# Patient Record
Sex: Female | Born: 1950 | Race: White | Hispanic: No | Smoking: Never smoker
Health system: Southern US, Community
[De-identification: ages and names within clinical notes are randomized; demographics above are authoritative.]

## PROBLEM LIST (undated history)

## (undated) DIAGNOSIS — R7303 Prediabetes: Secondary | ICD-10-CM

## (undated) DIAGNOSIS — I10 Essential (primary) hypertension: Secondary | ICD-10-CM

## (undated) DIAGNOSIS — E785 Hyperlipidemia, unspecified: Secondary | ICD-10-CM

## (undated) HISTORY — PX: TONSILLECTOMY: SUR1361

## (undated) HISTORY — DX: Essential (primary) hypertension: I10

---

## 2012-10-10 ENCOUNTER — Emergency Department: Payer: Self-pay | Admitting: Emergency Medicine

## 2012-10-10 LAB — BASIC METABOLIC PANEL
Anion Gap: 8 (ref 7–16)
BUN: 12 mg/dL (ref 7–18)
Calcium, Total: 8.9 mg/dL (ref 8.5–10.1)
Co2: 26 mmol/L (ref 21–32)
EGFR (African American): >60
Osmolality: 280 (ref 275–301)
Potassium: 3.5 mmol/L (ref 3.5–5.1)

## 2012-10-10 LAB — CBC WITH DIFFERENTIAL/PLATELET
Basophil #: 0 10*3/uL (ref 0.0–0.1)
Basophil %: 0.3 %
Eosinophil %: 0.1 %
HCT: 34.2 % — ABNORMAL LOW (ref 35.0–47.0)
Lymphocyte #: 1.3 10*3/uL (ref 1.0–3.6)
Lymphocyte %: 45.9 %
MCH: 29.4 pg (ref 26.0–34.0)
MCHC: 34.6 g/dL (ref 32.0–36.0)
MCV: 85 fL (ref 80–100)
Monocyte #: 0.4 x10 3/mm (ref 0.2–0.9)
Monocyte %: 14.9 %
Platelet: 171 10*3/uL (ref 150–440)
RDW: 15.6 % — ABNORMAL HIGH (ref 11.5–14.5)

## 2014-06-01 NOTE — Consult Note (Signed)
Brief Consult Note: Diagnosis: rt ankle ulcer.   Patient was seen by consultant.   Consult note dictated.   Recommend further assessment or treatment.   Discussed with Attending MD.   Comments: chronic ulcer, probable venous stasis no evidence of active infection, possibly colonized no need for antibiotics rec silvadene dressing BID, see later in week unless admitted for w/u of neutropenia needs IM w/u for leukopenia.  Electronic Signatures: Lattie Hawooper, Lalita Ebel E (MD)  (Signed 01-Sep-14 19:45)  Authored: Brief Consult Note   Last Updated: 01-Sep-14 19:45 by Lattie Hawooper, Maleea Camilo E (MD)

## 2014-06-01 NOTE — Consult Note (Signed)
PATIENT NAME:  Erika Perry, Erika Perry MR#:  [id number] DATE OF BIRTH:  1950/03/29  DATE OF CONSULTATION:  10/10/2012  CONSULTING PHYSICIAN:  Adah Salvageichard E. Excell Seltzerooper, MD  CHIEF COMPLAINT: Right ankle wound.   HISTORY OF PRESENT ILLNESS: This is a 64 year old female patient with a BMI of 37, who presents with over one month of a right ankle wound. When asked why she came in today, it was because her family insisting on her coming to the ER for evaluation of this chronic wound. She denies any fevers or chills, has not had any pain associated with this and was placed on both Keflex and Bactrim by an urgent care visit earlier in the week. She has finished those antibiotics and has seen no improvement.   The Emergency Room physician was concerned about an active infection and leukopenia. The patient does not see a physician regularly and therefore has no reported medical problems and does not take any medicines other than the antibiotics that she just completed. She is nondiabetic, but again does not see physicians regularly.   ALLERGIES: None.   MEDICATIONS: Recent completion of a regimen of Keflex and Bactrim.   FAMILY HISTORY: Noncontributory.   SOCIAL HISTORY: The patient lives at home.   REVIEW OF SYSTEMS:  A 10-system review was performed and negative with the exception of that mentioned in the history of present illness.   PHYSICAL EXAMINATION: GENERAL: Healthy, comfortable-appearing obese female patient, BMI 37 200 pounds, 62 inches tall.  VITAL SIGNS: Temperature of 98, pulse 77, respirations 16, blood pressure 160/76. The ER of notes suggested pain scale of five; however, she did not report any pain to me.  HEENT: No scleral icterus.  INTEGUMENT: No jaundice.  EXTREMITIES: Show multiple venous stasis changes with varicosities on both legs, edema on both legs, very symmetric in nature, nontender calves. Right lower extremity demonstrates approximately 4 x 5 cm granulating, chronic appearing wound on  the medial portion posterior to the ankle. It is superficial. There is no purulence. No erythema except right at the wound edge. No sign of cellulitis, nontender. Granulation is present and no purulence is noted. Full range of motion was noted.   LABORATORY DATA:  Electrolytes are within normal limits. Hemoglobin and hematocrit is 11.8 and 34.2. White blood cell count of 2.9 and a platelet count of 171.   ASSESSMENT AND PLAN: This is a patient with a chronic appearing wound in her right ankle. It has been at least a month with no preceding event. She is not sure of ever having been exposed to an arthropod bite and this is in all likelihood represents a venous stasis ulcer. There is no sign of cellulitis or active infection and I would not recommend continuing antibiotics at this point, as she has finished out a regimen of both Bactrim and Keflex. Currently I see no evidence for an active infection and would treat this with Silvadene b.i.Perry. and then re-evaluate the wound in the office next week.   However, the patient has a leukopenia on her evaluation today and a mild anemia as well. I spoke with the Emergency Room physician concerning work-up of this leukopenia. The patient does not see physicians and does not have insurance. Therefore, the concern is that this would never be properly evaluated and the Emergency Room physician will consider admission for work-up versus outpatient evaluation. Currently, she has no need for admission to the hospital with respect to the chronic granulating wound without sign of active infection. We will be  happy to see the patient in the office later on in the week to re-evaluate. This likely represents a chronic venous stasis ulcer, although there is no way to prove that this did not occur following an arthropod bite either. I expect this to take a very long time to heal if it is a chronic venous stasis ulcer, and she has considerable evidence of venous stasis changes in both  lower extremities with her obesity.   I discussed this with the Emergency Room physician. He will determine whether or not to work-up in the hospital or as an outpatient concerning her leukopenia and I reminded him and the patient and her family that I am not an internal medicine specialist in that work-up of her leukopenia would be outside of my area of expertise, but would be happy to take care of her wound as an outpatient if necessary or in the hospital if she is admitted. This plan was agreed upon by the patient as well as the Emergency Room physician.   ____________________________ Adah Salvage. Excell Seltzer, MD rec:cc Perry: 10/10/2012 19:52:56 ET T: 10/10/2012 20:35:00 ET JOB#: [id number]  cc: Adah Salvage. Excell Seltzer, MD, <Dictator> Lattie Haw MD ELECTRONICALLY SIGNED 10/10/2012 22:32

## 2016-02-28 ENCOUNTER — Emergency Department: Payer: Self-pay

## 2016-02-28 ENCOUNTER — Emergency Department
Admission: EM | Admit: 2016-02-28 | Discharge: 2016-02-28 | Disposition: A | Discharge status: Home / Self Care | Payer: Self-pay | Attending: Emergency Medicine | Admitting: Emergency Medicine

## 2016-02-28 ENCOUNTER — Encounter: Payer: Self-pay | Admitting: Emergency Medicine

## 2016-02-28 DIAGNOSIS — W010XXA Fall on same level from slipping, tripping and stumbling without subsequent striking against object, initial encounter: Secondary | ICD-10-CM | POA: Insufficient documentation

## 2016-02-28 DIAGNOSIS — Y999 Unspecified external cause status: Secondary | ICD-10-CM | POA: Insufficient documentation

## 2016-02-28 DIAGNOSIS — S83422A Sprain of lateral collateral ligament of left knee, initial encounter: Secondary | ICD-10-CM

## 2016-02-28 DIAGNOSIS — Y939 Activity, unspecified: Secondary | ICD-10-CM | POA: Insufficient documentation

## 2016-02-28 DIAGNOSIS — Y92481 Parking lot as the place of occurrence of the external cause: Secondary | ICD-10-CM | POA: Insufficient documentation

## 2016-02-28 DIAGNOSIS — S82832A Other fracture of upper and lower end of left fibula, initial encounter for closed fracture: Secondary | ICD-10-CM | POA: Insufficient documentation

## 2016-02-28 MED ORDER — HYDROCODONE-ACETAMINOPHEN 5-325 MG PO TABS
1.0000 | ORAL_TABLET | Freq: Once | ORAL | Status: AC
  Administered 2016-02-28: 1 via ORAL

## 2016-02-28 MED ORDER — HYDROCODONE-ACETAMINOPHEN 5-325 MG PO TABS
ORAL_TABLET | ORAL | Status: AC
  Filled 2016-02-28: qty 1

## 2016-02-28 MED ORDER — HYDROCODONE-ACETAMINOPHEN 5-325 MG PO TABS: 1.0000 | ORAL_TABLET | Freq: Four times a day (QID) | ORAL | 0 refills | Status: DC | PRN

## 2016-02-28 NOTE — ED Provider Notes (Signed)
[facility] Emergency Department Provider Note ____________________________________________  Time seen: 1724  I have reviewed the triage vital signs and the nursing notes.  HISTORY  Chief Complaint  Fall and Leg Pain  HPI Erika Perry is a 66 y.o. female presents to the ED for evaluation of lateral left knee pain s/p fall in her icy parking lot this morning. She worked the day, but noted increased pain with standing and walking. She localizes pain to the lateral joint line. She denies any other injury at this time. She has not taken any medicine for pain relief.   History reviewed. No pertinent past medical history.  There are no active problems to display for this patient.  No past surgical history on file.  Prior to Admission medications   Medication Sig Start Date End Date Taking? Authorizing Provider  HYDROcodone-acetaminophen (NORCO) 5-325 MG tablet Take 1 tablet by mouth every 6 (six) hours as needed. 02/28/16   Charlesetta Ivory Kamorie Aldous, PA-C    Allergies Patient has no known allergies.  No family history on file.  Social History Social History  Substance Use Topics  . Smoking status: Never Smoker  . Smokeless tobacco: Never Used  . Alcohol use No    Review of Systems  Constitutional: Negative for fever. Eyes: Negative for visual changes. ENT: Negative for sore throat. Cardiovascular: Negative for chest pain. Respiratory: Negative for shortness of breath. Gastrointestinal: Negative for abdominal pain, vomiting and diarrhea. Genitourinary: Negative for dysuria. Musculoskeletal: Negative for back pain. Skin: Negative for rash. Neurological: Negative for headaches, focal weakness or numbness. ____________________________________________  PHYSICAL EXAM:  VITAL SIGNS: ED Triage Vitals  Enc Vitals Group     BP 02/28/16 1556 (!) 182/78     Pulse Rate 02/28/16 1556 97     Resp 02/28/16 1556 20     Temp 02/28/16 1556 98.2 F (36.8 C)     Temp Source 02/28/16 1556 Oral     SpO2 02/28/16 1556 100 %     Weight 02/28/16 1554 160 lb (72.6 kg)     Height 02/28/16 1554 5\' 2"  (1.575 m)     Head Circumference --      Peak Flow --      Pain Score 02/28/16 1554 8     Pain Loc --      Pain Edu? --      Excl. in GC? --     Constitutional: Alert and oriented. Well appearing and in no distress. Head: Normocephalic and atraumatic. Cardiovascular: Normal rate, regular rhythm. Normal distal pulses. Respiratory: Normal respiratory effort. No wheezes/rales/rhonchi. Musculoskeletal: Left knee without obvious deformity, effusion, or dislocation. Patient with tenderness to palp at the lateral joint line. She is exquisitely tender to palp over the proximal fibular head. Normal ankle ROM without laxity. No popliteal space fullness. No calf or achilles tenderness. Nontender with normal range of motion in all extremities.  Neurologic:  Normal gait without ataxia. Normal speech and language. No gross focal neurologic deficits are appreciated. Skin:  Skin is warm, dry and intact. No rash noted. Psychiatric: Mood and affect are normal. Patient exhibits appropriate insight and judgment. ____________________________________________   RADIOLOGY  Left Knee IMPRESSION: Minimally displaced fracture of the proximal fibula.  I, Jessicaann Overbaugh, Charlesetta Ivory, personally viewed and evaluated these images (plain radiographs) as part of my medical decision making, as well as reviewing the written report by the radiologist. ____________________________________________  PROCEDURES  Knee immobilizer Jones compression wrap Norco 5-325 mg PO ____________________________________________  INITIAL  IMPRESSION / ASSESSMENT AND PLAN / ED COURSE  Patient with initial fracture care of a minimally displaced proximal fibula fracture. She is fitted with a compression wrap and knee immobilizer. She will be discharged with a prescription for Norco for pain relief. She may  take ibuprofen or naproxen for non-drowsy pain relief. Follow-up with Dr. Ernest PineHooten for further fracture management.   ____________________________________________  FINAL CLINICAL IMPRESSION(S) / ED DIAGNOSES  Final diagnoses:  Closed fracture fibula, head, left, initial encounter  Sprain of lateral collateral ligament of left knee, initial encounter      Lissa HoardJenise V Bacon Minnie Shi, PA-C 02/28/16 1836    Arnaldo NatalPaul F Malinda, MD 03/02/16 0127

## 2016-02-28 NOTE — ED Notes (Signed)
See triage note  States she slipped  Landed on left leg  Having pain to lateral aspect on left knee

## 2016-02-28 NOTE — Discharge Instructions (Signed)
Wear the knee immobilizer to ambulate. Rest with the leg elevated and apply ice to reduce pain and swelling. Take the pain medicine as directed. Take OTC ibuprofen or naproxen for pain and swelling. Follow-up with Dr. Ernest PineHooten for further fracture care next week.

## 2016-02-28 NOTE — ED Triage Notes (Signed)
Pt here with c/o left leg pain after a fall on ice today. Pt ambulatory to triage, refusing wheelchair.

## 2017-02-17 DIAGNOSIS — I10 Essential (primary) hypertension: Secondary | ICD-10-CM | POA: Diagnosis not present

## 2017-02-17 DIAGNOSIS — L97509 Non-pressure chronic ulcer of other part of unspecified foot with unspecified severity: Secondary | ICD-10-CM | POA: Diagnosis not present

## 2017-02-23 ENCOUNTER — Ambulatory Visit (INDEPENDENT_AMBULATORY_CARE_PROVIDER_SITE_OTHER): Payer: Medicare Other | Admitting: Vascular Surgery

## 2017-02-23 ENCOUNTER — Encounter (INDEPENDENT_AMBULATORY_CARE_PROVIDER_SITE_OTHER): Payer: Self-pay | Admitting: Vascular Surgery

## 2017-02-23 VITALS — BP 176/93 | HR 84 | Resp 16 | Ht 61.0 in | Wt 196.0 lb

## 2017-02-23 DIAGNOSIS — R600 Localized edema: Secondary | ICD-10-CM | POA: Insufficient documentation

## 2017-02-23 DIAGNOSIS — I10 Essential (primary) hypertension: Secondary | ICD-10-CM | POA: Insufficient documentation

## 2017-02-23 DIAGNOSIS — L97321 Non-pressure chronic ulcer of left ankle limited to breakdown of skin: Secondary | ICD-10-CM | POA: Diagnosis not present

## 2017-02-23 DIAGNOSIS — I83023 Varicose veins of left lower extremity with ulcer of ankle: Secondary | ICD-10-CM | POA: Diagnosis not present

## 2017-02-23 NOTE — Progress Notes (Signed)
Subjective:    Patient ID: Erika Perry, female    DOB: March 14, 1950, 67 y.o.   MRN: [id number] Chief Complaint  Patient presents with  . New Patient (Initial Visit)    Possible leg ulcer   Patient last seen in 2015 and treated for a right lower extremity medial ankle ulceration.  The patient endorses a history of undergoing weekly Unna boot changes for an ulcer to the right ankle.  The patient noted a small wound developing to the medial aspect of her left ankle around Christmas.  She has been treating it with Neosporin.  The wound has grown in size over the last two weeks and the patient is seeking medical attention.  At this time, the patient does not engage in conservative therapy including wearing medical grade one compression stockings and elevating her legs on a daily basis.  The patient denies any trauma or recent surgery to the area.  The patient denies any DVT history.  The patient does experience bilateral lower extremity edema.  Patient denies any drainage from the ulceration.  The patient denies any fever, nausea vomiting.   Review of Systems  Constitutional: Negative.   HENT: Negative.   Eyes: Negative.   Respiratory: Negative.   Cardiovascular: Positive for leg swelling.  Gastrointestinal: Negative.   Endocrine: Negative.   Genitourinary: Negative.   Musculoskeletal: Negative.   Skin: Positive for wound.  Allergic/Immunologic: Negative.   Neurological: Negative.   Hematological: Negative.   Psychiatric/Behavioral: Negative.       Objective:   Physical Exam  Constitutional: She is oriented to person, place, and time. She appears well-developed and well-nourished. No distress.  HENT:  Head: Normocephalic and atraumatic.  Eyes: Conjunctivae are normal. Pupils are equal, round, and reactive to light.  Neck: Normal range of motion.  Cardiovascular: Normal rate, regular rhythm, normal heart sounds and intact distal pulses.  Pulses:      Radial pulses are 2+ on the  right side, and 2+ on the left side.  Hard to palpate pedal pulses due to edema however her bilateral feet are warm  Pulmonary/Chest: Effort normal and breath sounds normal.  Musculoskeletal: Normal range of motion. She exhibits edema (Mild to moderate 1+ pitting edema noted bilaterally left greater than right.).  Neurological: She is alert and oriented to person, place, and time.  Skin: She is not diaphoretic.  3 cm x 3 cm shallow noninfected ulceration limited to breakdown of skin noted to the left medial ankle.  No drainage.  No cellulitis.  Surrounding skin is healthy.  No necrotic tissue.  Psychiatric: She has a normal mood and affect. Her behavior is normal. Judgment and thought content normal.  Vitals reviewed.  BP (!) 176/93 (BP Location: Right Arm, Patient Position: Sitting)   Pulse 84   Resp 16   Ht 5\' 1"  (1.549 m)   Wt 196 lb (88.9 kg)   BMI 37.03 kg/m   Past Medical History:  Diagnosis Date  . Hypertension    Social History   Socioeconomic History  . Marital status: Single    Spouse name: Not on file  . Number of children: Not on file  . Years of education: Not on file  . Highest education level: Not on file  Social Needs  . Financial resource strain: Not on file  . Food insecurity - worry: Not on file  . Food insecurity - inability: Not on file  . Transportation needs - medical: Not on file  . Transportation needs -  non-medical: Not on file  Occupational History  . Not on file  Tobacco Use  . Smoking status: Never Smoker  . Smokeless tobacco: Never Used  Substance and Sexual Activity  . Alcohol use: No  . Drug use: Not on file  . Sexual activity: Not on file  Other Topics Concern  . Not on file  Social History Narrative  . Not on file   History reviewed. No pertinent surgical history.  Family History  Problem Relation Age of Onset  . Cancer Mother   . Diabetes Mother    Allergies  Allergen Reactions  . Bactrim [Sulfamethoxazole-Trimethoprim]      Nausea, no appitite      Assessment & Plan:  Patient last seen in 2015 and treated for a right lower extremity medial ankle ulceration.  The patient endorses a history of undergoing weekly Unna boot changes for an ulcer to the right ankle.  The patient noted a small wound developing to the medial aspect of her left ankle around Christmas.  She has been treating it with Neosporin.  The wound has grown in size over the last two weeks and the patient is seeking medical attention.  At this time, the patient does not engage in conservative therapy including wearing medical grade one compression stockings and elevating her legs on a daily basis.  The patient denies any trauma or recent surgery to the area.  The patient denies any DVT history.  The patient does experience bilateral lower extremity edema.  Patient denies any drainage from the ulceration.  The patient denies any fever, nausea vomiting.  1. Essential hypertension - Stable Encouraged good control as its slows the progression of atherosclerotic disease  2. 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 The patient has not been engaging in conservative therapy and presents today with a left medial ankle ulceration. Recommend Unna wraps to the bilateral lower extremity to control the patient's edema and assist in healing The patient was encouraged to elevate her legs heart level or higher Patient is to follow-up in 1 month to assess her progress with bilateral Unna boots The patient understands that once her her edema is controlled I will transition her from Unna boots to compression stockings The patient is to undergo bilateral venous duplex to rule out any contributing venous disease in 1 month  - VAS US LOWER EXTREMITY VENOUS REFLUX; Future  3. Bilateral lower extremity  edema - New As above  Current Outpatient Medications on File Prior to Visit  Medication Sig Dispense Refill  . HYDROcodone-acetaminophen (NORCO) 5-325 MG tablet Take 1 tablet by mouth every 6 (six) hours as needed. (Patient not taking: Reported on 02/23/2017) 15 tablet 0  . losartan (COZAAR) 50 MG tablet TK 1 T PO D  0  . traMADol (ULTRAM) 50 MG tablet Take by mouth.     No current facility-administered medications on file prior to visit.    There are no Patient Instructions on file for this visit. Return in about 1 month (around 03/26/2017), or if symptoms worsen or fail to improve.  Kimani Bedoya A Tanyiah Laurich, PA-C

## 2017-02-25 ENCOUNTER — Other Ambulatory Visit (INDEPENDENT_AMBULATORY_CARE_PROVIDER_SITE_OTHER): Payer: Self-pay

## 2017-02-25 ENCOUNTER — Telehealth (INDEPENDENT_AMBULATORY_CARE_PROVIDER_SITE_OTHER): Payer: Self-pay

## 2017-02-25 MED ORDER — TRAMADOL HCL 50 MG PO TABS: 50.0000 mg | ORAL_TABLET | Freq: Two times a day (BID) | ORAL | 0 refills | Status: DC

## 2017-02-25 NOTE — Telephone Encounter (Signed)
Tramadol 50mg  1 tab BID #40 with no additional refills was sent into the patient's pharmacy electronically today.

## 2017-02-25 NOTE — Telephone Encounter (Signed)
Patient is calling to see if there is something that can be called in for her left foot pain?    [facility] in [place].

## 2017-02-25 NOTE — Telephone Encounter (Signed)
As I discussed with Erika Perry - she can have tramadol. She is already taking this as per her med rec. We do not prescribe narcotics for unna wraps. If she wants narcotics she would have to ask her PCP.

## 2017-03-01 ENCOUNTER — Telehealth (INDEPENDENT_AMBULATORY_CARE_PROVIDER_SITE_OTHER): Payer: Self-pay

## 2017-03-01 NOTE — Telephone Encounter (Signed)
Patient called to see if we had ordered a refill on her pain medications yet?  The refill was done on 02/25/17 [facility] [place]  She was going to call to see if they had gotten the refill.

## 2017-03-02 ENCOUNTER — Ambulatory Visit (INDEPENDENT_AMBULATORY_CARE_PROVIDER_SITE_OTHER): Payer: Medicare Other | Admitting: Vascular Surgery

## 2017-03-02 ENCOUNTER — Encounter (INDEPENDENT_AMBULATORY_CARE_PROVIDER_SITE_OTHER): Payer: Self-pay | Admitting: Vascular Surgery

## 2017-03-02 VITALS — BP 152/87 | HR 80 | Resp 16

## 2017-03-02 DIAGNOSIS — L97321 Non-pressure chronic ulcer of left ankle limited to breakdown of skin: Secondary | ICD-10-CM

## 2017-03-02 DIAGNOSIS — R600 Localized edema: Secondary | ICD-10-CM | POA: Diagnosis not present

## 2017-03-02 DIAGNOSIS — I83023 Varicose veins of left lower extremity with ulcer of ankle: Secondary | ICD-10-CM

## 2017-03-02 NOTE — Progress Notes (Signed)
History of Present Illness  There is no documented history at this time  Assessments & Plan   There are no diagnoses linked to this encounter.    Additional instructions  Subjective:  Patient presents with venous ulcer of the Left lower extremity.    Procedure:  3 layer unna wrap was placed Left lower extremity.   Plan:   Follow up in one week.  

## 2017-03-09 ENCOUNTER — Ambulatory Visit (INDEPENDENT_AMBULATORY_CARE_PROVIDER_SITE_OTHER): Payer: Medicare Other | Admitting: Vascular Surgery

## 2017-03-09 ENCOUNTER — Encounter (INDEPENDENT_AMBULATORY_CARE_PROVIDER_SITE_OTHER): Payer: Self-pay

## 2017-03-09 VITALS — BP 151/75 | HR 86 | Resp 16 | Wt 194.0 lb

## 2017-03-09 DIAGNOSIS — L97321 Non-pressure chronic ulcer of left ankle limited to breakdown of skin: Secondary | ICD-10-CM | POA: Diagnosis not present

## 2017-03-09 DIAGNOSIS — I83023 Varicose veins of left lower extremity with ulcer of ankle: Secondary | ICD-10-CM

## 2017-03-09 NOTE — Progress Notes (Signed)
History of Present Illness  There is no documented history at this time  Assessments & Plan   There are no diagnoses linked to this encounter.    Additional instructions  Subjective:  Patient presents with venous ulcer of the Bilateral lower extremity.    Procedure:  3 layer unna wrap was placed Bilateral lower extremity.   Plan:   Follow up in one week.  

## 2017-03-10 ENCOUNTER — Encounter (INDEPENDENT_AMBULATORY_CARE_PROVIDER_SITE_OTHER): Payer: Medicare Other

## 2017-03-16 ENCOUNTER — Ambulatory Visit (INDEPENDENT_AMBULATORY_CARE_PROVIDER_SITE_OTHER): Payer: Medicare Other | Admitting: Vascular Surgery

## 2017-03-16 ENCOUNTER — Encounter (INDEPENDENT_AMBULATORY_CARE_PROVIDER_SITE_OTHER): Payer: Self-pay

## 2017-03-16 VITALS — BP 154/90 | HR 75 | Resp 17 | Ht 66.0 in | Wt 193.0 lb

## 2017-03-16 DIAGNOSIS — I83023 Varicose veins of left lower extremity with ulcer of ankle: Secondary | ICD-10-CM

## 2017-03-16 DIAGNOSIS — L97321 Non-pressure chronic ulcer of left ankle limited to breakdown of skin: Secondary | ICD-10-CM | POA: Diagnosis not present

## 2017-03-16 NOTE — Progress Notes (Signed)
History of Present Illness  There is no documented history at this time  Assessments & Plan   There are no diagnoses linked to this encounter.    Additional instructions  Subjective:  Patient presents with venous ulcer of the Bilateral lower extremity.    Procedure:  3 layer unna wrap was placed Bilateral lower extremity.   Plan:   Follow up in one week.  

## 2017-03-17 ENCOUNTER — Encounter (INDEPENDENT_AMBULATORY_CARE_PROVIDER_SITE_OTHER): Payer: Self-pay | Admitting: Vascular Surgery

## 2017-03-20 ENCOUNTER — Other Ambulatory Visit (INDEPENDENT_AMBULATORY_CARE_PROVIDER_SITE_OTHER): Payer: Self-pay | Admitting: Vascular Surgery

## 2017-03-24 ENCOUNTER — Ambulatory Visit (INDEPENDENT_AMBULATORY_CARE_PROVIDER_SITE_OTHER): Payer: Medicare Other | Admitting: Vascular Surgery

## 2017-03-24 ENCOUNTER — Encounter (INDEPENDENT_AMBULATORY_CARE_PROVIDER_SITE_OTHER): Payer: Self-pay | Admitting: Vascular Surgery

## 2017-03-24 ENCOUNTER — Ambulatory Visit (INDEPENDENT_AMBULATORY_CARE_PROVIDER_SITE_OTHER): Payer: Medicare Other

## 2017-03-24 VITALS — BP 169/85 | HR 82 | Resp 18 | Ht 66.0 in | Wt 195.0 lb

## 2017-03-24 DIAGNOSIS — I83023 Varicose veins of left lower extremity with ulcer of ankle: Secondary | ICD-10-CM

## 2017-03-24 DIAGNOSIS — L97321 Non-pressure chronic ulcer of left ankle limited to breakdown of skin: Secondary | ICD-10-CM | POA: Diagnosis not present

## 2017-03-24 DIAGNOSIS — I890 Lymphedema, not elsewhere classified: Secondary | ICD-10-CM

## 2017-03-24 DIAGNOSIS — I872 Venous insufficiency (chronic) (peripheral): Secondary | ICD-10-CM | POA: Insufficient documentation

## 2017-03-24 MED ORDER — TRAMADOL HCL 50 MG PO TABS: 50.0000 mg | ORAL_TABLET | Freq: Four times a day (QID) | ORAL | 1 refills | Status: DC | PRN

## 2017-03-24 NOTE — Progress Notes (Signed)
Subjective:    Patient ID: Erika Perry, female    DOB: [birth year], 67 y.o.   MRN: [id number] Chief Complaint  Patient presents with  . Follow-up    Unna check and bilateral venous reflux study   Patient presents to review vascular studies.  The patient has been receiving bilateral three layer zinc oxide unna wraps for lower extremity edema with medial ankle ulceration.  There has been minimal improvement to her edema.  The ulceration located to the left medial ankle has healed.  The ulceration to the right medial ankle continues to heal.  The patient notes discomfort to the bilateral medial ankles while in a Unna wraps.  The patient states that she elevates her legs heart level or higher on a daily basis.  The patient underwent a bilateral lower venous reflux study which was notable for reflux in the right popliteal vein, right greater saphenous vein, and right small saphenous vein.  There is no evidence of deep vein or superficial thrombophlebitis to the right lower extremity.  The patient was noted to have reflux in the left popliteal vein, left great saphenous vein and left small saphenous vein.  There is no evidence of deep vein or superficial thrombophlebitis to the left lower extremity.  The patient denies any recent bouts of cellulitis bilaterally.  The patient denies any fever, nausea vomiting.   Review of Systems  Constitutional: Negative.   HENT: Negative.   Eyes: Negative.   Respiratory: Negative.   Cardiovascular: Positive for leg swelling.  Gastrointestinal: Negative.   Endocrine: Negative.   Genitourinary: Negative.   Musculoskeletal: Negative.   Skin: Positive for wound.  Allergic/Immunologic: Negative.   Neurological: Negative.   Hematological: Negative.   Psychiatric/Behavioral: Negative.       Objective:   Physical Exam  Constitutional: She is oriented to person, place, and time. She appears well-developed and well-nourished. No distress.  HENT:  Head:  Normocephalic and atraumatic.  Eyes: Conjunctivae are normal. Pupils are equal, round, and reactive to light.  Neck: Normal range of motion.  Cardiovascular: Normal rate, regular rhythm, normal heart sounds and intact distal pulses.  Pulses:      Radial pulses are 2+ on the right side, and 2+ on the left side.  To palpate pedal pulses however the bilateral feet are warm  Pulmonary/Chest: Effort normal and breath sounds normal.  Musculoskeletal: Normal range of motion. She exhibits edema (Mild to moderate nonpitting edema noted bilaterally).  Neurological: She is alert and oriented to person, place, and time.  Skin: She is not diaphoretic.  Medial ankle: 2 cm x 2 cm shallow noninfected ulceration.  There is no surrounding erythema.  There is no cellulitis to the area.  There is no drainage.  Psychiatric: She has a normal mood and affect. Her behavior is normal. Judgment and thought content normal.  Vitals reviewed.  BP (!) 169/85 (BP Location: Right Arm, Patient Position: Sitting)   Pulse 82   Resp 18   Ht 5\' 6"  (1.676 m)   Wt 195 lb (88.5 kg)   BMI 31.47 kg/m   Past Medical History:  Diagnosis Date  . Hypertension    Social History   Socioeconomic History  . Marital status: Single    Spouse name: Not on file  . Number of children: Not on file  . Years of education: Not on file  . Highest education level: Not on file  Social Needs  . Financial resource strain: Not on file  . Food insecurity -  worry: Not on file  . Food insecurity - inability: Not on file  . Transportation needs - medical: Not on file  . Transportation needs - non-medical: Not on file  Occupational History  . Not on file  Tobacco Use  . Smoking status: Never Smoker  . Smokeless tobacco: Never Used  Substance and Sexual Activity  . Alcohol use: No  . Drug use: Not on file  . Sexual activity: Not on file  Other Topics Concern  . Not on file  Social History Narrative  . Not on file   History  reviewed. No pertinent surgical history.  Family History  Problem Relation Age of Onset  . Cancer Mother   . Diabetes Mother    Allergies  Allergen Reactions  . Bactrim [Sulfamethoxazole-Trimethoprim]     Nausea, no appitite      Assessment & Plan:  Patient presents to review vascular studies.  The patient has been receiving bilateral three layer zinc oxide unna wraps for lower extremity edema with medial ankle ulceration.  There has been minimal improvement to her edema.  The ulceration located to the left medial ankle has healed.  The ulceration to the right medial ankle continues to heal.  The patient notes discomfort to the bilateral medial ankles while in a Unna wraps.  The patient states that she elevates her legs heart level or higher on a daily basis.  The patient underwent a bilateral lower venous reflux study which was notable for reflux in the right popliteal vein, right greater saphenous vein, and right small saphenous vein.  There is no evidence of deep vein or superficial thrombophlebitis to the right lower extremity.  The patient was noted to have reflux in the left popliteal vein, left great saphenous vein and left small saphenous vein.  There is no evidence of deep vein or superficial thrombophlebitis to the left lower extremity.  The patient denies any recent bouts of cellulitis bilaterally.  The patient denies any fever, nausea vomiting.  1. 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 Chronic venous insufficiency -  New Patient with bilateral lower extremity venous reflux The patient is likely to benefit from endovenous laser ablation. I have discussed the risks and benefits of the procedure. The risks primarily include DVT, recanalization, bleeding, infection, and inability to gain access. I will bring the patient back at the three-month mark to discuss possibly moving forward with laser ablation  3. Lymphedema - New Despite conservative treatments including exercise, elevation and bilateral 3 layer zinc oxide Unna wraps the patient still presents with stage II lymphedema The patient would greatly benefit from the added therapy of a lymphedema pump Applied to the patient's insurance The patient is to continue undergoing weekly bilateral zinc oxide Unna wraps Patient to follow-up in 1 month  Current Outpatient Medications on File Prior to Visit  Medication Sig Dispense Refill  . HYDROcodone-acetaminophen (NORCO) 5-325 MG tablet Take 1 tablet by mouth every 6 (six) hours as needed. (Patient not taking: Reported on 02/23/2017) 15 tablet 0  . losartan (COZAAR) 50  MG tablet TK 1 T PO D  0  . traMADol (ULTRAM) 50 MG tablet Take 1 tablet (50 mg total) by mouth 2 (two) times daily. 40 tablet 0   No current facility-administered medications on file prior to visit.    There are no Patient Instructions on file for this visit. No Follow-up on file.  Morry Veiga A Axyl Sitzman, PA-C

## 2017-03-31 ENCOUNTER — Ambulatory Visit (INDEPENDENT_AMBULATORY_CARE_PROVIDER_SITE_OTHER): Payer: Medicare Other | Admitting: Vascular Surgery

## 2017-03-31 ENCOUNTER — Encounter (INDEPENDENT_AMBULATORY_CARE_PROVIDER_SITE_OTHER): Payer: Self-pay

## 2017-03-31 VITALS — BP 184/90 | HR 90 | Resp 17 | Wt 195.0 lb

## 2017-03-31 DIAGNOSIS — I83023 Varicose veins of left lower extremity with ulcer of ankle: Secondary | ICD-10-CM

## 2017-03-31 DIAGNOSIS — L97321 Non-pressure chronic ulcer of left ankle limited to breakdown of skin: Secondary | ICD-10-CM | POA: Diagnosis not present

## 2017-03-31 NOTE — Progress Notes (Signed)
History of Present Illness  There is no documented history at this time  Assessments & Plan   There are no diagnoses linked to this encounter.    Additional instructions  Subjective:  Patient presents with venous ulcer of the Bilateral lower extremity.    Procedure:  3 layer unna wrap was placed Bilateral lower extremity.   Plan:   Follow up in one week.  

## 2017-04-07 ENCOUNTER — Encounter (INDEPENDENT_AMBULATORY_CARE_PROVIDER_SITE_OTHER): Payer: Self-pay

## 2017-04-07 ENCOUNTER — Ambulatory Visit (INDEPENDENT_AMBULATORY_CARE_PROVIDER_SITE_OTHER): Payer: Medicare Other | Admitting: Vascular Surgery

## 2017-04-07 VITALS — BP 167/80 | HR 82 | Resp 17 | Wt 194.8 lb

## 2017-04-07 DIAGNOSIS — L97321 Non-pressure chronic ulcer of left ankle limited to breakdown of skin: Secondary | ICD-10-CM

## 2017-04-07 DIAGNOSIS — I872 Venous insufficiency (chronic) (peripheral): Secondary | ICD-10-CM

## 2017-04-07 DIAGNOSIS — I83023 Varicose veins of left lower extremity with ulcer of ankle: Secondary | ICD-10-CM

## 2017-04-07 DIAGNOSIS — I890 Lymphedema, not elsewhere classified: Secondary | ICD-10-CM

## 2017-04-07 NOTE — Progress Notes (Signed)
History of Present Illness  There is no documented history at this time  Assessments & Plan   There are no diagnoses linked to this encounter.    Additional instructions  Subjective:  Patient presents with venous ulcer of the Bilateral lower extremity.    Procedure:  3 layer unna wrap was placed Bilateral lower extremity.   Plan:   Follow up in one week.  

## 2017-04-14 ENCOUNTER — Ambulatory Visit (INDEPENDENT_AMBULATORY_CARE_PROVIDER_SITE_OTHER): Payer: Medicare Other | Admitting: Vascular Surgery

## 2017-04-14 ENCOUNTER — Encounter (INDEPENDENT_AMBULATORY_CARE_PROVIDER_SITE_OTHER): Payer: Self-pay

## 2017-04-14 VITALS — BP 164/86 | HR 90 | Resp 17 | Wt 194.0 lb

## 2017-04-14 DIAGNOSIS — I83023 Varicose veins of left lower extremity with ulcer of ankle: Secondary | ICD-10-CM | POA: Diagnosis not present

## 2017-04-14 DIAGNOSIS — L97321 Non-pressure chronic ulcer of left ankle limited to breakdown of skin: Secondary | ICD-10-CM | POA: Diagnosis not present

## 2017-04-14 NOTE — Progress Notes (Signed)
History of Present Illness  There is no documented history at this time  Assessments & Plan   There are no diagnoses linked to this encounter.    Additional instructions  Subjective:  Patient presents with venous ulcer of the Bilateral lower extremity.    Procedure:  3 layer unna wrap was placed Bilateral lower extremity.   Plan:   Follow up in one week.  

## 2017-04-21 ENCOUNTER — Ambulatory Visit (INDEPENDENT_AMBULATORY_CARE_PROVIDER_SITE_OTHER): Payer: Medicare Other | Admitting: Vascular Surgery

## 2017-04-21 ENCOUNTER — Encounter (INDEPENDENT_AMBULATORY_CARE_PROVIDER_SITE_OTHER): Payer: Self-pay | Admitting: Vascular Surgery

## 2017-04-21 VITALS — BP 158/76 | HR 84 | Resp 18 | Wt 194.0 lb

## 2017-04-21 DIAGNOSIS — I890 Lymphedema, not elsewhere classified: Secondary | ICD-10-CM

## 2017-04-21 DIAGNOSIS — I83023 Varicose veins of left lower extremity with ulcer of ankle: Secondary | ICD-10-CM | POA: Diagnosis not present

## 2017-04-21 DIAGNOSIS — L97321 Non-pressure chronic ulcer of left ankle limited to breakdown of skin: Secondary | ICD-10-CM | POA: Diagnosis not present

## 2017-04-21 DIAGNOSIS — I872 Venous insufficiency (chronic) (peripheral): Secondary | ICD-10-CM

## 2017-04-21 NOTE — Progress Notes (Signed)
Subjective:    Patient ID: Erika Perry, female    DOB: 07/19/1950, [age] y.o.   MRN: [id number] Chief Complaint  Patient presents with  . Follow-up    unna check   Patient presents today for a monthly Unna boot/bilateral medial ankle wound follow-up.  The patient has been in 3 layers of zinc oxide and wraps to the bilateral lower extremity for approximately 2 months due to medial ankle ulcerations.  The patient notes an improvement to her edema and slow but steady healing to the bilateral ulcerations.  The patient underwent a bilateral venous reflux duplex last month which was notable for reflux in the right popliteal vein, right greater saphenous vein, and right small saphenous vein.  The patient was noted to have reflux in the left popliteal vein, left great saphenous vein and left small saphenous vein.  The patient notes she is elevating her legs her levels are higher as much as possible.  The patient notes that she has been as active as possible.  The patient denies any fever, nausea or vomiting.   Review of Systems  Constitutional: Negative.   HENT: Negative.   Eyes: Negative.   Respiratory: Negative.   Cardiovascular: Positive for leg swelling.  Gastrointestinal: Negative.   Endocrine: Negative.   Genitourinary: Negative.   Musculoskeletal: Negative.   Skin: Positive for wound.  Allergic/Immunologic: Negative.   Neurological: Negative.   Hematological: Negative.   Psychiatric/Behavioral: Negative.       Objective:   Physical Exam  Constitutional: She is oriented to person, place, and time. She appears well-developed and well-nourished. No distress.  HENT:  Head: Normocephalic and atraumatic.  Eyes: Conjunctivae are normal. Pupils are equal, round, and reactive to light.  Neck: Normal range of motion.  Cardiovascular: Normal rate, regular rhythm, normal heart sounds and intact distal pulses.  Pulses:      Radial pulses are 2+ on the right side, and 2+ on the left side.       Dorsalis pedis pulses are 2+ on the right side, and 2+ on the left side.       Posterior tibial pulses are 2+ on the right side, and 2+ on the left side.  Pulmonary/Chest: Effort normal and breath sounds normal.  Musculoskeletal: Normal range of motion. She exhibits edema (Mild to moderate nonpitting bilateral lower extremity edema noted).  Neurological: She is alert and oriented to person, place, and time.  Skin: She is not diaphoretic.  Right medial ankle ulceration: 1 cm x 1 cm shallow healthy wound bed full of granulation tissue ulceration noted to the right medial ankle.  There is no drainage.  There is no cellulitis. Left medial ankle ulceration: Almost fully healed very small area of irritation noted.  Cellulitis to the extremity.  Psychiatric: She has a normal mood and affect. Her behavior is normal. Judgment and thought content normal.  Vitals reviewed.  BP (!) 158/76 (BP Location: Right Arm)   Pulse 84   Resp 18   Wt 194 lb (88 kg)   BMI 31.31 kg/m   Past Medical History:  Diagnosis Date  . Hypertension    Social History   Socioeconomic History  . Marital status: Single    Spouse name: Not on file  . Number of children: Not on file  . Years of education: Not on file  . Highest education level: Not on file  Social Needs  . Financial resource strain: Not on file  . Food insecurity - worry: Not on  file  . Food insecurity - inability: Not on file  . Transportation needs - medical: Not on file  . Transportation needs - non-medical: Not on file  Occupational History  . Not on file  Tobacco Use  . Smoking status: Never Smoker  . Smokeless tobacco: Never Used  Substance and Sexual Activity  . Alcohol use: No  . Drug use: Not on file  . Sexual activity: Not on file  Other Topics Concern  . Not on file  Social History Narrative  . Not on file   No past surgical history on file.  Family History  Problem Relation Age of Onset  . Cancer Mother   . Diabetes  Mother     Allergies  Allergen Reactions  . Bactrim [Sulfamethoxazole-Trimethoprim]     Nausea, no appitite      Assessment & Plan:  Patient presents today for a monthly Unna boot/bilateral medial ankle wound follow-up.  The patient has been in 3 layers of zinc oxide and wraps to the bilateral lower extremity for approximately 2 months due to medial ankle ulcerations.  The patient notes an improvement to her edema and slow but steady healing to the bilateral ulcerations.  The patient underwent a bilateral venous reflux duplex last month which was notable for reflux in the right popliteal vein, right greater saphenous vein, and right small saphenous vein.  The patient was noted to have reflux in the left popliteal vein, left great saphenous vein and left small saphenous vein.  The patient notes she is elevating her legs her levels are higher as much as possible.  The patient notes that she has been as active as possible.  The patient denies any fever, nausea or vomiting.  1. 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 The patient is encouraged to continue elevating her legs on a daily basis Patient is encouraged to remain active The patient would benefit from endovenous laser ablations of the bilateral great saphenous and small saphenous veins.  The patient is going to follow-up in 1 month to assess her progress with an additional month of Unna boot therapy.  At that time, we will discuss if she is willing to move forward with laser ablation.  We had a long discussion about it today and how it would help her and  hopefully prevent recurrent ulcerations in the future.  2. Chronic venous insufficiency - Stable As above  3. Lymphedema - Stable The patient may be a candidate for lymphedema pump in the future As above  Current Outpatient Medications on File Prior to Visit  Medication Sig Dispense Refill  . HYDROcodone-acetaminophen (NORCO) 5-325 MG tablet Take 1 tablet by mouth every 6 (six) hours as needed. 15 tablet 0  . losartan (COZAAR) 50 MG tablet TK 1 T PO D  0  . traMADol (ULTRAM) 50 MG tablet Take 1 tablet (50 mg total) by mouth every 6 (six) hours as needed for moderate pain or severe pain. 40 tablet 1  . traMADol (ULTRAM) 50 MG tablet Take 1 tablet (50 mg total) by mouth 2 (two) times daily. (Patient not taking: Reported on 04/14/2017) 40  tablet 0   No current facility-administered medications on file prior to visit.    There are no Patient Instructions on file for this visit. No Follow-up on file.  Odean Mcelwain A Dontavia Brand, PA-C

## 2017-04-28 ENCOUNTER — Ambulatory Visit (INDEPENDENT_AMBULATORY_CARE_PROVIDER_SITE_OTHER): Payer: Medicare Other | Admitting: Vascular Surgery

## 2017-04-28 ENCOUNTER — Encounter (INDEPENDENT_AMBULATORY_CARE_PROVIDER_SITE_OTHER): Payer: Self-pay

## 2017-04-28 VITALS — BP 152/84 | HR 86 | Resp 17

## 2017-04-28 DIAGNOSIS — I872 Venous insufficiency (chronic) (peripheral): Secondary | ICD-10-CM

## 2017-04-28 NOTE — Progress Notes (Signed)
History of Present Illness  There is no documented history at this time  Assessments & Plan   There are no diagnoses linked to this encounter.    Additional instructions  Subjective:  Patient presents with venous ulcer of the Bilateral lower extremity.    Procedure:  3 layer unna wrap was placed Bilateral lower extremity.   Plan:   Follow up in one week.  

## 2017-05-05 ENCOUNTER — Encounter (INDEPENDENT_AMBULATORY_CARE_PROVIDER_SITE_OTHER): Payer: Self-pay

## 2017-05-05 ENCOUNTER — Ambulatory Visit (INDEPENDENT_AMBULATORY_CARE_PROVIDER_SITE_OTHER): Payer: Medicare Other | Admitting: Vascular Surgery

## 2017-05-05 VITALS — BP 150/74 | HR 80 | Resp 14 | Ht 65.0 in | Wt 193.0 lb

## 2017-05-05 DIAGNOSIS — I83023 Varicose veins of left lower extremity with ulcer of ankle: Secondary | ICD-10-CM

## 2017-05-05 DIAGNOSIS — L97321 Non-pressure chronic ulcer of left ankle limited to breakdown of skin: Secondary | ICD-10-CM | POA: Diagnosis not present

## 2017-05-05 DIAGNOSIS — I872 Venous insufficiency (chronic) (peripheral): Secondary | ICD-10-CM

## 2017-05-05 NOTE — Progress Notes (Signed)
History of Present Illness  There is no documented history at this time  Assessments & Plan   There are no diagnoses linked to this encounter.    Additional instructions  Subjective:  Patient presents with venous ulcer of the Bilateral lower extremity.    Procedure:  3 layer unna wrap was placed Bilateral lower extremity.   Plan:   Follow up in one week.  

## 2017-05-06 ENCOUNTER — Encounter (INDEPENDENT_AMBULATORY_CARE_PROVIDER_SITE_OTHER): Payer: Self-pay | Admitting: Vascular Surgery

## 2017-05-12 ENCOUNTER — Ambulatory Visit (INDEPENDENT_AMBULATORY_CARE_PROVIDER_SITE_OTHER): Payer: Medicare Other | Admitting: Vascular Surgery

## 2017-05-12 ENCOUNTER — Encounter (INDEPENDENT_AMBULATORY_CARE_PROVIDER_SITE_OTHER): Payer: Self-pay

## 2017-05-12 VITALS — BP 148/79 | HR 83 | Resp 16 | Ht 65.0 in | Wt 193.0 lb

## 2017-05-12 DIAGNOSIS — I890 Lymphedema, not elsewhere classified: Secondary | ICD-10-CM | POA: Diagnosis not present

## 2017-05-12 DIAGNOSIS — I83023 Varicose veins of left lower extremity with ulcer of ankle: Secondary | ICD-10-CM

## 2017-05-12 DIAGNOSIS — L97321 Non-pressure chronic ulcer of left ankle limited to breakdown of skin: Secondary | ICD-10-CM

## 2017-05-12 NOTE — Progress Notes (Signed)
History of Present Illness  There is no documented history at this time  Assessments & Plan   There are no diagnoses linked to this encounter.    Additional instructions  Subjective:  Patient presents with venous ulcer of the Bilateral lower extremity.    Procedure:  3 layer unna wrap was placed Bilateral lower extremity.   Plan:   Follow up in one week.  

## 2017-05-19 ENCOUNTER — Encounter (INDEPENDENT_AMBULATORY_CARE_PROVIDER_SITE_OTHER): Payer: Self-pay | Admitting: Vascular Surgery

## 2017-05-19 ENCOUNTER — Ambulatory Visit (INDEPENDENT_AMBULATORY_CARE_PROVIDER_SITE_OTHER): Payer: Medicare Other | Admitting: Vascular Surgery

## 2017-05-19 VITALS — BP 148/85 | HR 82 | Resp 16 | Ht 61.0 in | Wt 192.0 lb

## 2017-05-19 DIAGNOSIS — I83023 Varicose veins of left lower extremity with ulcer of ankle: Secondary | ICD-10-CM | POA: Diagnosis not present

## 2017-05-19 DIAGNOSIS — L97321 Non-pressure chronic ulcer of left ankle limited to breakdown of skin: Secondary | ICD-10-CM

## 2017-05-19 DIAGNOSIS — I872 Venous insufficiency (chronic) (peripheral): Secondary | ICD-10-CM | POA: Diagnosis not present

## 2017-05-19 DIAGNOSIS — I890 Lymphedema, not elsewhere classified: Secondary | ICD-10-CM | POA: Diagnosis not present

## 2017-05-19 NOTE — Progress Notes (Signed)
Subjective:    Patient ID: Erika Perry, female    DOB: October 26, 1950, 67 y.o.   MRN: [id number] Chief Complaint  Patient presents with  . Follow-up    unna check   Patient presents for a monthly unna boot therapy / wound assessment follow-up.  The patient has been treated for approximately three months with 3 layer zinc oxide bilateral lower extremity unna boots for chronic venous insufficiency, lymphedema and venous ulcerations to the medial ankle.  The patient presents today with an improvement to her edema and her ulcerations are now healed.  The patient denies any new ulcer formation to either leg.  The patient denies any erythema to the bilateral lower extremity.  The patient denies any fever, nausea vomiting.  Review of Systems  Constitutional: Negative.   HENT: Negative.   Eyes: Negative.   Respiratory: Negative.   Cardiovascular:       Chronic venous insufficiency Lymphedema Venous ulceration  Gastrointestinal: Negative.   Endocrine: Negative.   Genitourinary: Negative.   Musculoskeletal: Negative.   Skin: Negative.   Allergic/Immunologic: Negative.   Neurological: Negative.   Hematological: Negative.   Psychiatric/Behavioral: Negative.       Objective:   Physical Exam  Constitutional: She is oriented to person, place, and time. She appears well-developed and well-nourished. No distress.  HENT:  Head: Normocephalic and atraumatic.  Eyes: Pupils are equal, round, and reactive to light. Conjunctivae are normal.  Neck: Normal range of motion.  Cardiovascular: Normal rate, regular rhythm, normal heart sounds and intact distal pulses.  Pulses:      Radial pulses are 2+ on the right side, and 2+ on the left side.       Dorsalis pedis pulses are 2+ on the right side, and 2+ on the left side.       Posterior tibial pulses are 2+ on the right side, and 2+ on the left side.  Pulmonary/Chest: Effort normal and breath sounds normal.  Musculoskeletal: Normal range of motion.  She exhibits no edema.  Neurological: She is alert and oriented to person, place, and time.  Skin: Skin is warm and dry. She is not diaphoretic.  Bilateral medial ankle ulcerations have healed  Psychiatric: She has a normal mood and affect. Her behavior is normal. Judgment and thought content normal.  Vitals reviewed.  BP (!) 148/85 (BP Location: Right Arm)   Pulse 82   Resp 16   Ht 5\' 1"  (1.549 m)   Wt 192 lb (87.1 kg)   BMI 36.28 kg/m   Past Medical History:  Diagnosis Date  . Hypertension    Social History   Socioeconomic History  . Marital status: Single    Spouse name: Not on file  . Number of children: Not on file  . Years of education: Not on file  . Highest education level: Not on file  Occupational History  . Not on file  Social Needs  . Financial resource strain: Not on file  . Food insecurity:    Worry: Not on file    Inability: Not on file  . Transportation needs:    Medical: Not on file    Non-medical: Not on file  Tobacco Use  . Smoking status: Never Smoker  . Smokeless tobacco: Never Used  Substance and Sexual Activity  . Alcohol use: No  . Drug use: Not on file  . Sexual activity: Not on file  Lifestyle  . Physical activity:    Days per week: Not on file  Minutes per session: Not on file  . Stress: Not on file  Relationships  . Social connections:    Talks on phone: Not on file    Gets together: Not on file    Attends religious service: Not on file    Active member of club or organization: Not on file    Attends meetings of clubs or organizations: Not on file    Relationship status: Not on file  . Intimate partner violence:    Fear of current or ex partner: Not on file    Emotionally abused: Not on file    Physically abused: Not on file    Forced sexual activity: Not on file  Other Topics Concern  . Not on file  Social History Narrative  . Not on file   No past surgical history on file.  Family History  Problem Relation Age of  Onset  . Cancer Mother   . Diabetes Mother     Allergies  Allergen Reactions  . Bactrim [Sulfamethoxazole-Trimethoprim]     Nausea, no appitite      Assessment & Plan:  Patient presents for a monthly unna boot therapy / wound assessment follow-up.  The patient has been treated for approximately three months with 3 layer zinc oxide bilateral lower extremity unna boots for chronic venous insufficiency, lymphedema and venous ulcerations to the medial ankle.  The patient presents today with an improvement to her edema and her ulcerations are now healed.  The patient denies any new ulcer formation to either leg.  The patient denies any erythema to the bilateral lower extremity.  The patient denies any fever, nausea vomiting.  1. 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 Chronic venous insufficiency - Stable 3 months of 3 layer zinc oxide Unna wrap therapy has now controlled the patient's edema and her bilateral medial ankle ulcerations have healed It is okay to transition the patient to medical grade compression socks The patient was encouraged to wear graduated compression stockings (20-30 mmHg) on a daily basis. The patient was instructed to begin wearing the stockings first thing in the morning and removing them in the evening. The patient was instructed specifically not to sleep in the stockings. Prescription given. Elevation during the day will be continued. The patient was found to have venous reflux in the bilateral great saphenous veins The patient is likely to benefit from endovenous laser ablation. I have discussed the risks and benefits of the procedure. The risks primarily include DVT, recanalization, bleeding, infection, and inability to gain access. I will applied to the patient's insurance for  endovenous laser ablation to the great saphenous veins as the patient has tried conservative therapy with minimal improvement to her edema /venous ulcer formation requiring 3 months of Unna boot therapy treatment. Will call the patient with insurance approval The patient was instructed to call the office in the interim if any worsening edema or ulcerations to the legs, feet or toes occurs. The patient expresses their understanding.  3. Lymphedema - Stable The patient may be a candidate for lymphedema pump in the future  Current Outpatient Medications on File Prior to Visit  Medication Sig Dispense Refill  . HYDROcodone-acetaminophen (NORCO) 5-325 MG tablet Take 1 tablet by mouth every 6 (six) hours as needed. (Patient not taking: Reported on 04/28/2017) 15 tablet  0  . losartan (COZAAR) 50 MG tablet TK 1 T PO D  0  . traMADol (ULTRAM) 50 MG tablet Take 1 tablet (50 mg total) by mouth 2 (two) times daily. (Patient not taking: Reported on 04/14/2017) 40 tablet 0  . traMADol (ULTRAM) 50 MG tablet Take 1 tablet (50 mg total) by mouth every 6 (six) hours as needed for moderate pain or severe pain. (Patient not taking: Reported on 04/28/2017) 40 tablet 1   No current facility-administered medications on file prior to visit.     There are no Patient Instructions on file for this visit. No follow-ups on file.   Zriyah Kopplin A King Pinzon, PA-C

## 2018-05-23 DIAGNOSIS — L03011 Cellulitis of right finger: Secondary | ICD-10-CM | POA: Diagnosis not present

## 2018-05-26 ENCOUNTER — Telehealth: Payer: Self-pay

## 2018-05-26 NOTE — Telephone Encounter (Signed)
Copied from CRM [id number]. Topic: General - Other >> May 26, 2018  8:20 AM Tamela Oddi wrote: Reason for CRM: Patient called to request to become a new patient of the practice.  Patient would like a call back to let her know if this is possible and when would be the first available date and time.  Please advise and call patient back at [phone]

## 2018-05-28 DIAGNOSIS — L03011 Cellulitis of right finger: Secondary | ICD-10-CM | POA: Diagnosis not present

## 2018-05-31 NOTE — Telephone Encounter (Signed)
NP appt scheduled and paperwork mailed out. Thank you!

## 2018-07-08 ENCOUNTER — Other Ambulatory Visit: Payer: Self-pay

## 2018-07-11 ENCOUNTER — Encounter: Payer: Self-pay | Admitting: Family Medicine

## 2018-07-11 ENCOUNTER — Ambulatory Visit (INDEPENDENT_AMBULATORY_CARE_PROVIDER_SITE_OTHER): Payer: Medicare Other | Admitting: Family Medicine

## 2018-07-11 ENCOUNTER — Other Ambulatory Visit: Payer: Self-pay

## 2018-07-11 VITALS — BP 162/88 | HR 83 | Temp 98.2°F | Resp 18 | Ht 62.0 in | Wt 198.0 lb

## 2018-07-11 DIAGNOSIS — Z1231 Encounter for screening mammogram for malignant neoplasm of breast: Secondary | ICD-10-CM | POA: Diagnosis not present

## 2018-07-11 DIAGNOSIS — R7301 Impaired fasting glucose: Secondary | ICD-10-CM

## 2018-07-11 DIAGNOSIS — Z1159 Encounter for screening for other viral diseases: Secondary | ICD-10-CM | POA: Diagnosis not present

## 2018-07-11 DIAGNOSIS — I10 Essential (primary) hypertension: Secondary | ICD-10-CM

## 2018-07-11 DIAGNOSIS — Z780 Asymptomatic menopausal state: Secondary | ICD-10-CM

## 2018-07-11 LAB — CBC
HCT: 40.6 % (ref 36.0–46.0)
Hemoglobin: 13.6 g/dL (ref 12.0–15.0)
MCHC: 33.5 g/dL (ref 30.0–36.0)
MCV: 87.4 fl (ref 78.0–100.0)
Platelets: 133 10*3/uL — ABNORMAL LOW (ref 150.0–400.0)
RBC: 4.64 Mil/uL (ref 3.87–5.11)
RDW: 15.8 % — ABNORMAL HIGH (ref 11.5–15.5)
WBC: 4.5 10*3/uL (ref 4.0–10.5)

## 2018-07-11 LAB — LIPID PANEL
Cholesterol: 197 mg/dL (ref 0–200)
HDL: 39 mg/dL — ABNORMAL LOW (ref >39.00)
LDL Cholesterol: 126 mg/dL — ABNORMAL HIGH (ref 0–99)
NonHDL: 157.95
Total CHOL/HDL Ratio: 5
Triglycerides: 162 mg/dL — ABNORMAL HIGH (ref 0.0–149.0)
VLDL: 32.4 mg/dL (ref 0.0–40.0)

## 2018-07-11 LAB — COMPREHENSIVE METABOLIC PANEL
ALT: 16 U/L (ref 0–35)
AST: 18 U/L (ref 0–37)
Albumin: 4.2 g/dL (ref 3.5–5.2)
Alkaline Phosphatase: 72 U/L (ref 39–117)
BUN: 11 mg/dL (ref 6–23)
CO2: 29 mEq/L (ref 19–32)
Calcium: 9.1 mg/dL (ref 8.4–10.5)
Chloride: 101 mEq/L (ref 96–112)
Creatinine, Ser: 0.62 mg/dL (ref 0.40–1.20)
GFR: 96 mL/min (ref >60.00)
Glucose, Bld: 174 mg/dL — ABNORMAL HIGH (ref 70–99)
Potassium: 4.3 mEq/L (ref 3.5–5.1)
Sodium: 137 mEq/L (ref 135–145)
Total Bilirubin: 0.7 mg/dL (ref 0.2–1.2)
Total Protein: 6.9 g/dL (ref 6.0–8.3)

## 2018-07-11 LAB — HEMOGLOBIN A1C: Hgb A1c MFr Bld: 6.9 % — ABNORMAL HIGH (ref 4.6–6.5)

## 2018-07-11 NOTE — Progress Notes (Signed)
Subjective:    Patient ID: Erika Perry, female    DOB: [birth year], 68 y.o.   MRN: [id number]  HPI   Patient presents to clinic to establish with PCP.  Patient does have a history of elevated BP, but states she has never taken medication for it.  Per her chart it appears she was prescribed losartan in the past but per patient she never took this medication.  Patient also has a history of elevated blood sugars, but has never been on any sort of diabetes medicine.  Patient has not had mammogram in many many years, unsure of her last tetanus shot has never had colonoscopy.  She has not had lab work in many years.  Patient works at [employer], enjoys her job.    Patient answers no to both questions NPH every 2 screening for depression.  Denies feeling anxious or down.  Past medical, surgical, social family history reviewed and updated accordingly in chart.   Past Medical History:  Diagnosis Date  . Hypertension    Social History   Tobacco Use  . Smoking status: Never Smoker  . Smokeless tobacco: Never Used  Substance Use Topics  . Alcohol use: No   Past Surgical History:  Procedure Laterality Date  . CESAREAN SECTION  1981, 1990   Family History  Problem Relation Age of Onset  . Cancer Mother   . Diabetes Mother   . Hypertension Mother    Review of Systems  Constitutional: Negative for chills, fatigue and fever.  HENT: Negative for congestion, ear pain, sinus pain and sore throat.   Eyes: Negative.   Respiratory: Negative for cough, shortness of breath and wheezing.   Cardiovascular: Negative for chest pain, palpitations and leg swelling.  Gastrointestinal: Negative for abdominal pain, diarrhea, nausea and vomiting.  Genitourinary: Negative for dysuria, frequency and urgency.  Musculoskeletal: Negative for arthralgias and myalgias.  Skin: Negative for color change, pallor and rash.  Neurological: Negative for syncope, light-headedness and headaches.   Psychiatric/Behavioral: The patient is not nervous/anxious.       Objective:   Physical Exam Vitals signs and nursing note reviewed.  Constitutional:      General: She is not in acute distress.    Appearance: She is not ill-appearing, toxic-appearing or diaphoretic.  HENT:     Head: Normocephalic and atraumatic.     Right Ear: Tympanic membrane, ear canal and external ear normal.     Left Ear: Tympanic membrane, ear canal and external ear normal.  Eyes:     General: No scleral icterus.    Extraocular Movements: Extraocular movements intact.     Conjunctiva/sclera: Conjunctivae normal.     Pupils: Pupils are equal, round, and reactive to light.  Neck:     Musculoskeletal: Normal range of motion and neck supple. No neck rigidity.     Vascular: No carotid bruit.  Cardiovascular:     Rate and Rhythm: Normal rate and regular rhythm.     Heart sounds: Normal heart sounds.  Pulmonary:     Effort: Pulmonary effort is normal. No respiratory distress.     Breath sounds: Normal breath sounds.  Chest:     Comments: Declines breast exam in clinic today Genitourinary:    Comments: Declines pelvic exam in clinic today Musculoskeletal:     Right lower leg: No edema.     Left lower leg: No edema.  Lymphadenopathy:     Cervical: No cervical adenopathy.  Skin:    General: Skin is warm and  dry.     Coloration: Skin is not jaundiced or pale.  Neurological:     Mental Status: She is alert and oriented to person, place, and time.     Cranial Nerves: No cranial nerve deficit.     Motor: No weakness.     Gait: Gait normal.  Psychiatric:        Mood and Affect: Mood normal.        Behavior: Behavior normal.    Today's Vitals   07/11/18 1009  BP: (!) 162/88  Pulse: 83  Resp: 18  Temp: 98.2 F (36.8 C)  TempSrc: Oral  SpO2: 95%  Weight: 198 lb (89.8 kg)  Height: 5\' 2"  (1.575 m)   Body mass index is 36.21 kg/m.     Assessment & Plan:   Essential hypertension-patient has a  history of elevated BP, but has never taken medications for it.  BP is elevated today.  Patient would prefer to hold off on starting medicines until she returns for follow-up to recheck blood pressure and if it is elevated at that follow-up visit then at that point she said she would be agreeable to start medicine.  Impaired fasting glucose-patient has history of elevated blood sugars.  We will check labs and include A1c to rule out diabetes.  Screening mammogram ordered.  DEXA scan ordered.  We will also include cholesterol panel, hepatitis C screening, complete blood count in lab work.  Declines PNA vaccine. Aware she needs TDAP and that we cannot give in clinic due to medicare billing, but she can get at local pharmacy  Patient will follow-up here in 1 to 2 weeks for recheck on BP and to review labs in person

## 2018-07-12 ENCOUNTER — Encounter: Payer: Self-pay | Admitting: Family Medicine

## 2018-07-12 LAB — HEPATITIS C ANTIBODY
Hepatitis C Ab: NONREACTIVE
SIGNAL TO CUT-OFF: 0.01 (ref <1.00)

## 2018-07-18 ENCOUNTER — Other Ambulatory Visit: Payer: Self-pay

## 2018-07-18 ENCOUNTER — Ambulatory Visit (INDEPENDENT_AMBULATORY_CARE_PROVIDER_SITE_OTHER): Payer: Medicare Other | Admitting: Family Medicine

## 2018-07-18 ENCOUNTER — Encounter: Payer: Self-pay | Admitting: Family Medicine

## 2018-07-18 VITALS — BP 148/82 | HR 79 | Temp 98.2°F | Resp 18 | Ht 62.0 in | Wt 198.0 lb

## 2018-07-18 DIAGNOSIS — E119 Type 2 diabetes mellitus without complications: Secondary | ICD-10-CM | POA: Insufficient documentation

## 2018-07-18 DIAGNOSIS — E785 Hyperlipidemia, unspecified: Secondary | ICD-10-CM | POA: Insufficient documentation

## 2018-07-18 DIAGNOSIS — E1165 Type 2 diabetes mellitus with hyperglycemia: Secondary | ICD-10-CM | POA: Insufficient documentation

## 2018-07-18 DIAGNOSIS — I10 Essential (primary) hypertension: Secondary | ICD-10-CM

## 2018-07-18 MED ORDER — LOSARTAN POTASSIUM 25 MG PO TABS
25.0000 mg | ORAL_TABLET | Freq: Every day | ORAL | 1 refills | Status: DC
Start: 2018-07-18 — End: 2019-10-23

## 2018-07-18 MED ORDER — METFORMIN HCL 500 MG PO TABS: 500.0000 mg | ORAL_TABLET | Freq: Two times a day (BID) | ORAL | 1 refills | Status: DC

## 2018-07-18 MED ORDER — ATORVASTATIN CALCIUM 10 MG PO TABS
10.0000 mg | ORAL_TABLET | Freq: Every day | ORAL | 1 refills | Status: DC
Start: 2018-07-18 — End: 2019-10-23

## 2018-07-18 NOTE — Patient Instructions (Signed)
Diabetes Mellitus and Nutrition, Adult  When you have diabetes (diabetes mellitus), it is very important to have healthy eating habits because your blood sugar (glucose) levels are greatly affected by what you eat and drink. Eating healthy foods in the appropriate amounts, at about the same times every day, can help you:  · Control your blood glucose.  · Lower your risk of heart disease.  · Improve your blood pressure.  · Reach or maintain a healthy weight.  Every person with diabetes is different, and each person has different needs for a meal plan. Your health care provider may recommend that you work with a diet and nutrition specialist (dietitian) to make a meal plan that is best for you. Your meal plan may vary depending on factors such as:  · The calories you need.  · The medicines you take.  · Your weight.  · Your blood glucose, blood pressure, and cholesterol levels.  · Your activity level.  · Other health conditions you have, such as heart or kidney disease.  How do carbohydrates affect me?  Carbohydrates, also called carbs, affect your blood glucose level more than any other type of food. Eating carbs naturally raises the amount of glucose in your blood. Carb counting is a method for keeping track of how many carbs you eat. Counting carbs is important to keep your blood glucose at a healthy level, especially if you use insulin or take certain oral diabetes medicines.  It is important to know how many carbs you can safely have in each meal. This is different for every person. Your dietitian can help you calculate how many carbs you should have at each meal and for each snack.  Foods that contain carbs include:  · Bread, cereal, rice, pasta, and crackers.  · Potatoes and corn.  · Peas, beans, and lentils.  · Milk and yogurt.  · Fruit and juice.  · Desserts, such as cakes, cookies, ice cream, and candy.  How does alcohol affect me?  Alcohol can cause a sudden decrease in blood glucose (hypoglycemia),  especially if you use insulin or take certain oral diabetes medicines. Hypoglycemia can be a life-threatening condition. Symptoms of hypoglycemia (sleepiness, dizziness, and confusion) are similar to symptoms of having too much alcohol.  If your health care provider says that alcohol is safe for you, follow these guidelines:  · Limit alcohol intake to no more than 1 drink per day for nonpregnant women and 2 drinks per day for men. One drink equals 12 oz of beer, 5 oz of wine, or 1½ oz of hard liquor.  · Do not drink on an empty stomach.  · Keep yourself hydrated with water, diet soda, or unsweetened iced tea.  · Keep in mind that regular soda, juice, and other mixers may contain a lot of sugar and must be counted as carbs.  What are tips for following this plan?    Reading food labels  · Start by checking the serving size on the "Nutrition Facts" label of packaged foods and drinks. The amount of calories, carbs, fats, and other nutrients listed on the label is based on one serving of the item. Many items contain more than one serving per package.  · Check the total grams (g) of carbs in one serving. You can calculate the number of servings of carbs in one serving by dividing the total carbs by 15. For example, if a food has 30 g of total carbs, it would be equal to 2   servings of carbs.  · Check the number of grams (g) of saturated and trans fats in one serving. Choose foods that have low or no amount of these fats.  · Check the number of milligrams (mg) of salt (sodium) in one serving. Most people should limit total sodium intake to less than 2,300 mg per day.  · Always check the nutrition information of foods labeled as "low-fat" or "nonfat". These foods may be higher in added sugar or refined carbs and should be avoided.  · Talk to your dietitian to identify your daily goals for nutrients listed on the label.  Shopping  · Avoid buying canned, premade, or processed foods. These foods tend to be high in fat, sodium,  and added sugar.  · Shop around the outside edge of the grocery store. This includes fresh fruits and vegetables, bulk grains, fresh meats, and fresh dairy.  Cooking  · Use low-heat cooking methods, such as baking, instead of high-heat cooking methods like deep frying.  · Cook using healthy oils, such as olive, canola, or sunflower oil.  · Avoid cooking with butter, cream, or high-fat meats.  Meal planning  · Eat meals and snacks regularly, preferably at the same times every day. Avoid going long periods of time without eating.  · Eat foods high in fiber, such as fresh fruits, vegetables, beans, and whole grains. Talk to your dietitian about how many servings of carbs you can eat at each meal.  · Eat 4-6 ounces (oz) of lean protein each day, such as lean meat, chicken, fish, eggs, or tofu. One oz of lean protein is equal to:  ? 1 oz of meat, chicken, or fish.  ? 1 egg.  ? ¼ cup of tofu.  · Eat some foods each day that contain healthy fats, such as avocado, nuts, seeds, and fish.  Lifestyle  · Check your blood glucose regularly.  · Exercise regularly as told by your health care provider. This may include:  ? 150 minutes of moderate-intensity or vigorous-intensity exercise each week. This could be brisk walking, biking, or water aerobics.  ? Stretching and doing strength exercises, such as yoga or weightlifting, at least 2 times a week.  · Take medicines as told by your health care provider.  · Do not use any products that contain nicotine or tobacco, such as cigarettes and e-cigarettes. If you need help quitting, ask your health care provider.  · Work with a counselor or diabetes educator to identify strategies to manage stress and any emotional and social challenges.  Questions to ask a health care provider  · Do I need to meet with a diabetes educator?  · Do I need to meet with a dietitian?  · What number can I call if I have questions?  · When are the best times to check my blood glucose?  Where to find more  information:  · American Diabetes Association: diabetes.org  · Academy of Nutrition and Dietetics: www.eatright.org  · National Institute of Diabetes and Digestive and Kidney Diseases (NIH): www.niddk.nih.gov  Summary  · A healthy meal plan will help you control your blood glucose and maintain a healthy lifestyle.  · Working with a diet and nutrition specialist (dietitian) can help you make a meal plan that is best for you.  · Keep in mind that carbohydrates (carbs) and alcohol have immediate effects on your blood glucose levels. It is important to count carbs and to use alcohol carefully.  This information is not intended to   replace advice given to you by your health care provider. Make sure you discuss any questions you have with your health care provider.  Document Released: 10/23/2004 Document Revised: 08/26/2016 Document Reviewed: 03/02/2016  Elsevier Interactive Patient Education © 2019 Elsevier Inc.

## 2018-07-18 NOTE — Progress Notes (Signed)
Subjective:    Patient ID: Erika Perry, female    DOB: 10/01/1950, 68 y.o.   MRN: [id number]  HPI   Patient presents to clinic for follow-up on blood pressure.  She had previously been told she had high BP in the past, but had never taken anything for it.  She wanted to wait until lab results that were back and we recheck BP before considering starting any new medication.  Lab work done at last visit did reveal patient is a diabetic, A1c 6.9%.  Also showed that she has elevated cholesterol.  Patient states she has never been told she was prediabetic in the past and does not recall ever being told her cholesterol was high.   Patient Active Problem List   Diagnosis Date Noted  . Type 2 diabetes mellitus without complication, without long-term current use of insulin (HCC) 07/18/2018  . Hyperlipidemia 07/18/2018  . Chronic venous insufficiency 03/24/2017  . Lymphedema 03/24/2017  . Essential hypertension 02/23/2017  . 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 Bilateral lower extremity edema 02/23/2017   Social History   Tobacco Use  . Smoking status: Never Smoker  . Smokeless tobacco: Never Used  Substance Use Topics  . Alcohol use: No   Review of Systems  Constitutional: Negative for chills, fatigue and fever.  HENT: Negative for congestion, ear pain, sinus pain and sore throat.   Eyes: Negative.   Respiratory: Negative for cough, shortness of breath and wheezing.   Cardiovascular: Negative for chest pain, palpitations and leg swelling.  Gastrointestinal: Negative for abdominal pain, diarrhea, nausea and vomiting.  Genitourinary: Negative for dysuria, frequency and urgency.  Musculoskeletal: Negative for arthralgias and myalgias.  Skin: Negative for color change, pallor and rash.  Neurological: Negative for syncope, light-headedness and headaches.  Psychiatric/Behavioral: The patient is not nervous/anxious.        Objective:   Physical Exam Vitals signs and nursing note reviewed.  Constitutional:      General: She is not in acute distress.    Appearance: She is obese. She is not ill-appearing, toxic-appearing or diaphoretic.  HENT:     Head: Normocephalic and atraumatic.  Eyes:     General: No scleral icterus.    Extraocular Movements: Extraocular movements intact.     Pupils: Pupils are equal, round, and reactive to light.  Cardiovascular:     Rate and Rhythm: Normal rate and regular rhythm.     Heart sounds: Normal heart sounds.  Pulmonary:     Effort: Pulmonary effort is normal. No respiratory distress.     Breath sounds: Normal breath sounds.  Musculoskeletal:     Right lower leg: No edema.     Left lower leg: No edema.  Skin:    General: Skin is warm and dry.     Coloration: Skin is not jaundiced or pale.  Neurological:     Mental Status: She is alert and oriented to person, place, and time. Mental status is at baseline.  Psychiatric:        Mood and Affect: Mood normal.        Behavior: Behavior normal.        Thought Content: Thought content normal.        Judgment: Judgment normal.    Vitals:   07/18/18 1028  BP: (!) 148/82  Pulse: 79  Resp: 18  Temp: 98.2 F (36.8 C)  SpO2: 92%  Assessment & Plan:    Type 2 diabetes-patient is a newly diagnosed diabetic.  Offered referral to diabetes education, but she declines.  Patient given handout outlining nutrition and diabetes and she will also begin metformin 500 mg twice daily.  Patient given glucometer and advised to check blood sugar at least 1-2 times per day.  Advised that her goal is a fasting reading of under 130 and a nonfasting reading of under 180.  Advised that if her readings are not in that range to begin with that is okay, these are the goals we are striving for.  Hyperlipidemia- due to patient being diabetic and also having high BP, I recommend she begin statin.  She will begin atorvastatin 10 mg  daily  Essential hypertension-patient's BP is better than previous visit, but still on higher end.  Due to her being diabetic and having high cholesterol do recommend she begin a BP medication.  She will begin losartan 25 mg daily.  Patient will follow-up here in approximately 6 weeks for recheck on diabetes, and to follow-up on how she is doing on the new medications.  She is aware she can call clinic anytime with questions or concerns.

## 2018-07-28 ENCOUNTER — Encounter: Payer: Self-pay | Admitting: Family Medicine

## 2018-08-21 IMAGING — CR DG KNEE COMPLETE 4+V*L*
4 series · 4 of 4 positions shown · non-contrast
Comparison: None.

CLINICAL DATA: Patient reports pain on lateral surface of left knee
post fall on ice today. No previous injuries or surgeries to knee.

EXAM:
LEFT KNEE - COMPLETE 4+ VIEW

[knee ap]
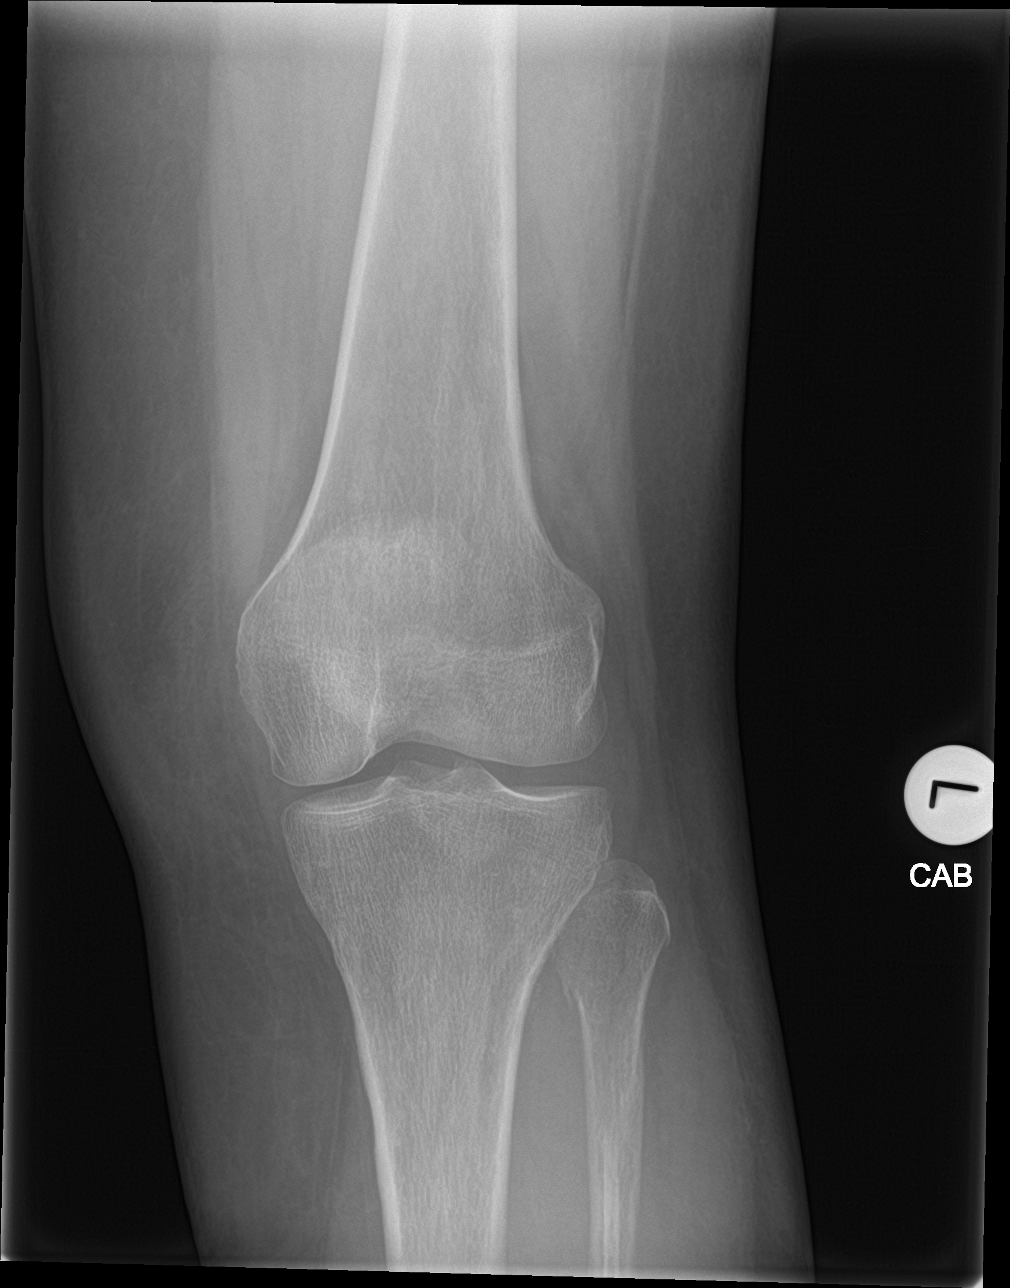

[knee obl (1 of 2)]
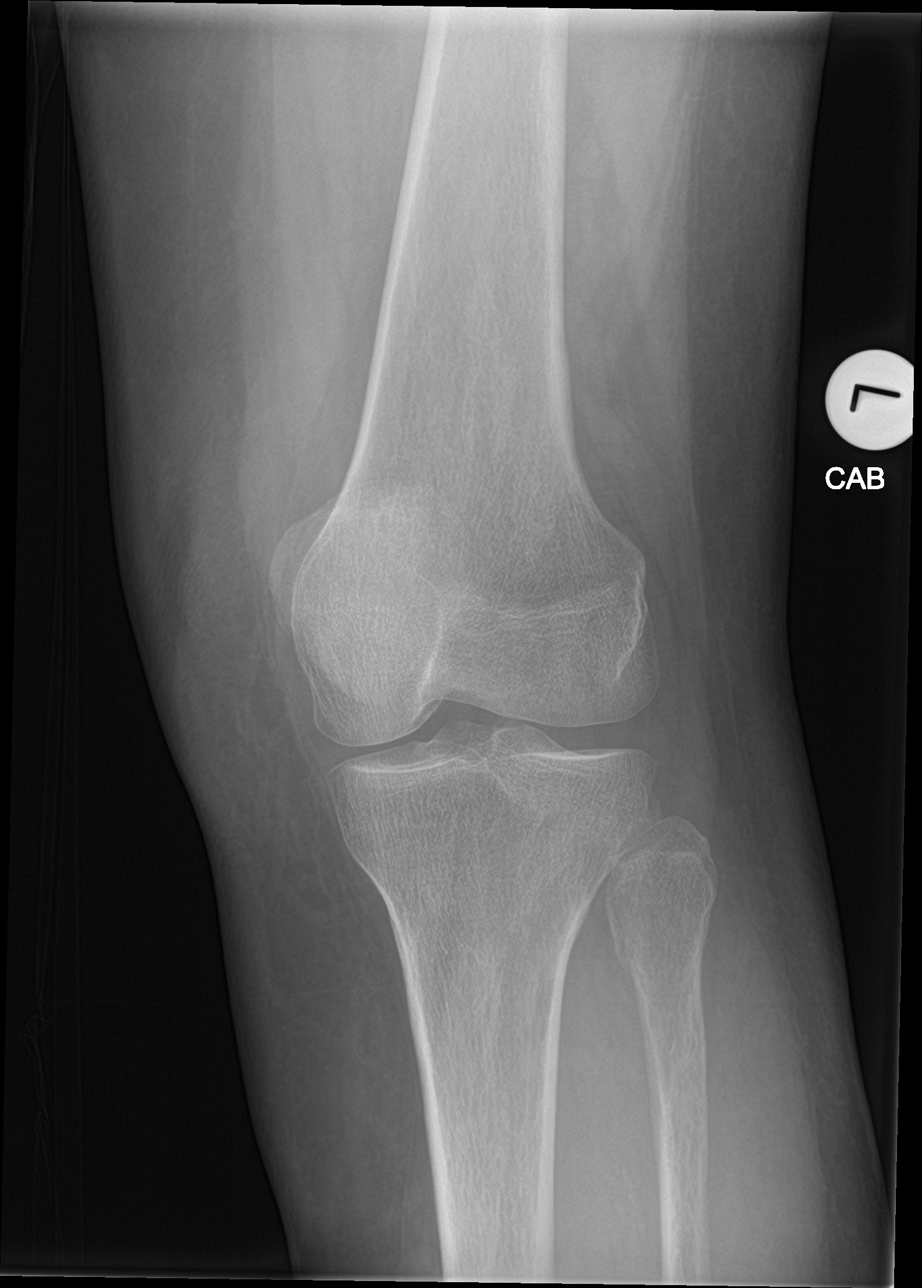

[knee obl (2 of 2)]
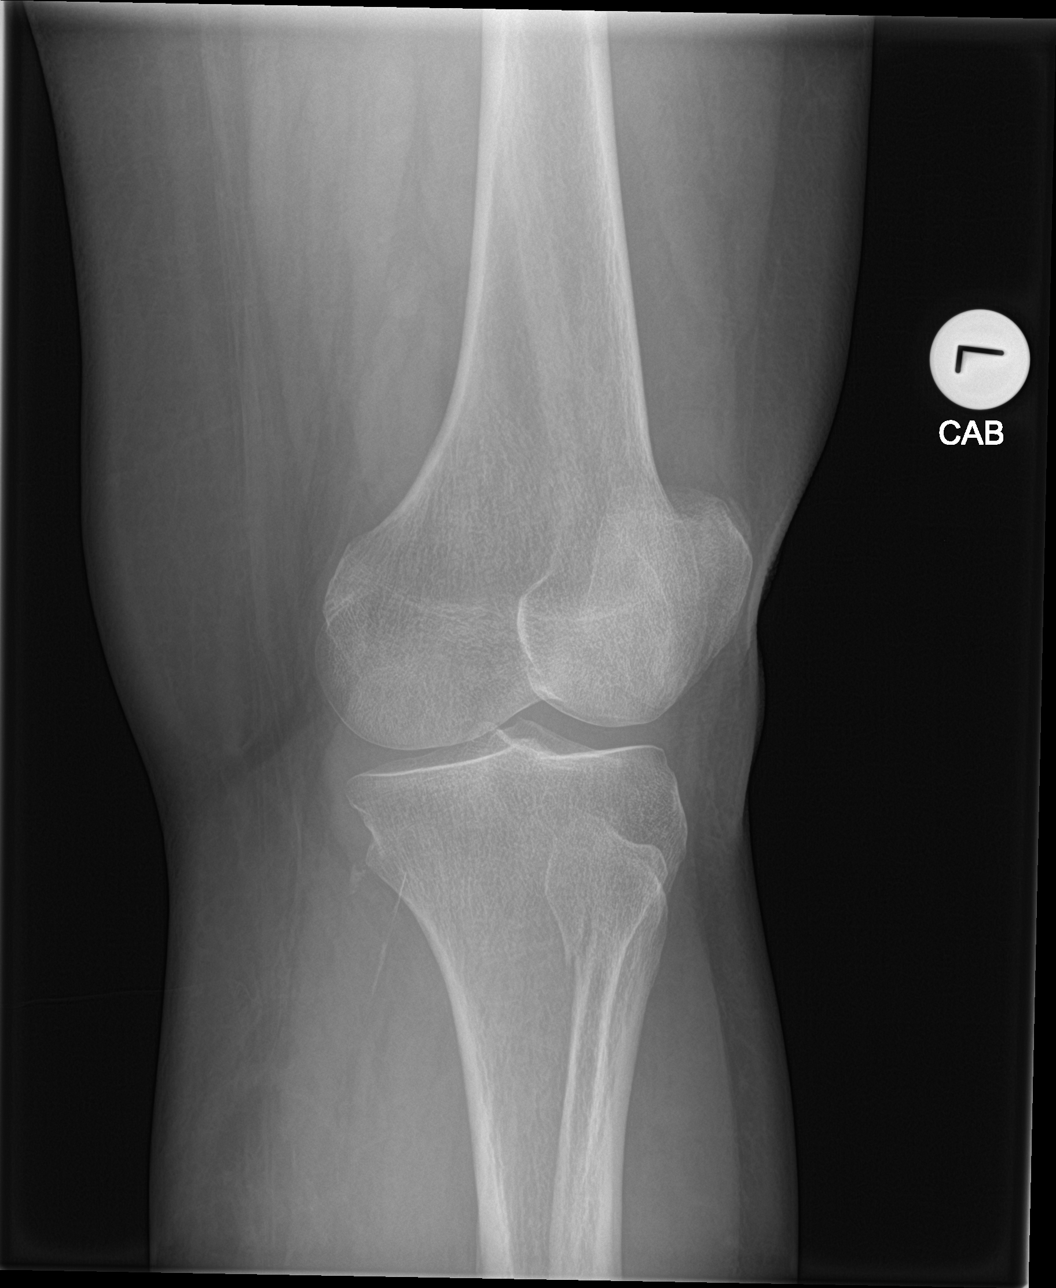

[knee lat]
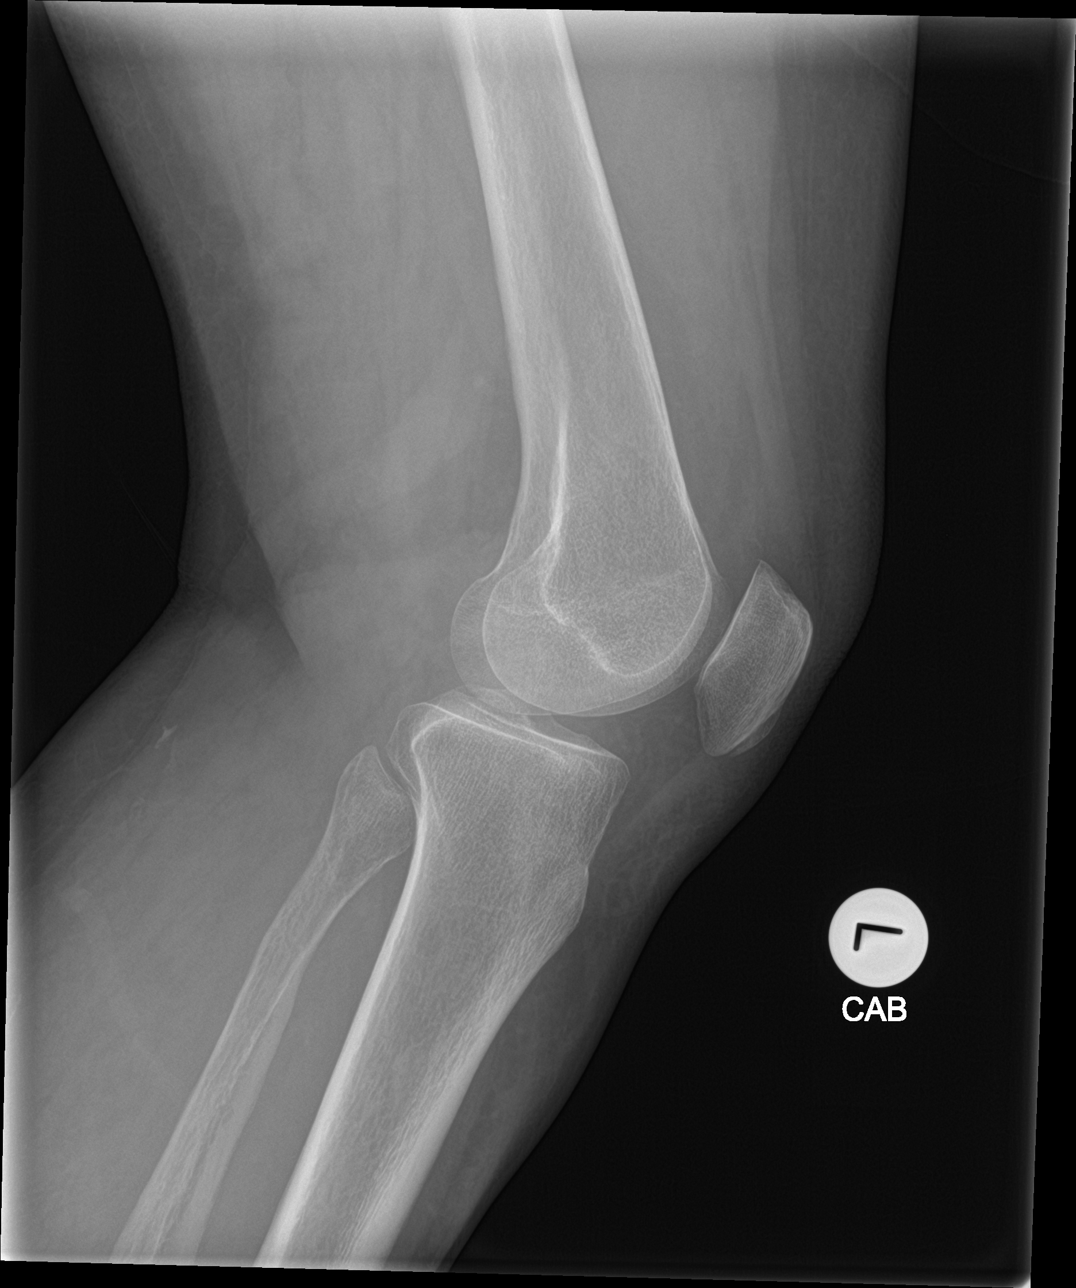

[4 of 4 positions shown; findings below may reference images not displayed]

FINDINGS: There is a minimally displaced fracture the proximal fibular head.
No fracture of the tibia or femur. No joint effusion.
IMPRESSION: Minimally displaced fracture of the proximal fibula.

## 2018-08-25 ENCOUNTER — Telehealth: Payer: Self-pay

## 2018-08-25 NOTE — Telephone Encounter (Signed)
Copied from [place] [phone]. Topic: General - Other >> Aug 25, 2018 12:58 PM Leward Quan A wrote: Reason for CRM: Patient called to cancel her appointment that is scheduled for 08/30/2018 she will be going out of town and will call back to reschedule at a later date. Please cancel Ph# [phone]

## 2018-08-25 NOTE — Telephone Encounter (Signed)
Appt cancelled

## 2018-08-30 ENCOUNTER — Ambulatory Visit: Payer: Medicare Other | Admitting: Family Medicine

## 2019-10-23 ENCOUNTER — Encounter: Payer: Self-pay | Admitting: Emergency Medicine

## 2019-10-23 ENCOUNTER — Ambulatory Visit
Admission: EM | Admit: 2019-10-23 | Discharge: 2019-10-23 | Disposition: A | Discharge status: Home / Self Care | Payer: Medicare Other | Attending: Physician Assistant | Admitting: Physician Assistant

## 2019-10-23 ENCOUNTER — Other Ambulatory Visit: Payer: Self-pay

## 2019-10-23 DIAGNOSIS — R05 Cough: Secondary | ICD-10-CM

## 2019-10-23 DIAGNOSIS — E785 Hyperlipidemia, unspecified: Secondary | ICD-10-CM | POA: Insufficient documentation

## 2019-10-23 DIAGNOSIS — Z79899 Other long term (current) drug therapy: Secondary | ICD-10-CM | POA: Diagnosis not present

## 2019-10-23 DIAGNOSIS — Z881 Allergy status to other antibiotic agents status: Secondary | ICD-10-CM | POA: Diagnosis not present

## 2019-10-23 DIAGNOSIS — Z833 Family history of diabetes mellitus: Secondary | ICD-10-CM | POA: Diagnosis not present

## 2019-10-23 DIAGNOSIS — E119 Type 2 diabetes mellitus without complications: Secondary | ICD-10-CM | POA: Diagnosis not present

## 2019-10-23 DIAGNOSIS — U071 COVID-19: Secondary | ICD-10-CM | POA: Insufficient documentation

## 2019-10-23 DIAGNOSIS — I872 Venous insufficiency (chronic) (peripheral): Secondary | ICD-10-CM | POA: Diagnosis not present

## 2019-10-23 DIAGNOSIS — Z8249 Family history of ischemic heart disease and other diseases of the circulatory system: Secondary | ICD-10-CM | POA: Diagnosis not present

## 2019-10-23 DIAGNOSIS — Z7984 Long term (current) use of oral hypoglycemic drugs: Secondary | ICD-10-CM | POA: Diagnosis not present

## 2019-10-23 DIAGNOSIS — R059 Cough, unspecified: Secondary | ICD-10-CM

## 2019-10-23 DIAGNOSIS — I10 Essential (primary) hypertension: Secondary | ICD-10-CM | POA: Insufficient documentation

## 2019-10-23 MED ORDER — BENZONATATE 200 MG PO CAPS: 200.0000 mg | ORAL_CAPSULE | Freq: Two times a day (BID) | ORAL | 0 refills | Status: DC | PRN

## 2019-10-23 NOTE — ED Provider Notes (Signed)
[facility]    CSN: [id number] Arrival date & time: 10/23/19  1100      History   Chief Complaint Chief Complaint  Patient presents with  . Cough    HPI Erika Perry is a 69 y.o. female who presents with onset of cough x 3 days. Her cough is non productive and have been keeping her up at night. Has tried robitussin but has not helped. She denies fever, chills, body aches, change is taste or smell. Has been mildly fatigued. Denies ST, rhinitis.     Past Medical History:  Diagnosis Date  . Hypertension     Patient Active Problem List   Diagnosis Date Noted  . Type 2 diabetes mellitus without complication, without long-term current use of insulin (HCC) 07/18/2018  . Hyperlipidemia 07/18/2018  . Chronic venous insufficiency 03/24/2017  . Lymphedema 03/24/2017  . Essential hypertension 02/23/2017  . 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 Bilateral lower extremity edema 02/23/2017    Past Surgical History:  Procedure Laterality Date  . CESAREAN SECTION  1981, 1990    OB History   No obstetric history on file.      Home Medications    Prior to Admission medications   Medication Sig Start Date End Date Taking? Authorizing Provider  benzonatate (TESSALON) 200 MG capsule Take 1 capsule (200 mg total) by mouth 2 (two) times daily as needed for cough. 10/23/19   Rodriguez-Southworth, Nettie Elm, PA-C  atorvastatin (LIPITOR) 10 MG tablet Take 1 tablet (10 mg total) by mouth daily. 07/18/18 10/23/19  Tracey Harries, FNP  losartan (COZAAR) 25 MG tablet Take 1 tablet (25 mg total) by mouth daily. 07/18/18 10/23/19  Tracey Harries, FNP  metFORMIN (GLUCOPHAGE) 500 MG tablet Take 1 tablet (500 mg total) by mouth 2 (two) times daily with a meal. 07/18/18 10/23/19  Guse, Janna Arch, FNP    Family History Family History  Problem Relation Age of Onset  . Cancer Mother   . Diabetes Mother   . Hypertension  Mother   . Other Father        unknown medical history    Social History Social History   Tobacco Use  . Smoking status: Never Smoker  . Smokeless tobacco: Never Used  Vaping Use  . Vaping Use: Never used  Substance Use Topics  . Alcohol use: No  . Drug use: Never     Allergies   Bactrim [sulfamethoxazole-trimethoprim]   Review of Systems Review of Systems + for cough, and mild fatigue. The rest of 10 point ROS is neg.   Physical Exam Triage Vital Signs ED Triage Vitals  Enc Vitals Group     BP 10/23/19 1258 (!) 148/74     Pulse Rate 10/23/19 1258 87     Resp 10/23/19 1258 18     Temp 10/23/19 1258 98.5 F (36.9 C)     Temp Source 10/23/19 1258 Oral     SpO2 10/23/19 1258 98 %     Weight 10/23/19 1257 193 lb (87.5 kg)     Height 10/23/19 1257 5\' 2"  (1.575 m)     Head Circumference --      Peak Flow --      Pain Score 10/23/19 1257 0     Pain Loc --      Pain Edu? --      Excl. in GC? --    No data found.  Updated Vital Signs BP (!) 148/74 (BP Location: Left Arm)   Pulse 87   Temp 98.5 F (36.9 C) (Oral)   Resp 18   Ht 5\' 2"  (1.575 m)   Wt 193 lb (87.5 kg)   SpO2 98%   BMI 35.30 kg/m   Visual Acuity Right Eye Distance:   Left Eye Distance:   Bilateral Distance:    Right Eye Near:   Left Eye Near:    Bilateral Near:     Physical Exam Physical Exam Vitals signs and nursing note reviewed.  Constitutional:      General: She is not in acute distress.    Appearance: Normal appearance. She is not ill-appearing, toxic-appearing or diaphoretic.  HENT:     Head: Normocephalic.     Right Ear: Tympanic membrane, ear canal and external ear normal.     Left Ear: Tympanic membrane, ear canal and external ear normal.     Nose: Nose normal.     Mouth/Throat:     Mouth: Mucous membranes are moist.  Eyes:     General: No scleral icterus.       Right eye: No discharge.        Left eye: No discharge.     Conjunctiva/sclera: Conjunctivae normal.  Neck:      Musculoskeletal: Neck supple. No neck rigidity.  Cardiovascular:     Rate and Rhythm: Normal rate and regular rhythm.     Heart sounds: No murmur.  Pulmonary:     Effort: Pulmonary effort is normal.     Breath sounds: Normal breath sounds.  Abdominal:     General: Bowel sounds are normal. There is no distension.     Palpations: Abdomen is soft. There is no mass.     Tenderness: There is no abdominal tenderness. There is no guarding or rebound.     Hernia: No hernia is present.  Musculoskeletal: Normal range of motion.  Lymphadenopathy:     Cervical: No cervical adenopathy.  Skin:    General: Skin is warm and dry.     Coloration: Skin is not jaundiced.     Findings: No rash.  Neurological:     Mental Status: She is alert and oriented to person, place, and time.     Gait: Gait normal.  Psychiatric:        Mood and Affect: Mood normal.        Behavior: Behavior normal.        Thought Content: Thought content normal.        Judgment: Judgment normal.     UC Treatments / Results  Labs (all labs ordered are listed, but only abnormal results are displayed) Labs Reviewed  SARS CORONAVIRUS 2 (TAT 6-24 HRS)    EKG   Radiology No results found.  Procedures Procedures (including critical care time)  Medications Ordered in UC Medications - No data to display  Initial Impression / Assessment and Plan / UC Course  I have reviewed the triage vital signs and the nursing notes. Covid test is pending. I placed her on Tessalon Perles as noted. See instructions.  Final Clinical Impressions(s) / UC Diagnoses   Final diagnoses:  Cough     Discharge Instructions     If your Covid test ends up positive you may take the following supplements to help your immune system be stronger to fight this viral infection Take Quarcetin 500 mg three times a day x 7 days with Zinc 50 mg ones a day x 7  days. The quarcetin is an antiviral and anti-inflammatory supplement which helps open the  zinc channels in the cell to absorb Zinc. Zinc helps decrease the virus load in your body. Take Melatonin 6-10 mg at bed time which also helps support your immune system.  Also make sure to take Vit D 5,000 IU per day with a fatty meal and Vit C 1000 mg a day until you are completely better. Stay on Vitamin D 2,000  and C the rest of the season.  Don't lay around, keep active and walk as much as you are able to to prevent worsening of your symptoms.  Follow up with your family Dr next week.  If you get short of breath and you are able to check  your oxygen with a pulse oxygen meter, if it gets to 92% or less, you need to go to the hospital to be admitted. If you dont have one, come back here and we will assess you.     ED Prescriptions    Medication Sig Dispense Auth. Provider   benzonatate (TESSALON) 200 MG capsule Take 1 capsule (200 mg total) by mouth 2 (two) times daily as needed for cough. 30 capsule Rodriguez-Southworth, Nettie Elm, PA-C     PDMP not reviewed this encounter.   Garey Ham, [place] 10/23/19 1338

## 2019-10-23 NOTE — ED Triage Notes (Signed)
Patient in today c/o cough x 3 days. Patient denies fever or any other symptoms. Patient has been taking OTC Robitussin. Patient has not had covid vaccines.

## 2019-10-23 NOTE — Discharge Instructions (Addendum)
If your Covid test ends up positive you may take the following supplements to help your immune system be stronger to fight this viral infection Take Quarcetin 500 mg three times a day x 7 days with Zinc 50 mg ones a day x 7 days. The quarcetin is an antiviral and anti-inflammatory supplement which helps open the zinc channels in the cell to absorb Zinc. Zinc helps decrease the virus load in your body. Take Melatonin 6-10 mg at bed time which also helps support your immune system.  Also make sure to take Vit D 5,000 IU per day with a fatty meal and Vit C 1000 mg a day until you are completely better. Stay on Vitamin D 2,000  and C the rest of the season.  Don't lay around, keep active and walk as much as you are able to to prevent worsening of your symptoms.  Follow up with your family Dr next week.  If you get short of breath and you are able to check  your oxygen with a pulse oxygen meter, if it gets to 92% or less, you need to go to the hospital to be admitted. If you dont have one, come back here and we will assess you.   

## 2019-10-24 LAB — SARS CORONAVIRUS 2 (TAT 6-24 HRS): SARS Coronavirus 2: POSITIVE — AB

## 2019-10-25 ENCOUNTER — Telehealth: Payer: Self-pay | Admitting: Oncology

## 2019-10-25 ENCOUNTER — Encounter: Payer: Self-pay | Admitting: Oncology

## 2019-10-25 NOTE — Telephone Encounter (Signed)
Called to Discuss with patient about Covid symptoms and the use of regeneron, a monoclonal antibody infusion for those with mild to moderate Covid symptoms and at a high risk of hospitalization.     Pt is qualified for this infusion at the [facility] infusion center due to co-morbid conditions and/or a member of an at-risk group.    Past Medical History:  Diagnosis Date  . Hypertension    Unable to reach pt. Left VM and mychart message.   Consuello Masse, DNP, AGNP-C [phone] (Infusion Center Hotline)

## 2019-10-26 ENCOUNTER — Other Ambulatory Visit (HOSPITAL_COMMUNITY): Payer: Self-pay | Admitting: Nurse Practitioner

## 2019-10-26 DIAGNOSIS — E119 Type 2 diabetes mellitus without complications: Secondary | ICD-10-CM

## 2019-10-26 DIAGNOSIS — U071 COVID-19: Secondary | ICD-10-CM

## 2019-10-26 DIAGNOSIS — I10 Essential (primary) hypertension: Secondary | ICD-10-CM

## 2019-10-26 NOTE — Progress Notes (Signed)
I connected by phone with Erika Perry on 10/26/2019 at 11:48 AM to discuss the potential use of a new treatment for mild to moderate COVID-19 viral infection in non-hospitalized patients.  This patient is a 69 y.o. female that meets the FDA criteria for Emergency Use Authorization of COVID monoclonal antibody casirivimab/imdevimab.  Has a (+) direct SARS-CoV-2 viral test result  Has mild or moderate COVID-19   Is NOT hospitalized due to COVID-19  Is within 10 days of symptom onset 10/22/19  Has at least one of the high risk factor(s) for progression to severe COVID-19 and/or hospitalization as defined in EUA.  Specific high risk criteria : Older age (>/= 68 yo), BMI > 25 and Cardiovascular disease or hypertension   I have spoken and communicated the following to the patient or parent/caregiver regarding COVID monoclonal antibody treatment:  1. FDA has authorized the emergency use for the treatment of mild to moderate COVID-19 in adults and pediatric patients with positive results of direct SARS-CoV-2 viral testing who are 95 years of age and older weighing at least 40 kg, and who are at high risk for progressing to severe COVID-19 and/or hospitalization.  2. The significant known and potential risks and benefits of COVID monoclonal antibody, and the extent to which such potential risks and benefits are unknown.  3. Information on available alternative treatments and the risks and benefits of those alternatives, including clinical trials.  4. Patients treated with COVID monoclonal antibody should continue to self-isolate and use infection control measures (e.g., wear mask, isolate, social distance, avoid sharing personal items, clean and disinfect high touch surfaces, and frequent handwashing) according to CDC guidelines.   5. The patient or parent/caregiver has the option to accept or refuse COVID monoclonal antibody treatment.  After reviewing this information with the patient, The  patient agreed to proceed with receiving casirivimab\imdevimab infusion and will be provided a copy of the Fact sheet prior to receiving the infusion. [name] 10/26/2019 11:48 AM

## 2019-10-27 ENCOUNTER — Ambulatory Visit (HOSPITAL_COMMUNITY)
Admission: RE | Admit: 2019-10-27 | Discharge: 2019-10-27 | Disposition: A | Discharge status: Home / Self Care | Payer: Medicare Other | Source: Ambulatory Visit | Attending: Pulmonary Disease | Admitting: Pulmonary Disease

## 2019-10-27 DIAGNOSIS — I10 Essential (primary) hypertension: Secondary | ICD-10-CM | POA: Diagnosis not present

## 2019-10-27 DIAGNOSIS — U071 COVID-19: Secondary | ICD-10-CM | POA: Diagnosis not present

## 2019-10-27 DIAGNOSIS — E119 Type 2 diabetes mellitus without complications: Secondary | ICD-10-CM

## 2019-10-27 DIAGNOSIS — Z23 Encounter for immunization: Secondary | ICD-10-CM | POA: Diagnosis not present

## 2019-10-27 MED ORDER — SODIUM CHLORIDE 0.9 % IV SOLN
1200.0000 mg | Freq: Once | INTRAVENOUS | Status: AC
  Administered 2019-10-27: 1200 mg via INTRAVENOUS

## 2019-10-27 MED ORDER — SODIUM CHLORIDE 0.9 % IV SOLN: INTRAVENOUS | Status: DC | PRN

## 2019-10-27 MED ORDER — ALBUTEROL SULFATE HFA 108 (90 BASE) MCG/ACT IN AERS: 2.0000 | INHALATION_SPRAY | Freq: Once | RESPIRATORY_TRACT | Status: DC | PRN

## 2019-10-27 MED ORDER — EPINEPHRINE 0.3 MG/0.3ML IJ SOAJ: 0.3000 mg | Freq: Once | INTRAMUSCULAR | Status: DC | PRN

## 2019-10-27 MED ORDER — METHYLPREDNISOLONE SODIUM SUCC 125 MG IJ SOLR: 125.0000 mg | Freq: Once | INTRAMUSCULAR | Status: DC | PRN

## 2019-10-27 MED ORDER — DIPHENHYDRAMINE HCL 50 MG/ML IJ SOLN: 50.0000 mg | Freq: Once | INTRAMUSCULAR | Status: DC | PRN

## 2019-10-27 MED ORDER — FAMOTIDINE IN NACL 20-0.9 MG/50ML-% IV SOLN: 20.0000 mg | Freq: Once | INTRAVENOUS | Status: DC | PRN

## 2019-10-27 NOTE — Discharge Instructions (Signed)

## 2019-10-27 NOTE — Progress Notes (Signed)
  Diagnosis: COVID-19  Physician: Dr Wright  Procedure: Covid Infusion Clinic Med: casirivimab\imdevimab infusion - Provided patient with casirivimab\imdevimab fact sheet for patients, parents and caregivers prior to infusion.  Complications: No immediate complications noted.  Discharge: Discharged home   Erika Perry 10/27/2019   

## 2020-02-05 DIAGNOSIS — L089 Local infection of the skin and subcutaneous tissue, unspecified: Secondary | ICD-10-CM | POA: Diagnosis not present

## 2020-02-10 DIAGNOSIS — L03011 Cellulitis of right finger: Secondary | ICD-10-CM | POA: Diagnosis not present

## 2020-02-20 DIAGNOSIS — L309 Dermatitis, unspecified: Secondary | ICD-10-CM | POA: Diagnosis not present

## 2020-04-16 ENCOUNTER — Encounter: Payer: Self-pay | Admitting: Adult Health

## 2020-04-16 ENCOUNTER — Other Ambulatory Visit: Payer: Self-pay

## 2020-04-16 ENCOUNTER — Ambulatory Visit (INDEPENDENT_AMBULATORY_CARE_PROVIDER_SITE_OTHER): Payer: Medicare Other | Admitting: Adult Health

## 2020-04-16 VITALS — BP 163/76 | HR 74 | Temp 97.9°F | Resp 16 | Ht 61.0 in | Wt 199.6 lb

## 2020-04-16 DIAGNOSIS — E119 Type 2 diabetes mellitus without complications: Secondary | ICD-10-CM | POA: Diagnosis not present

## 2020-04-16 DIAGNOSIS — E785 Hyperlipidemia, unspecified: Secondary | ICD-10-CM | POA: Diagnosis not present

## 2020-04-16 DIAGNOSIS — R82998 Other abnormal findings in urine: Secondary | ICD-10-CM | POA: Diagnosis not present

## 2020-04-16 DIAGNOSIS — I83023 Varicose veins of left lower extremity with ulcer of ankle: Secondary | ICD-10-CM | POA: Diagnosis not present

## 2020-04-16 DIAGNOSIS — Z1389 Encounter for screening for other disorder: Secondary | ICD-10-CM | POA: Diagnosis not present

## 2020-04-16 DIAGNOSIS — I10 Essential (primary) hypertension: Secondary | ICD-10-CM

## 2020-04-16 DIAGNOSIS — Z8639 Personal history of other endocrine, nutritional and metabolic disease: Secondary | ICD-10-CM

## 2020-04-16 DIAGNOSIS — L97321 Non-pressure chronic ulcer of left ankle limited to breakdown of skin: Secondary | ICD-10-CM | POA: Diagnosis not present

## 2020-04-16 DIAGNOSIS — Z6837 Body mass index (BMI) 37.0-37.9, adult: Secondary | ICD-10-CM

## 2020-04-16 LAB — POCT URINALYSIS DIPSTICK
Blood, UA: NEGATIVE
Glucose, UA: NEGATIVE
Ketones, UA: NEGATIVE
Nitrite, UA: NEGATIVE
Protein, UA: NEGATIVE
Spec Grav, UA: >=1.03 — AB (ref 1.010–1.025)
Urobilinogen, UA: 0.2 E.U./dL
pH, UA: 5 (ref 5.0–8.0)

## 2020-04-16 NOTE — Progress Notes (Signed)
New patient visit   Patient: Erika Perry   DOB: May 02, 1950   70 y.o. Female  MRN: [id number] Visit Date: 04/16/2020  Today's healthcare provider: Marcille Buffy, FNP   Chief Complaint  Patient presents with   New Patient (Initial Visit)   Subjective    Erika Perry is a 70 y.o. female who presents today as a new patient to establish care.  HPI  Patient comes in office today to establish care she states that she feels well today and has no complaints to address. Patient reports that she follows a general diet, she is actively exercising weekly and reports that sleep patterns are well.   Blood pressure is high in office today and she reports she is always high. She denies any readings that are high at home.  She had been prescribed Losartan 31m in past but did not take due to cost.    She was treated for skin infection on 02/15/20 with Bactroban, and Cleocin and it was right pointer finger and resolved denies any pain in finger or paim.  She is due for colonoscopy and she declines at the time.  She does not want an mammogram.   Patient  denies any fever, body aches,chills, rash, chest pain, shortness of breath, nausea, vomiting, or diarrhea.    Allergies  Allergen Reactions   Bactrim [Sulfamethoxazole-Trimethoprim]     Nausea, no appitite   Penicillins     Other reaction(s): Nausea/Vomiting ()   Past Medical History:  Diagnosis Date   Hypertension    Past Surgical History:  Procedure Laterality Date   CESAREAN SECTION  1981, 136  Family Status  Relation Name Status   Mother  Deceased   Father  Deceased   Family History  Problem Relation Age of Onset   Cancer Mother    Diabetes Mother    Hypertension Mother    Other Father        unknown medical history   Social History   Socioeconomic History   Marital status: Widowed    Spouse name: Not on file   Number of children: Not on file   Years of education: Not on file    Highest education level: Not on file  Occupational History   Not on file  Tobacco Use   Smoking status: Never Smoker   Smokeless tobacco: Never Used  Vaping Use   Vaping Use: Never used  Substance and Sexual Activity   Alcohol use: No   Drug use: Never   Sexual activity: Not Currently  Other Topics Concern   Not on file  Social History Narrative   Not on file   Social Determinants of Health   Financial Resource Strain: Not on file  Food Insecurity: Not on file  Transportation Needs: Not on file  Physical Activity: Not on file  Stress: Not on file  Social Connections: Not on file   Outpatient Medications Prior to Visit  Medication Sig   [DISCONTINUED] benzonatate (TESSALON) 200 MG capsule Take 1 capsule (200 mg total) by mouth 2 (two) times daily as needed for cough.   No facility-administered medications prior to visit.   Allergies  Allergen Reactions   Bactrim [Sulfamethoxazole-Trimethoprim]     Nausea, no appitite   Penicillins     Other reaction(s): Nausea/Vomiting ()     There is no immunization history on file for this patient.  Health Maintenance  Topic Date Due   OPHTHALMOLOGY EXAM  Never done   URINE  MICROALBUMIN  Never done   TETANUS/TDAP  Never done   COVID-19 Vaccine (1) 05/02/2020 (Originally 06/28/1955)   INFLUENZA VACCINE  05/09/2020 (Originally 09/10/2019)   MAMMOGRAM  04/16/2021 (Originally 06/28/2001)   DEXA SCAN  04/16/2021 (Originally 06/28/2016)   COLONOSCOPY (Pts 45-53yr Insurance coverage will need to be confirmed)  04/16/2021 (Originally 06/28/1996)   PNA vac Low Risk Adult (1 of 2 - PCV13) 04/16/2021 (Originally 06/28/2016)   HEMOGLOBIN A1C  10/17/2020   FOOT EXAM  04/16/2021   Hepatitis C Screening  Completed   HPV VACCINES  Aged Out    Patient Care Team: GJodelle Green FNP as PCP - General (Family Medicine)  Review of Systems  Constitutional: Negative.   HENT: Negative.   Respiratory: Negative.    Cardiovascular: Negative.   Gastrointestinal: Negative.   Genitourinary: Negative.   Musculoskeletal: Negative.   Skin: Negative.   Psychiatric/Behavioral: Negative.   All other systems reviewed and are negative.     Objective    BP (!) 163/76    Pulse 74    Temp 97.9 F (36.6 C) (Oral)    Resp 16    Ht _0  (1.549 m)    Wt 199 lb 9.6 oz (90.5 kg)    SpO2 94%    BMI 37.71 kg/m   Vitals:   04/16/20 0916 04/16/20 0943  BP: (!) 167/76 (!) 163/76  Pulse: 75 74  Resp: 16   Temp: 97.9 F (36.6 C)   TempSrc: Oral   SpO2: 94%   Weight: 199 lb 9.6 oz (90.5 kg)   Height: _1  (1.549 m)   Body mass index is 37.71 kg/m. Vitals with BMI 04/16/2020 04/16/2020 10/27/2019  Height - _2  -  Weight - 199 lbs 10 oz -  BMI - 357.01-  Systolic [id number] Diastolic 76 76 67  Pulse 74 75 74    Physical Exam Vitals reviewed.  Constitutional:      General: She is not in acute distress.    Appearance: She is well-developed. She is not diaphoretic.     Interventions: She is not intubated. HENT:     Head: Normocephalic and atraumatic.     Right Ear: External ear normal.     Left Ear: External ear normal.     Nose: Nose normal.     Mouth/Throat:     Pharynx: No oropharyngeal exudate.  Eyes:     General: Lids are normal. No scleral icterus.       Right eye: No discharge.        Left eye: No discharge.     Conjunctiva/sclera: Conjunctivae normal.     Right eye: Right conjunctiva is not injected. No exudate or hemorrhage.    Left eye: Left conjunctiva is not injected. No exudate or hemorrhage.    Pupils: Pupils are equal, round, and reactive to light.  Neck:     Thyroid: No thyroid mass or thyromegaly.     Vascular: Normal carotid pulses. No carotid bruit, hepatojugular reflux or JVD.     Trachea: Trachea and phonation normal. No tracheal tenderness or tracheal deviation.     Meningeal: Brudzinski's sign and Kernig's sign absent.  Cardiovascular:     Rate and Rhythm: Normal rate and  regular rhythm.     Pulses: Normal pulses.          Radial pulses are 2+ on the right side and 2+ on the left side.       Dorsalis pedis pulses  are 2+ on the right side and 2+ on the left side.       Posterior tibial pulses are 2+ on the right side and 2+ on the left side.     Heart sounds: Normal heart sounds, S1 normal and S2 normal. Heart sounds not distant. No murmur heard. No friction rub. No gallop.   Pulmonary:     Effort: Pulmonary effort is normal. No tachypnea, bradypnea, accessory muscle usage or respiratory distress. She is not intubated.     Breath sounds: Normal breath sounds. No stridor. No wheezing or rales.  Chest:     Chest wall: No tenderness.  Breasts:     Right: No supraclavicular adenopathy.     Left: No supraclavicular adenopathy.    Abdominal:     General: Bowel sounds are normal. There is no distension or abdominal bruit.     Palpations: Abdomen is soft. There is no shifting dullness, fluid wave, hepatomegaly, splenomegaly, mass or pulsatile mass.     Tenderness: There is no abdominal tenderness. There is no guarding or rebound.     Hernia: No hernia is present.  Musculoskeletal:        General: No tenderness or deformity. Normal range of motion.     Cervical back: Full passive range of motion without pain, normal range of motion and neck supple. No edema, erythema or rigidity. No spinous process tenderness or muscular tenderness. Normal range of motion.  Lymphadenopathy:     Head:     Right side of head: No submental, submandibular, tonsillar, preauricular, posterior auricular or occipital adenopathy.     Left side of head: No submental, submandibular, tonsillar, preauricular, posterior auricular or occipital adenopathy.     Cervical: No cervical adenopathy.     Right cervical: No superficial, deep or posterior cervical adenopathy.    Left cervical: No superficial, deep or posterior cervical adenopathy.     Upper Body:     Right upper body: No  supraclavicular or pectoral adenopathy.     Left upper body: No supraclavicular or pectoral adenopathy.  Skin:    General: Skin is warm and dry.     Coloration: Skin is not pale.     Findings: No abrasion, bruising, burn, ecchymosis, erythema, lesion, petechiae or rash.     Nails: There is no clubbing.  Neurological:     Mental Status: She is alert and oriented to person, place, and time.     GCS: GCS eye subscore is 4. GCS verbal subscore is 5. GCS motor subscore is 6.     Cranial Nerves: No cranial nerve deficit.     Sensory: No sensory deficit.     Motor: No tremor, atrophy, abnormal muscle tone or seizure activity.     Coordination: Coordination normal.     Gait: Gait normal.     Deep Tendon Reflexes: Reflexes are normal and symmetric. Reflexes normal. Babinski sign absent on the right side. Babinski sign absent on the left side.     Reflex Scores:      Tricep reflexes are 2+ on the right side and 2+ on the left side.      Bicep reflexes are 2+ on the right side and 2+ on the left side.      Brachioradialis reflexes are 2+ on the right side and 2+ on the left side.      Patellar reflexes are 2+ on the right side and 2+ on the left side.      Achilles reflexes are  2+ on the right side and 2+ on the left side. Psychiatric:        Speech: Speech normal.        Behavior: Behavior normal.        Thought Content: Thought content normal.        Judgment: Judgment normal.     Depression Screen PHQ 2/9 Scores 04/16/2020 07/18/2018 07/11/2018  PHQ - 2 Score 0 0 0  PHQ- 9 Score 0 0 0   Results for orders placed or performed in visit on 04/16/20  Urine Culture   Specimen: Urine   Urine  Result Value Ref Range   Urine Culture, Routine Final report (A)    Organism ID, Bacteria Klebsiella pneumoniae (A)    Antimicrobial Susceptibility Comment   CBC with Differential/Platelet  Result Value Ref Range   WBC 5.3 3.4 - 10.8 x10E3/uL   RBC 4.92 3.77 - 5.28 x10E6/uL   Hemoglobin 14.3 11.1 -  15.9 g/dL   Hematocrit 43.0 34.0 - 46.6 %   MCV 87 79 - 97 fL   MCH 29.1 26.6 - 33.0 pg   MCHC 33.3 31.5 - 35.7 g/dL   RDW 13.9 11.7 - 15.4 %   Platelets 138 (L) 150 - 450 x10E3/uL   Neutrophils 63 Not Estab. %   Lymphs 27 Not Estab. %   Monocytes 10 Not Estab. %   Eos 0 Not Estab. %   Basos 0 Not Estab. %   Neutrophils Absolute 3.3 1.4 - 7.0 x10E3/uL   Lymphocytes Absolute 1.5 0.7 - 3.1 x10E3/uL   Monocytes Absolute 0.5 0.1 - 0.9 x10E3/uL   EOS (ABSOLUTE) 0.0 0.0 - 0.4 x10E3/uL   Basophils Absolute 0.0 0.0 - 0.2 x10E3/uL   Immature Granulocytes 0 Not Estab. %   Immature Grans (Abs) 0.0 0.0 - 0.1 x10E3/uL  Comprehensive metabolic panel  Result Value Ref Range   Glucose 171 (H) 65 - 99 mg/dL   BUN 11 8 - 27 mg/dL   Creatinine, Ser 0.69 0.57 - 1.00 mg/dL   eGFR 94 >59 mL/min/1.73   BUN/Creatinine Ratio 16 12 - 28   Sodium 142 134 - 144 mmol/L   Potassium 4.6 3.5 - 5.2 mmol/L   Chloride 103 96 - 106 mmol/L   CO2 25 20 - 29 mmol/L   Calcium 9.3 8.7 - 10.3 mg/dL   Total Protein 7.1 6.0 - 8.5 g/dL   Albumin 4.6 3.8 - 4.8 g/dL   Globulin, Total 2.5 1.5 - 4.5 g/dL   Albumin/Globulin Ratio 1.8 1.2 - 2.2   Bilirubin Total 0.7 0.0 - 1.2 mg/dL   Alkaline Phosphatase 84 44 - 121 IU/L   AST 20 0 - 40 IU/L   ALT 15 0 - 32 IU/L  Lipid panel  Result Value Ref Range   Cholesterol, Total 179 100 - 199 mg/dL   Triglycerides 134 0 - 149 mg/dL   HDL 47 >39 mg/dL   VLDL Cholesterol Cal 24 5 - 40 mg/dL   LDL Chol Calc (NIH) 108 (H) 0 - 99 mg/dL   Chol/HDL Ratio 3.8 0.0 - 4.4 ratio  TSH  Result Value Ref Range   TSH 2.050 0.450 - 4.500 uIU/mL  Hemoglobin A1c  Result Value Ref Range   Hgb A1c MFr Bld 6.2 (H) 4.8 - 5.6 %   Est. average glucose Bld gHb Est-mCnc 131 mg/dL  POCT urinalysis dipstick  Result Value Ref Range   Color, UA yellow    Clarity, UA clear  Glucose, UA Negative Negative   Bilirubin, UA small    Ketones, UA negative    Spec Grav, UA >=1.030 (A) 1.010 - 1.025    Blood, UA negative    pH, UA 5.0 5.0 - 8.0   Protein, UA Negative Negative   Urobilinogen, UA 0.2 0.2 or 1.0 E.U./dL   Nitrite, UA negative    Leukocytes, UA Trace (A) Negative   Appearance     Odor      Assessment & Plan     1. Essential hypertension - CBC with Differential/Platelet - Comprehensive metabolic panel - Lipid panel - TSH - Hemoglobin A1c   2. Screening for blood or protein in urine  - POCT urinalysis dipstick  3. Leukocytes in urine Will send for culture.  - Urine Culture  4. Type 2 diabetes mellitus without complication, without long-term current use of insulin (HCC) Not taking medication at this time will check Hemoglobin A1C.   5. History of elevated glucose  - Urine Microalbumin w/creat. ratio  6. Hyperlipidemia, unspecified hyperlipidemia type Lipid panel. Diet and exercise.  Return in about 3 months (around 07/17/2020), or if symptoms worsen or fail to improve, for at any time for any worsening symptoms.     Red Flags discussed. The patient was given clear instructions to go to ER or return to medical center if any red flags develop, symptoms do not improve, worsen or new problems develop. They verbalized understanding.  The entirety of the information documented in the History of Present Illness, Review of Systems and Physical Exam were personally obtained by me. Portions of this information were initially documented by the CMA and reviewed by me for thoroughness and accuracy.     Marcille Buffy, [place] [phone] (phone) [phone] (fax)  [place]

## 2020-04-16 NOTE — Progress Notes (Signed)
Was sent for urine culture, trace bacteria in urine dip in office.

## 2020-04-16 NOTE — Patient Instructions (Addendum)
Nature conservation officer at [facility] [address] [place], [place] [address]  Main [phone] Fax: [phone] Hours (M-F): 8am - 5pm    managing Your Hypertension Hypertension, also called high blood pressure, is when the force of the blood pressing against the walls of the arteries is too strong. Arteries are blood vessels that carry blood from your heart throughout your body. Hypertension forces the heart to work harder to pump blood and may cause the arteries to become narrow or stiff. Understanding blood pressure readings Your personal target blood pressure may vary depending on your medical conditions, your age, and other factors. A blood pressure reading includes a higher number over a lower number. Ideally, your blood pressure should be below 120/80. You should know that:  The first, or top, number is called the systolic pressure. It is a measure of the pressure in your arteries as your heart beats.  The second, or bottom number, is called the diastolic pressure. It is a measure of the pressure in your arteries as the heart relaxes. Blood pressure is classified into four stages. Based on your blood pressure reading, your health care provider may use the following stages to determine what type of treatment you need, if any. Systolic pressure and diastolic pressure are measured in a unit called mmHg. Normal  Systolic pressure: below 120.  Diastolic pressure: below 80. Elevated  Systolic pressure: 120-129.  Diastolic pressure: below 80. Hypertension stage 1  Systolic pressure: 130-139.  Diastolic pressure: 80-89. Hypertension stage 2  Systolic pressure: 140 or above.  Diastolic pressure: 90 or above. How can this condition affect me? Managing your hypertension is an important responsibility. Over time, hypertension can damage the arteries and decrease blood flow to important parts of the body, including the brain, heart, and  kidneys. Having untreated or uncontrolled hypertension can lead to:  A heart attack.  A stroke.  A weakened blood vessel (aneurysm).  Heart failure.  Kidney damage.  Eye damage.  Metabolic syndrome.  Memory and concentration problems.  Vascular dementia. What actions can I take to manage this condition? Hypertension can be managed by making lifestyle changes and possibly by taking medicines. Your health care provider will help you make a plan to bring your blood pressure within a normal range. Nutrition  Eat a diet that is high in fiber and potassium, and low in salt (sodium), added sugar, and fat. An example eating plan is called the Dietary Approaches to Stop Hypertension (DASH) diet. To eat this way: ? Eat plenty of fresh fruits and vegetables. Try to fill one-half of your plate at each meal with fruits and vegetables. ? Eat whole grains, such as whole-wheat pasta, brown rice, or whole-grain bread. Fill about one-fourth of your plate with whole grains. ? Eat low-fat dairy products. ? Avoid fatty cuts of meat, processed or cured meats, and poultry with skin. Fill about one-fourth of your plate with lean proteins such as fish, chicken without skin, beans, eggs, and tofu. ? Avoid pre-made and processed foods. These tend to be higher in sodium, added sugar, and fat.  Reduce your daily sodium intake. Most people with hypertension should eat less than 1,500 mg of sodium a day.   Lifestyle  Work with your health care provider to maintain a healthy body weight or to lose weight. Ask what an ideal weight is for you.  Get at least 30 minutes of exercise that causes your heart to beat faster (aerobic exercise) most days of the week.  Activities may include walking, swimming, or biking.  Include exercise to strengthen your muscles (resistance exercise), such as weight lifting, as part of your weekly exercise routine. Try to do these types of exercises for 30 minutes at least 3 days a  week.  Do not use any products that contain nicotine or tobacco, such as cigarettes, e-cigarettes, and chewing tobacco. If you need help quitting, ask your health care provider.  Control any long-term (chronic) conditions you have, such as high cholesterol or diabetes.  Identify your sources of stress and find ways to manage stress. This may include meditation, deep breathing, or making time for fun activities.   Alcohol use  Do not drink alcohol if: ? Your health care provider tells you not to drink. ? You are pregnant, may be pregnant, or are planning to become pregnant.  If you drink alcohol: ? Limit how much you use to:  0-1 drink a day for women.  0-2 drinks a day for men. ? Be aware of how much alcohol is in your drink. In the [place] one drink equals one 12 oz bottle of beer (355 mL), one 5 oz glass of wine (148 mL), or one 1 oz glass of hard liquor (44 mL). Medicines Your health care provider may prescribe medicine if lifestyle changes are not enough to get your blood pressure under control and if:  Your systolic blood pressure is 130 or higher.  Your diastolic blood pressure is 80 or higher. Take medicines only as told by your health care provider. Follow the directions carefully. Blood pressure medicines must be taken as told by your health care provider. The medicine does not work as well when you skip doses. Skipping doses also puts you at risk for problems. Monitoring Before you monitor your blood pressure:  Do not smoke, drink caffeinated beverages, or exercise within 30 minutes before taking a measurement.  Use the bathroom and empty your bladder (urinate).  Sit quietly for at least 5 minutes before taking measurements. Monitor your blood pressure at home as told by your health care provider. To do this:  Sit with your back straight and supported.  Place your feet flat on the floor. Do not cross your legs.  Support your arm on a flat surface, such as a table.  Make sure your upper arm is at heart level.  Each time you measure, take two or three readings one minute apart and record the results. You may also need to have your blood pressure checked regularly by your health care provider.   General information  Talk with your health care provider about your diet, exercise habits, and other lifestyle factors that may be contributing to hypertension.  Review all the medicines you take with your health care provider because there may be side effects or interactions.  Keep all visits as told by your health care provider. Your health care provider can help you create and adjust your plan for managing your high blood pressure. Where to find more information  National Heart, Lung, and Blood Institute: PopSteam.is  American Heart Association: www.heart.org Contact a health care provider if:  You think you are having a reaction to medicines you have taken.  You have repeated (recurrent) headaches.  You feel dizzy.  You have swelling in your ankles.  You have trouble with your vision. Get help right away if:  You develop a severe headache or confusion.  You have unusual weakness or numbness, or you feel faint.  You have severe pain  in your chest or abdomen.  You vomit repeatedly.  You have trouble breathing. These symptoms may represent a serious problem that is an emergency. Do not wait to see if the symptoms will go away. Get medical help right away. Call your local emergency services (911 in the [place] Do not drive yourself to the hospital. Summary  Hypertension is when the force of blood pumping through your arteries is too strong. If this condition is not controlled, it may put you at risk for serious complications.  Your personal target blood pressure may vary depending on your medical conditions, your age, and other factors. For most people, a normal blood pressure is less than 120/80.  Hypertension is managed by lifestyle  changes, medicines, or both.  Lifestyle changes to help manage hypertension include losing weight, eating a healthy, low-sodium diet, exercising more, stopping smoking, and limiting alcohol. This information is not intended to replace advice given to you by your health care provider. Make sure you discuss any questions you have with your health care provider. Document Revised: 03/03/2019 Document Reviewed: 12/27/2018 Elsevier Patient Education  2021 [employer] Health Maintenance, Female Adopting a healthy lifestyle and getting preventive care are important in promoting health and wellness. Ask your health care provider about:  The right schedule for you to have regular tests and exams.  Things you can do on your own to prevent diseases and keep yourself healthy. What should I know about diet, weight, and exercise? Eat a healthy diet  Eat a diet that includes plenty of vegetables, fruits, low-fat dairy products, and lean protein.  Do not eat a lot of foods that are high in solid fats, added sugars, or sodium.   Maintain a healthy weight Body mass index (BMI) is used to identify weight problems. It estimates body fat based on height and weight. Your health care provider can help determine your BMI and help you achieve or maintain a healthy weight. Get regular exercise Get regular exercise. This is one of the most important things you can do for your health. Most adults should:  Exercise for at least 150 minutes each week. The exercise should increase your heart rate and make you sweat (moderate-intensity exercise).  Do strengthening exercises at least twice a week. This is in addition to the moderate-intensity exercise.  Spend less time sitting. Even light physical activity can be beneficial. Watch cholesterol and blood lipids Have your blood tested for lipids and cholesterol at 10920 years of age, then have this test every 5 years. Have your cholesterol levels checked more often  if:  Your lipid or cholesterol levels are high.  You are older than 69 years of age.  You are at high risk for heart disease. What should I know about cancer screening? Depending on your health history and family history, you may need to have cancer screening at various ages. This may include screening for:  Breast cancer.  Cervical cancer.  Colorectal cancer.  Skin cancer.  Lung cancer. What should I know about heart disease, diabetes, and high blood pressure? Blood pressure and heart disease  High blood pressure causes heart disease and increases the risk of stroke. This is more likely to develop in people who have high blood pressure readings, are of African descent, or are overweight.  Have your blood pressure checked: ? Every 3-5 years if you are 2118-69 years of age. ? Every year if you are 69 years old or older. Diabetes Have regular diabetes screenings. This checks your fasting  blood sugar level. Have the screening done:  Once every three years after age 31 if you are at a normal weight and have a low risk for diabetes.  More often and at a younger age if you are overweight or have a high risk for diabetes. What should I know about preventing infection? Hepatitis B If you have a higher risk for hepatitis B, you should be screened for this virus. Talk with your health care provider to find out if you are at risk for hepatitis B infection. Hepatitis C Testing is recommended for:  Everyone born from [birth year] through 1964.  Anyone with known risk factors for hepatitis C. Sexually transmitted infections (STIs)  Get screened for STIs, including gonorrhea and chlamydia, if: ? You are sexually active and are younger than 69 years of age. ? You are older than 69 years of age and your health care provider tells you that you are at risk for this type of infection. ? Your sexual activity has changed since you were last screened, and you are at increased risk for chlamydia or  gonorrhea. Ask your health care provider if you are at risk.  Ask your health care provider about whether you are at high risk for HIV. Your health care provider may recommend a prescription medicine to help prevent HIV infection. If you choose to take medicine to prevent HIV, you should first get tested for HIV. You should then be tested every 3 months for as long as you are taking the medicine. Pregnancy  If you are about to stop having your period (premenopausal) and you may become pregnant, seek counseling before you get pregnant.  Take 400 to 800 micrograms (mcg) of folic acid every day if you become pregnant.  Ask for birth control (contraception) if you want to prevent pregnancy. Osteoporosis and menopause Osteoporosis is a disease in which the bones lose minerals and strength with aging. This can result in bone fractures. If you are 45 years old or older, or if you are at risk for osteoporosis and fractures, ask your health care provider if you should:  Be screened for bone loss.  Take a calcium or vitamin D supplement to lower your risk of fractures.  Be given hormone replacement therapy (HRT) to treat symptoms of menopause. Follow these instructions at home: Lifestyle  Do not use any products that contain nicotine or tobacco, such as cigarettes, e-cigarettes, and chewing tobacco. If you need help quitting, ask your health care provider.  Do not use street drugs.  Do not share needles.  Ask your health care provider for help if you need support or information about quitting drugs. Alcohol use  Do not drink alcohol if: ? Your health care provider tells you not to drink. ? You are pregnant, may be pregnant, or are planning to become pregnant.  If you drink alcohol: ? Limit how much you use to 0-1 drink a day. ? Limit intake if you are breastfeeding.  Be aware of how much alcohol is in your drink. In the [place] one drink equals one 12 oz bottle of beer (355 mL), one 5 oz  glass of wine (148 mL), or one 1 oz glass of hard liquor (44 mL). General instructions  Schedule regular health, dental, and eye exams.  Stay current with your vaccines.  Tell your health care provider if: ? You often feel depressed. ? You have ever been abused or do not feel safe at home. Summary  Adopting a healthy lifestyle and  getting preventive care are important in promoting health and wellness.  Follow your health care provider's instructions about healthy diet, exercising, and getting tested or screened for diseases.  Follow your health care provider's instructions on monitoring your cholesterol and blood pressure. This information is not intended to replace advice given to you by your health care provider. Make sure you discuss any questions you have with your health care provider. Document Revised: 01/19/2018 Document Reviewed: 01/19/2018 Elsevier Patient Education  2021 [employer].

## 2020-04-17 LAB — HEMOGLOBIN A1C
Est. average glucose Bld gHb Est-mCnc: 131 mg/dL
Hgb A1c MFr Bld: 6.2 % — ABNORMAL HIGH (ref 4.8–5.6)

## 2020-04-17 LAB — COMPREHENSIVE METABOLIC PANEL
ALT: 15 IU/L (ref 0–32)
AST: 20 IU/L (ref 0–40)
Albumin/Globulin Ratio: 1.8 (ref 1.2–2.2)
Albumin: 4.6 g/dL (ref 3.8–4.8)
Alkaline Phosphatase: 84 IU/L (ref 44–121)
BUN/Creatinine Ratio: 16 (ref 12–28)
BUN: 11 mg/dL (ref 8–27)
Bilirubin Total: 0.7 mg/dL (ref 0.0–1.2)
CO2: 25 mmol/L (ref 20–29)
Calcium: 9.3 mg/dL (ref 8.7–10.3)
Chloride: 103 mmol/L (ref 96–106)
Creatinine, Ser: 0.69 mg/dL (ref 0.57–1.00)
Globulin, Total: 2.5 g/dL (ref 1.5–4.5)
Glucose: 171 mg/dL — ABNORMAL HIGH (ref 65–99)
Potassium: 4.6 mmol/L (ref 3.5–5.2)
Sodium: 142 mmol/L (ref 134–144)
Total Protein: 7.1 g/dL (ref 6.0–8.5)
eGFR: 94 mL/min/{1.73_m2} (ref >59)

## 2020-04-17 LAB — CBC WITH DIFFERENTIAL/PLATELET
Basophils Absolute: 0 10*3/uL (ref 0.0–0.2)
Basos: 0 %
EOS (ABSOLUTE): 0 10*3/uL (ref 0.0–0.4)
Eos: 0 %
Hematocrit: 43 % (ref 34.0–46.6)
Hemoglobin: 14.3 g/dL (ref 11.1–15.9)
Immature Grans (Abs): 0 10*3/uL (ref 0.0–0.1)
Immature Granulocytes: 0 %
Lymphocytes Absolute: 1.5 10*3/uL (ref 0.7–3.1)
Lymphs: 27 %
MCH: 29.1 pg (ref 26.6–33.0)
MCHC: 33.3 g/dL (ref 31.5–35.7)
MCV: 87 fL (ref 79–97)
Monocytes Absolute: 0.5 10*3/uL (ref 0.1–0.9)
Monocytes: 10 %
Neutrophils Absolute: 3.3 10*3/uL (ref 1.4–7.0)
Neutrophils: 63 %
Platelets: 138 10*3/uL — ABNORMAL LOW (ref 150–450)
RBC: 4.92 x10E6/uL (ref 3.77–5.28)
RDW: 13.9 % (ref 11.7–15.4)
WBC: 5.3 10*3/uL (ref 3.4–10.8)

## 2020-04-17 LAB — LIPID PANEL
Chol/HDL Ratio: 3.8 ratio (ref 0.0–4.4)
Cholesterol, Total: 179 mg/dL (ref 100–199)
HDL: 47 mg/dL (ref >39)
LDL Chol Calc (NIH): 108 mg/dL — ABNORMAL HIGH (ref 0–99)
Triglycerides: 134 mg/dL (ref 0–149)
VLDL Cholesterol Cal: 24 mg/dL (ref 5–40)

## 2020-04-17 LAB — TSH: TSH: 2.05 u[IU]/mL (ref 0.450–4.500)

## 2020-04-19 LAB — URINE CULTURE

## 2020-04-20 ENCOUNTER — Other Ambulatory Visit: Payer: Self-pay | Admitting: Adult Health

## 2020-04-20 DIAGNOSIS — I83023 Varicose veins of left lower extremity with ulcer of ankle: Secondary | ICD-10-CM | POA: Insufficient documentation

## 2020-04-20 DIAGNOSIS — N390 Urinary tract infection, site not specified: Secondary | ICD-10-CM | POA: Insufficient documentation

## 2020-04-20 DIAGNOSIS — L97321 Non-pressure chronic ulcer of left ankle limited to breakdown of skin: Secondary | ICD-10-CM | POA: Insufficient documentation

## 2020-04-20 MED ORDER — CIPROFLOXACIN HCL 500 MG PO TABS: 500.0000 mg | ORAL_TABLET | Freq: Two times a day (BID) | ORAL | 0 refills | Status: DC

## 2020-04-20 NOTE — Progress Notes (Signed)
CBC ok, platelets slightly low, stable improved from one year ago. CMP is within normal except glucose is elevated at 171.Hemoglobin A1C is 6.2 prediabetes range, she can try dieatary changes and increased exercise or we can add back Metformin 500 mg once daily to see if  glucose is stable. Recheck CMP and Hemoglobin A1C in around 3 months. Can print script for glucose meter per insurance to keep check on levels of glucose if she does not have.   LDL elevated.  Discuss lifestyle modification with patient e.g. increase exercise, fiber, fruits, vegetables, lean meat, and omega 3/fish intake and decrease saturated fat.  If patient following strict diet and exercise program already please schedule follow up appointment with primary care physician TSH for thyroid within normal limits.   Infection was found in urine culture will send Cipro 500 mg twice daily for 5 days. Meds ordered this encounter Medications  ciprofloxacin (CIPRO) 500 MG tablet   Sig: Take 1 tablet (500 mg total) by mouth 2 (two) times daily.   Dispense:  10 tablet   Refill:  0

## 2020-04-20 NOTE — Progress Notes (Signed)
Meds ordered this encounter  °Medications  ° ciprofloxacin (CIPRO) 500 MG tablet  °  Sig: Take 1 tablet (500 mg total) by mouth 2 (two) times daily.  °  Dispense:  10 tablet  °  Refill:  0  °  °

## 2020-07-17 ENCOUNTER — Encounter: Payer: Self-pay | Admitting: Adult Health

## 2020-07-17 ENCOUNTER — Other Ambulatory Visit: Payer: Self-pay

## 2020-07-17 ENCOUNTER — Ambulatory Visit (INDEPENDENT_AMBULATORY_CARE_PROVIDER_SITE_OTHER): Payer: Medicare Other | Admitting: Adult Health

## 2020-07-17 VITALS — BP 162/84 | HR 83 | Temp 97.5°F | Ht 60.98 in | Wt 194.4 lb

## 2020-07-17 DIAGNOSIS — I10 Essential (primary) hypertension: Secondary | ICD-10-CM | POA: Diagnosis not present

## 2020-07-17 DIAGNOSIS — E785 Hyperlipidemia, unspecified: Secondary | ICD-10-CM | POA: Diagnosis not present

## 2020-07-17 DIAGNOSIS — Z6835 Body mass index (BMI) 35.0-35.9, adult: Secondary | ICD-10-CM

## 2020-07-17 DIAGNOSIS — R7303 Prediabetes: Secondary | ICD-10-CM | POA: Diagnosis not present

## 2020-07-17 DIAGNOSIS — Z6836 Body mass index (BMI) 36.0-36.9, adult: Secondary | ICD-10-CM | POA: Diagnosis not present

## 2020-07-17 DIAGNOSIS — Z6837 Body mass index (BMI) 37.0-37.9, adult: Secondary | ICD-10-CM | POA: Insufficient documentation

## 2020-07-17 HISTORY — DX: Body mass index (BMI) 35.0-35.9, adult: Z68.35

## 2020-07-17 MED ORDER — LOSARTAN POTASSIUM 50 MG PO TABS: 50.0000 mg | ORAL_TABLET | Freq: Every day | ORAL | 1 refills | Status: DC

## 2020-07-17 NOTE — Progress Notes (Signed)
Acute Office Visit  Subjective:    Patient ID: Erika Perry, female    DOB: Feb 21, 1950, 70 y.o.   MRN: [id number]  Chief Complaint  Patient presents with  . Follow-up    HPI Patient is in today for hypertension follow up. She has losartan on her medication list but never has taken it. Today blood pressure is 162/84. She agrees to start Losartan. Denies any edema. Denies any shortness of breath. " feels well".  Hemoglobin A1C in labs was improved as well as cholesterol improved, she prefers to work on this without medication and do diet and lifestyle changes.  Urinary tract infection treated with Cipro at last visit, patient reports doing well, denies any residual symptoms. Declined retesting.   Patient  denies any fever, body aches,chills, rash, chest pain, shortness of breath, nausea, vomiting, or diarrhea.  Denies dizziness, lightheadedness, pre syncopal or syncopal episodes.   She did have eye exam, and she had glasses prescribed " new prescription". Past Medical History:  Diagnosis Date  . Hypertension     Past Surgical History:  Procedure Laterality Date  . CESAREAN SECTION  1981, 1990    Family History  Problem Relation Age of Onset  . Cancer Mother   . Diabetes Mother   . Hypertension Mother   . Other Father        unknown medical history    Social History   Socioeconomic History  . Marital status: Widowed    Spouse name: Not on file  . Number of children: Not on file  . Years of education: Not on file  . Highest education level: Not on file  Occupational History  . Not on file  Tobacco Use  . Smoking status: Never Smoker  . Smokeless tobacco: Never Used  Vaping Use  . Vaping Use: Never used  Substance and Sexual Activity  . Alcohol use: No  . Drug use: Never  . Sexual activity: Not Currently  Other Topics Concern  . Not on file  Social History Narrative  . Not on file   Social Determinants of Health   Financial Resource Strain: Not on file   Food Insecurity: Not on file  Transportation Needs: Not on file  Physical Activity: Not on file  Stress: Not on file  Social Connections: Not on file  Intimate Partner Violence: Not on file    Outpatient Medications Prior to Visit  Medication Sig Dispense Refill  . ciprofloxacin (CIPRO) 500 MG tablet Take 1 tablet (500 mg total) by mouth 2 (two) times daily. (Patient not taking: Reported on 07/17/2020) 10 tablet 0   No facility-administered medications prior to visit.    Allergies  Allergen Reactions  . Bactrim [Sulfamethoxazole-Trimethoprim]     Nausea, no appitite  . Penicillins     Other reaction(s): Nausea/Vomiting ()    Review of Systems  Constitutional: Negative.   HENT: Negative.   Respiratory: Negative.   Cardiovascular: Negative.   Gastrointestinal: Negative.   Genitourinary: Negative.   Musculoskeletal: Negative.        Objective:    Physical Exam Vitals reviewed.  Constitutional:      General: She is not in acute distress.    Appearance: She is well-developed. She is obese. She is not ill-appearing, toxic-appearing or diaphoretic.     Interventions: She is not intubated. HENT:     Head: Normocephalic and atraumatic.     Right Ear: External ear normal.     Left Ear: External ear normal.  Nose: Nose normal.     Mouth/Throat:     Pharynx: No oropharyngeal exudate.  Eyes:     General: Lids are normal. No scleral icterus.       Right eye: No discharge.        Left eye: No discharge.     Conjunctiva/sclera: Conjunctivae normal.     Right eye: Right conjunctiva is not injected. No exudate or hemorrhage.    Left eye: Left conjunctiva is not injected. No exudate or hemorrhage.    Pupils: Pupils are equal, round, and reactive to light.  Neck:     Thyroid: No thyroid mass or thyromegaly.     Vascular: Normal carotid pulses. No carotid bruit, hepatojugular reflux or JVD.     Trachea: Trachea and phonation normal. No tracheal tenderness or tracheal  deviation.     Meningeal: Brudzinski's sign and Kernig's sign absent.  Cardiovascular:     Rate and Rhythm: Normal rate and regular rhythm.     Pulses: Normal pulses.          Radial pulses are 2+ on the right side and 2+ on the left side.       Dorsalis pedis pulses are 2+ on the right side and 2+ on the left side.       Posterior tibial pulses are 2+ on the right side and 2+ on the left side.     Heart sounds: Normal heart sounds, S1 normal and S2 normal. Heart sounds not distant. No murmur heard. No friction rub. No gallop.   Pulmonary:     Effort: Pulmonary effort is normal. No tachypnea, bradypnea, accessory muscle usage or respiratory distress. She is not intubated.     Breath sounds: Normal breath sounds. No wheezing, rhonchi or rales.  Chest:     Chest wall: No tenderness.  Breasts:     Right: No supraclavicular adenopathy.     Left: No supraclavicular adenopathy.    Abdominal:     General: Bowel sounds are normal. There is no distension or abdominal bruit.     Palpations: Abdomen is soft. There is no shifting dullness, fluid wave, hepatomegaly, splenomegaly, mass or pulsatile mass.     Tenderness: There is no abdominal tenderness. There is no right CVA tenderness, left CVA tenderness, guarding or rebound.     Hernia: No hernia is present.  Musculoskeletal:        General: No tenderness or deformity. Normal range of motion.     Cervical back: Full passive range of motion without pain, normal range of motion and neck supple. No edema, erythema or rigidity. No spinous process tenderness or muscular tenderness. Normal range of motion.  Lymphadenopathy:     Head:     Right side of head: No submental, submandibular, tonsillar, preauricular, posterior auricular or occipital adenopathy.     Left side of head: No submental, submandibular, tonsillar, preauricular, posterior auricular or occipital adenopathy.     Cervical: No cervical adenopathy.     Right cervical: No superficial,  deep or posterior cervical adenopathy.    Left cervical: No superficial, deep or posterior cervical adenopathy.     Upper Body:     Right upper body: No supraclavicular or pectoral adenopathy.     Left upper body: No supraclavicular or pectoral adenopathy.  Skin:    General: Skin is warm and dry.     Coloration: Skin is not pale.     Findings: No abrasion, bruising, burn, ecchymosis, erythema, lesion, petechiae or rash.  Nails: There is no clubbing.  Neurological:     Mental Status: She is alert and oriented to person, place, and time.     GCS: GCS eye subscore is 4. GCS verbal subscore is 5. GCS motor subscore is 6.     Cranial Nerves: No cranial nerve deficit.     Sensory: No sensory deficit.     Motor: No weakness, tremor, atrophy, abnormal muscle tone or seizure activity.     Coordination: Coordination normal.     Gait: Gait normal.     Deep Tendon Reflexes: Reflexes are normal and symmetric. Reflexes normal. Babinski sign absent on the right side. Babinski sign absent on the left side.     Reflex Scores:      Tricep reflexes are 2+ on the right side and 2+ on the left side.      Bicep reflexes are 2+ on the right side and 2+ on the left side.      Brachioradialis reflexes are 2+ on the right side and 2+ on the left side.      Patellar reflexes are 2+ on the right side and 2+ on the left side.      Achilles reflexes are 2+ on the right side and 2+ on the left side. Psychiatric:        Speech: Speech normal.        Behavior: Behavior normal.        Thought Content: Thought content normal.        Judgment: Judgment normal.     BP (!) 162/84   Pulse 83   Temp (!) 97.5 F (36.4 C)   Ht 5' 0.98" (1.549 m)   Wt 194 lb 6.4 oz (88.2 kg)   SpO2 94%   BMI 36.75 kg/m  Wt Readings from Last 3 Encounters:  07/17/20 194 lb 6.4 oz (88.2 kg)  04/16/20 199 lb 9.6 oz (90.5 kg)  10/23/19 193 lb (87.5 kg)    Health Maintenance Due  Topic Date Due  . URINE MICROALBUMIN  Never done     There are no preventive care reminders to display for this patient.   Lab Results  Component Value Date   TSH 2.050 04/16/2020   Lab Results  Component Value Date   WBC 5.3 04/16/2020   HGB 14.3 04/16/2020   HCT 43.0 04/16/2020   MCV 87 04/16/2020   PLT 138 (L) 04/16/2020   Lab Results  Component Value Date   NA 142 04/16/2020   K 4.6 04/16/2020   CO2 25 04/16/2020   GLUCOSE 171 (H) 04/16/2020   BUN 11 04/16/2020   CREATININE 0.69 04/16/2020   BILITOT 0.7 04/16/2020   ALKPHOS 84 04/16/2020   AST 20 04/16/2020   ALT 15 04/16/2020   PROT 7.1 04/16/2020   ALBUMIN 4.6 04/16/2020   CALCIUM 9.3 04/16/2020   ANIONGAP 8 10/10/2012   EGFR 94 04/16/2020   GFR 96.00 07/11/2018   Lab Results  Component Value Date   CHOL 179 04/16/2020   Lab Results  Component Value Date   HDL 47 04/16/2020   Lab Results  Component Value Date   LDLCALC 108 (H) 04/16/2020   Lab Results  Component Value Date   TRIG 134 04/16/2020   Lab Results  Component Value Date   CHOLHDL 3.8 04/16/2020   Lab Results  Component Value Date   HGBA1C 6.2 (H) 04/16/2020       Assessment & Plan:   Problem List Items Addressed This Visit  Cardiovascular and Mediastinum   Hypertension - Primary   Relevant Medications   losartan (COZAAR) 50 MG tablet   Other Relevant Orders   Comprehensive metabolic panel     Other   Hyperlipidemia   Relevant Medications   losartan (COZAAR) 50 MG tablet   Other Relevant Orders   Lipid Panel w/o Chol/HDL Ratio   BMI 36.0-36.9,adult   Prediabetes   Relevant Orders   Hemoglobin A1c     1. Hypertension, unspecified type  - losartan (COZAAR) 50 MG tablet; Take 1 tablet (50 mg total) by mouth daily.  Dispense: 90 tablet; Refill: 1 - Comprehensive metabolic panel; Future - Hemoglobin A1c; Future  2. BMI 36.0-36.9,adult The patient is advised to begin progressive daily aerobic exercise program, follow a low fat, low cholesterol diet, attempt to  lose weight, reduce salt in diet and cooking, reduce exposure to stress, use calcium 1 gram daily with Vit D, continue current medications and continue current healthy lifestyle patterns.  3. Hyperlipidemia, unspecified hyperlipidemia type She prefers diet and lifestyle changes.   4. Prediabetes Prefers diet and lifestyle changes. Recheck lipid panel and hemoglobin A1C in 3 months.   Meds ordered this encounter  Medications  . losartan (COZAAR) 50 MG tablet    Sig: Take 1 tablet (50 mg total) by mouth daily.    Dispense:  90 tablet    Refill:  1    Return in about 1 month (around 08/16/2020), or if symptoms worsen or fail to improve, for at any time for any worsening symptoms, Go to Emergency room/ urgent care if worse. [place] Darin Arndt, FNP   Red Flags discussed. The patient was given clear instructions to go to ER or return to medical center if any red flags develop, symptoms do not improve, worsen or new problems develop. They verbalized understanding.

## 2020-07-17 NOTE — Patient Instructions (Addendum)
Preventing High Cholesterol Cholesterol is a white, waxy substance similar to fat that the human body needs to help build cells. The liver makes all the cholesterol that a person's body needs. Having high cholesterol (hypercholesterolemia) increases your risk for heart disease and stroke. Extra or excess cholesterol comes from the food that you eat. High cholesterol can often be prevented with diet and lifestyle changes. If you already have high cholesterol, you can control it with diet, lifestyle changes, and medicines. How can high cholesterol affect me? If you have high cholesterol, fatty deposits (plaques) may build up on the walls of your blood vessels. The blood vessels that carry blood away from your heart are called arteries. Plaques make the arteries narrower and stiffer. This in turn can:  Restrict or block blood flow and cause blood clots to form.  Increase your risk for heart attack and stroke. What can increase my risk for high cholesterol? This condition is more likely to develop in people who:  Eat foods that are high in saturated fat or cholesterol. Saturated fat is mostly found in foods that come from animal sources.  Are overweight.  Are not getting enough exercise.  Have a family history of high cholesterol (familial hypercholesterolemia). What actions can I take to prevent this? Nutrition  Eat less saturated fat.  Avoid trans fats (partially hydrogenated oils). These are often found in margarine and in some baked goods, fried foods, and snacks bought in packages.  Avoid precooked or cured meat, such as bacon, sausages, or meat loaves.  Avoid foods and drinks that have added sugars.  Eat more fruits, vegetables, and whole grains.  Choose healthy sources of protein, such as fish, poultry, lean cuts of red meat, beans, peas, lentils, and nuts.  Choose healthy sources of fat, such as: ? Nuts. ? Vegetable oils, especially olive oil. ? Fish that have healthy  fats, such as omega-3 fatty acids. These fish include mackerel or salmon.   Lifestyle  Lose weight if you are overweight. Maintaining a healthy body mass index (BMI) can help prevent or control high cholesterol. It can also lower your risk for diabetes and high blood pressure. Ask your health care provider to help you with a diet and exercise plan to lose weight safely.  Do not use any products that contain nicotine or tobacco, such as cigarettes, e-cigarettes, and chewing tobacco. If you need help quitting, ask your health care provider. Alcohol use  Do not drink alcohol if: ? Your health care provider tells you not to drink. ? You are pregnant, may be pregnant, or are planning to become pregnant.  If you drink alcohol: ? Limit how much you use to:  0-1 drink a day for women.  0-2 drinks a day for men. ? Be aware of how much alcohol is in your drink. In the [place] one drink equals one 12 oz bottle of beer (355 mL), one 5 oz glass of wine (148 mL), or one 1 oz glass of hard liquor (44 mL). Activity  Get enough exercise. Do exercises as told by your health care provider.  Each week, do at least 150 minutes of exercise that takes a medium level of effort (moderate-intensity exercise). This kind of exercise: ? Makes your heart beat faster while allowing you to still be able to talk. ? Can be done in short sessions several times a day or longer sessions a few times a week. For example, on 5 days each week, you could walk fast  or ride your bike 3 times a day for 10 minutes each time.   Medicines  Your health care provider may recommend medicines to help lower cholesterol. This may be a medicine to lower the amount of cholesterol that your liver makes. You may need medicine if: ? Diet and lifestyle changes have not lowered your cholesterol enough. ? You have high cholesterol and other risk factors for heart disease or stroke.  Take over-the-counter and prescription medicines only as told by  your health care provider. General information  Manage your risk factors for high cholesterol. Talk with your health care provider about all your risk factors and how to lower your risk.  Manage other conditions that you have, such as diabetes or high blood pressure (hypertension).  Have blood tests to check your cholesterol levels at regular points in time as told by your health care provider.  Keep all follow-up visits as told by your health care provider. This is important. Where to find more information  American Heart Association: www.heart.org  National Heart, Lung, and Blood Institute: PopSteam.is Summary  High cholesterol increases your risk for heart disease and stroke. By keeping your cholesterol level low, you can reduce your risk for these conditions.  High cholesterol can often be prevented with diet and lifestyle changes.  Work with your health care provider to manage your risk factors, and have your blood tested regularly. This information is not intended to replace advice given to you by your health care provider. Make sure you discuss any questions you have with your health care provider. Document Revised: 11/08/2018 Document Reviewed: 11/08/2018 Elsevier Patient Education  2021 Elsevier Inc.  High Cholesterol  High cholesterol is a condition in which the blood has high levels of a white, waxy substance similar to fat (cholesterol). The liver makes all the cholesterol that the body needs. The human body needs small amounts of cholesterol to help build cells. A person gets extra or excess cholesterol from the food that he or she eats. The blood carries cholesterol from the liver to the rest of the body. If you have high cholesterol, deposits (plaques) may build up on the walls of your arteries. Arteries are the blood vessels that carry blood away from your heart. These plaques make the arteries narrow and stiff. Cholesterol plaques increase your risk for heart  attack and stroke. Work with your health care provider to keep your cholesterol levels in a healthy range. What increases the risk? The following factors may make you more likely to develop this condition:  Eating foods that are high in animal fat (saturated fat) or cholesterol.  Being overweight.  Not getting enough exercise.  A family history of high cholesterol (familial hypercholesterolemia).  Use of tobacco products.  Having diabetes. What are the signs or symptoms? There are no symptoms of this condition. How is this diagnosed? This condition may be diagnosed based on the results of a blood test.  If you are older than 69 years of age, your health care provider may check your cholesterol levels every 4-6 years.  You may be checked more often if you have high cholesterol or other risk factors for heart disease. The blood test for cholesterol measures:  "Bad" cholesterol, or LDL cholesterol. This is the main type of cholesterol that causes heart disease. The desired level is less than 100 mg/dL.  "Good" cholesterol, or HDL cholesterol. HDL helps protect against heart disease by cleaning the arteries and carrying the LDL to the liver for processing.  The desired level for HDL is 60 mg/dL or higher.  Triglycerides. These are fats that your body can store or burn for energy. The desired level is less than 150 mg/dL.  Total cholesterol. This measures the total amount of cholesterol in your blood and includes LDL, HDL, and triglycerides. The desired level is less than 200 mg/dL. How is this treated? This condition may be treated with:  Diet changes. You may be asked to eat foods that have more fiber and less saturated fats or added sugar.  Lifestyle changes. These may include regular exercise, maintaining a healthy weight, and quitting use of tobacco products.  Medicines. These are given when diet and lifestyle changes have not worked. You may be prescribed a statin medicine to  help lower your cholesterol levels. Follow these instructions at home: Eating and drinking  Eat a healthy, balanced diet. This diet includes: ? Daily servings of a variety of fresh, frozen, or canned fruits and vegetables. ? Daily servings of whole grain foods that are rich in fiber. ? Foods that are low in saturated fats and trans fats. These include poultry and fish without skin, lean cuts of meat, and low-fat dairy products. ? A variety of fish, especially oily fish that contain omega-3 fatty acids. Aim to eat fish at least 2 times a week.  Avoid foods and drinks that have added sugar.  Use healthy cooking methods, such as roasting, grilling, broiling, baking, poaching, steaming, and stir-frying. Do not fry your food except for stir-frying.   Lifestyle  Get regular exercise. Aim to exercise for a total of 150 minutes a week. Increase your activity level by doing activities such as gardening, walking, and taking the stairs.  Do not use any products that contain nicotine or tobacco, such as cigarettes, e-cigarettes, and chewing tobacco. If you need help quitting, ask your health care provider.   General instructions  Take over-the-counter and prescription medicines only as told by your health care provider.  Keep all follow-up visits as told by your health care provider. This is important. Where to find more information  American Heart Association: www.heart.org  National Heart, Lung, and Blood Institute: PopSteam.is Contact a health care provider if:  You have trouble achieving or maintaining a healthy diet or weight.  You are starting an exercise program.  You are unable to stop smoking. Get help right away if:  You have chest pain.  You have trouble breathing.  You have any symptoms of a stroke. "BE FAST" is an easy way to remember the main warning signs of a stroke: ? B - Balance. Signs are dizziness, sudden trouble walking, or loss of balance. ? E - Eyes. Signs  are trouble seeing or a sudden change in vision. ? F - Face. Signs are sudden weakness or numbness of the face, or the face or eyelid drooping on one side. ? A - Arms. Signs are weakness or numbness in an arm. This happens suddenly and usually on one side of the body. ? S - Speech. Signs are sudden trouble speaking, slurred speech, or trouble understanding what people say. ? T - Time. Time to call emergency services. Write down what time symptoms started.  You have other signs of a stroke, such as: ? A sudden, severe headache with no known cause. ? Nausea or vomiting. ? Seizure. These symptoms may represent a serious problem that is an emergency. Do not wait to see if the symptoms will go away. Get medical help right away. Call  your local emergency services (911 in the [place] Do not drive yourself to the hospital. Summary  Cholesterol plaques increase your risk for heart attack and stroke. Work with your health care provider to keep your cholesterol levels in a healthy range.  Eat a healthy, balanced diet, get regular exercise, and maintain a healthy weight.  Do not use any products that contain nicotine or tobacco, such as cigarettes, e-cigarettes, and chewing tobacco.  Get help right away if you have any symptoms of a stroke. This information is not intended to replace advice given to you by your health care provider. Make sure you discuss any questions you have with your health care provider. Document Revised: 12/26/2018 Document Reviewed: 12/26/2018 Elsevier Patient Education  2021 Elsevier Inc. Prediabetes Eating Plan Prediabetes is a condition that causes blood sugar (glucose) levels to be higher than normal. This increases the risk for developing type 2 diabetes (type 2 diabetes mellitus). Working with a health care provider or nutrition specialist (dietitian) to make diet and lifestyle changes can help prevent the onset of diabetes. These changes may help you:  Control your blood  glucose levels.  Improve your cholesterol levels.  Manage your blood pressure. What are tips for following this plan? Reading food labels  Read food labels to check the amount of fat, salt (sodium), and sugar in prepackaged foods. Avoid foods that have: ? Saturated fats. ? Trans fats. ? Added sugars.  Avoid foods that have more than 300 milligrams (mg) of sodium per serving. Limit your sodium intake to less than 2,300 mg each day. Shopping  Avoid buying pre-made and processed foods.  Avoid buying drinks with added sugar. Cooking  Cook with olive oil. Do not use butter, lard, or ghee.  Bake, broil, grill, steam, or boil foods. Avoid frying. Meal planning  Work with your dietitian to create an eating plan that is right for you. This may include tracking how many calories you take in each day. Use a food diary, notebook, or mobile application to track what you eat at each meal.  Consider following a Mediterranean diet. This includes: ? Eating several servings of fresh fruits and vegetables each day. ? Eating fish at least twice a week. ? Eating one serving each day of whole grains, beans, nuts, and seeds. ? Using olive oil instead of other fats. ? Limiting alcohol. ? Limiting red meat. ? Using nonfat or low-fat dairy products.  Consider following a plant-based diet. This includes dietary choices that focus on eating mostly vegetables and fruit, grains, beans, nuts, and seeds.  If you have high blood pressure, you may need to limit your sodium intake or follow a diet such as the DASH (Dietary Approaches to Stop Hypertension) eating plan. The DASH diet aims to lower high blood pressure.   Lifestyle  Set weight loss goals with help from your health care team. It is recommended that most people with prediabetes lose 7% of their body weight.  Exercise for at least 30 minutes 5 or more days a week.  Attend a support group or seek support from a mental health counselor.  Take  over-the-counter and prescription medicines only as told by your health care provider. What foods are recommended? Fruits Berries. Bananas. Apples. Oranges. Grapes. Papaya. Mango. Pomegranate. Kiwi. Grapefruit. Cherries. Vegetables Lettuce. Spinach. Peas. Beets. Cauliflower. Cabbage. Broccoli. Carrots. Tomatoes. Squash. Eggplant. Herbs. Peppers. Onions. Cucumbers. Brussels sprouts. Grains Whole grains, such as whole-wheat or whole-grain breads, crackers, cereals, and pasta. Unsweetened oatmeal. Bulgur. Barley. Quinoa. [place]  rice. Corn or whole-wheat flour tortillas or taco shells. Meats and other proteins Seafood. Poultry without skin. Lean cuts of pork and beef. Tofu. Eggs. Nuts. Beans. Dairy Low-fat or fat-free dairy products, such as yogurt, cottage cheese, and cheese. Beverages Water. Tea. Coffee. Sugar-free or diet soda. Seltzer water. Low-fat or nonfat milk. Milk alternatives, such as soy or almond milk. Fats and oils Olive oil. Canola oil. Sunflower oil. Grapeseed oil. Avocado. Walnuts. Sweets and desserts Sugar-free or low-fat pudding. Sugar-free or low-fat ice cream and other frozen treats. Seasonings and condiments Herbs. Sodium-free spices. Mustard. Relish. Low-salt, low-sugar ketchup. Low-salt, low-sugar barbecue sauce. Low-fat or fat-free mayonnaise. The items listed above may not be a complete list of recommended foods and beverages. Contact a dietitian for more information. What foods are not recommended? Fruits Fruits canned with syrup. Vegetables Canned vegetables. Frozen vegetables with butter or cream sauce. Grains Refined white flour and flour products, such as bread, pasta, snack foods, and cereals. Meats and other proteins Fatty cuts of meat. Poultry with skin. Breaded or fried meat. Processed meats. Dairy Full-fat yogurt, cheese, or milk. Beverages Sweetened drinks, such as iced tea and soda. Fats and oils Butter. Lard. Ghee. Sweets and desserts Baked  goods, such as cake, cupcakes, pastries, cookies, and cheesecake. Seasonings and condiments Spice mixes with added salt. Ketchup. Barbecue sauce. Mayonnaise. The items listed above may not be a complete list of foods and beverages that are not recommended. Contact a dietitian for more information. Where to find more information  American Diabetes Association: www.diabetes.org Summary  You may need to make diet and lifestyle changes to help prevent the onset of diabetes. These changes can help you control blood sugar, improve cholesterol levels, and manage blood pressure.  Set weight loss goals with help from your health care team. It is recommended that most people with prediabetes lose 7% of their body weight.  Consider following a Mediterranean diet. This includes eating plenty of fresh fruits and vegetables, whole grains, beans, nuts, seeds, fish, and low-fat dairy, and using olive oil instead of other fats. This information is not intended to replace advice given to you by your health care provider. Make sure you discuss any questions you have with your health care provider. Document Revised: 04/27/2019 Document Reviewed: 04/27/2019 Elsevier Patient Education  2021 Elsevier Inc. Losartan Tablets What is this medicine? LOSARTAN (loe SAR tan) is an angiotensin II receptor blocker, also known as an ARB. It treats high blood pressure. It can slow kidney damage in some patients. It may also be used to lower the risk of stroke. This medicine may be used for other purposes; ask your health care provider or pharmacist if you have questions. COMMON BRAND NAME(S): Cozaar What should I tell my health care provider before I take this medicine? They need to know if you have any of these conditions:  heart failure  kidney disease  liver disease  an unusual or allergic reaction to losartan, other medicines, foods, dyes, or preservatives  pregnant or trying to get  pregnant  breast-feeding How should I use this medicine? Take this medicine by mouth. Take it as directed on the prescription label at the same time every day. You can take it with or without food. If it upsets your stomach, take it with food. Keep taking it unless your health care provider tells you to stop. Talk to your health care provider about the use of this medicine in children. While it may be prescribed for children as young as  6 for selected conditions, precautions do apply. Overdosage: If you think you have taken too much of this medicine contact a poison control center or emergency room at once. NOTE: This medicine is only for you. Do not share this medicine with others. What if I miss a dose? If you miss a dose, take it as soon as you can. If it is almost time for your next dose, take only that dose. Do not take double or extra doses. What may interact with this medicine?  aliskiren  ACE inhibitors, like enalapril or lisinopril  diuretics, especially amiloride, eplerenone, spironolactone, or triamterene  lithium  NSAIDs, medicines for pain and inflammation, like ibuprofen or naproxen  potassium salts or potassium supplements This list may not describe all possible interactions. Give your health care provider a list of all the medicines, herbs, non-prescription drugs, or dietary supplements you use. Also tell them if you smoke, drink alcohol, or use illegal drugs. Some items may interact with your medicine. What should I watch for while using this medicine? Visit your health care provider for regular check ups. Check your blood pressure as directed. Ask your health care provider what your blood pressure should be. Also, find out when you should contact him or her. Do not treat yourself for coughs, colds, or pain while you are using this medicine without asking your health care provider for advice. Some medicines may increase your blood pressure. Women should inform their health  care provider if they wish to become pregnant or think they might be pregnant. There is a potential for serious side effects to an unborn child. Talk to your health care provider for more information. You may get drowsy or dizzy. Do not drive, use machinery, or do anything that needs mental alertness until you know how this medicine affects you. Do not stand or sit up quickly, especially if you are an older patient. This reduces the risk of dizzy or fainting spells. Alcohol can make you more drowsy and dizzy. Avoid alcoholic drinks. Avoid salt substitutes unless you are told otherwise by your health care provider. What side effects may I notice from receiving this medicine? Side effects that you should report to your doctor or health care professional as soon as possible:  allergic reactions (skin rash, itching or hives, swelling of the hands, feet, face, lips, throat, or tongue)  breathing problems  high potassium levels (chest pain; or fast, irregular heartbeat; muscle weakness)  kidney injury (trouble passing urine or change in the amount of urine)  low blood pressure (dizziness; feeling faint or lightheaded, falls; unusually weak or tired) Side effects that usually do not require medical attention (report to your doctor or health care professional if they continue or are bothersome):  cough  headache  nasal congestion or stuffiness  nausea or stomach pain This list may not describe all possible side effects. Call your doctor for medical advice about side effects. You may report side effects to FDA at 1-800-FDA-1088. Where should I keep my medicine? Keep out of the reach of children and pets. Store at room temperature between 20 and 25 degrees C (68 and 77 degrees F). Protect from light. Keep the container tightly closed. Get rid of any unused medicine after the expiration date. To get rid of medicines that are no longer needed or have expired:  Take the medicine to a medicine  take-back program. Check with your pharmacy or law enforcement to find a location.  If you cannot return the medicine, check the  label or package insert to see if the medicine should be thrown out in the garbage or flushed down the toilet. If you are not sure, ask your health care provider. If it is safe to put in the trash, empty the medicine out of the container. Mix the medicine with cat litter, dirt, coffee grounds, or other unwanted substance. Seal the mixture in a bag or container. Put it in the trash. NOTE: This sheet is a summary. It may not cover all possible information. If you have questions about this medicine, talk to your doctor, pharmacist, or health care provider.  2021 Elsevier/Gold Standard (2019-04-06 16:16:09)

## 2020-08-19 ENCOUNTER — Ambulatory Visit: Payer: Medicare Other | Admitting: Adult Health

## 2020-10-15 ENCOUNTER — Ambulatory Visit: Payer: Medicare Other | Admitting: Adult Health

## 2020-10-21 ENCOUNTER — Other Ambulatory Visit (INDEPENDENT_AMBULATORY_CARE_PROVIDER_SITE_OTHER): Payer: Medicare Other

## 2020-10-21 ENCOUNTER — Other Ambulatory Visit: Payer: Self-pay

## 2020-10-21 DIAGNOSIS — I10 Essential (primary) hypertension: Secondary | ICD-10-CM

## 2020-10-21 DIAGNOSIS — R7303 Prediabetes: Secondary | ICD-10-CM | POA: Diagnosis not present

## 2020-10-21 DIAGNOSIS — E785 Hyperlipidemia, unspecified: Secondary | ICD-10-CM

## 2020-10-21 LAB — COMPREHENSIVE METABOLIC PANEL
ALT: 14 U/L (ref 0–35)
AST: 15 U/L (ref 0–37)
Albumin: 4 g/dL (ref 3.5–5.2)
Alkaline Phosphatase: 65 U/L (ref 39–117)
BUN: 11 mg/dL (ref 6–23)
CO2: 26 mEq/L (ref 19–32)
Calcium: 8.7 mg/dL (ref 8.4–10.5)
Chloride: 104 mEq/L (ref 96–112)
Creatinine, Ser: 0.62 mg/dL (ref 0.40–1.20)
GFR: 90.93 mL/min (ref >60.00)
Glucose, Bld: 131 mg/dL — ABNORMAL HIGH (ref 70–99)
Potassium: 4.1 mEq/L (ref 3.5–5.1)
Sodium: 139 mEq/L (ref 135–145)
Total Bilirubin: 1 mg/dL (ref 0.2–1.2)
Total Protein: 6.5 g/dL (ref 6.0–8.3)

## 2020-10-21 LAB — HEMOGLOBIN A1C: Hgb A1c MFr Bld: 6.4 % (ref 4.6–6.5)

## 2020-10-22 LAB — LIPID PANEL W/O CHOL/HDL RATIO
Cholesterol, Total: 173 mg/dL (ref 100–199)
HDL: 39 mg/dL — ABNORMAL LOW (ref >39)
LDL Chol Calc (NIH): 115 mg/dL — ABNORMAL HIGH (ref 0–99)
Triglycerides: 106 mg/dL (ref 0–149)
VLDL Cholesterol Cal: 19 mg/dL (ref 5–40)

## 2020-11-11 ENCOUNTER — Ambulatory Visit: Payer: Medicare Other | Admitting: Adult Health

## 2020-11-19 ENCOUNTER — Telehealth: Payer: Self-pay | Admitting: Adult Health

## 2020-11-19 NOTE — Telephone Encounter (Signed)
Patient is calling in to see if you can go over her September lab results with her.Please call her at [phone].

## 2020-11-25 NOTE — Telephone Encounter (Signed)
Patient is calling in to check on the status of the message below.Please call her at [phone] to go over her lab results.

## 2020-11-26 NOTE — Telephone Encounter (Signed)
Pt notified of lab results

## 2020-11-26 NOTE — Telephone Encounter (Signed)
Returned pt's call regarding lab results.

## 2020-12-23 ENCOUNTER — Ambulatory Visit: Payer: Medicare Other | Admitting: Adult Health

## 2020-12-24 ENCOUNTER — Ambulatory Visit: Payer: Self-pay | Admitting: Adult Health

## 2020-12-24 ENCOUNTER — Ambulatory Visit (INDEPENDENT_AMBULATORY_CARE_PROVIDER_SITE_OTHER): Payer: Medicare Other | Admitting: Adult Health

## 2020-12-24 ENCOUNTER — Other Ambulatory Visit: Payer: Self-pay

## 2020-12-24 ENCOUNTER — Encounter: Payer: Self-pay | Admitting: Adult Health

## 2020-12-24 VITALS — BP 142/88 | HR 83 | Temp 95.7°F | Ht 60.98 in | Wt 192.6 lb

## 2020-12-24 DIAGNOSIS — I10 Essential (primary) hypertension: Secondary | ICD-10-CM

## 2020-12-24 DIAGNOSIS — L309 Dermatitis, unspecified: Secondary | ICD-10-CM

## 2020-12-24 DIAGNOSIS — E119 Type 2 diabetes mellitus without complications: Secondary | ICD-10-CM | POA: Diagnosis not present

## 2020-12-24 DIAGNOSIS — E785 Hyperlipidemia, unspecified: Secondary | ICD-10-CM

## 2020-12-24 DIAGNOSIS — R7303 Prediabetes: Secondary | ICD-10-CM

## 2020-12-24 DIAGNOSIS — Z6836 Body mass index (BMI) 36.0-36.9, adult: Secondary | ICD-10-CM

## 2020-12-24 LAB — MICROALBUMIN / CREATININE URINE RATIO
Creatinine,U: 101.3 mg/dL
Microalb Creat Ratio: 0.9 mg/g (ref 0.0–30.0)
Microalb, Ur: 0.9 mg/dL (ref 0.0–1.9)

## 2020-12-24 LAB — HEMOGLOBIN A1C: Hgb A1c MFr Bld: 6.2 % (ref 4.6–6.5)

## 2020-12-24 LAB — COMPREHENSIVE METABOLIC PANEL
ALT: 14 U/L (ref 0–35)
AST: 18 U/L (ref 0–37)
Albumin: 4.4 g/dL (ref 3.5–5.2)
Alkaline Phosphatase: 61 U/L (ref 39–117)
BUN: 18 mg/dL (ref 6–23)
CO2: 29 mEq/L (ref 19–32)
Calcium: 9.2 mg/dL (ref 8.4–10.5)
Chloride: 104 mEq/L (ref 96–112)
Creatinine, Ser: 0.74 mg/dL (ref 0.40–1.20)
GFR: 82.51 mL/min (ref >60.00)
Glucose, Bld: 88 mg/dL (ref 70–99)
Potassium: 4 mEq/L (ref 3.5–5.1)
Sodium: 140 mEq/L (ref 135–145)
Total Bilirubin: 1 mg/dL (ref 0.2–1.2)
Total Protein: 7.2 g/dL (ref 6.0–8.3)

## 2020-12-24 LAB — CBC WITH DIFFERENTIAL/PLATELET
Basophils Absolute: 0 10*3/uL (ref 0.0–0.1)
Basophils Relative: 0.1 % (ref 0.0–3.0)
Eosinophils Absolute: 0 10*3/uL (ref 0.0–0.7)
Eosinophils Relative: 0 % (ref 0.0–5.0)
HCT: 40.2 % (ref 36.0–46.0)
Hemoglobin: 13.1 g/dL (ref 12.0–15.0)
Lymphocytes Relative: 26.4 % (ref 12.0–46.0)
Lymphs Abs: 1.5 10*3/uL (ref 0.7–4.0)
MCHC: 32.6 g/dL (ref 30.0–36.0)
MCV: 87.2 fl (ref 78.0–100.0)
Monocytes Absolute: 0.6 10*3/uL (ref 0.1–1.0)
Monocytes Relative: 10.8 % (ref 3.0–12.0)
Neutro Abs: 3.4 10*3/uL (ref 1.4–7.7)
Neutrophils Relative %: 62.7 % (ref 43.0–77.0)
Platelets: 132 10*3/uL — ABNORMAL LOW (ref 150.0–400.0)
RBC: 4.61 Mil/uL (ref 3.87–5.11)
RDW: 15.2 % (ref 11.5–15.5)
WBC: 5.5 10*3/uL (ref 4.0–10.5)

## 2020-12-24 LAB — TSH: TSH: 2.54 u[IU]/mL (ref 0.35–5.50)

## 2020-12-24 MED ORDER — TRIAMCINOLONE ACETONIDE 0.5 % EX OINT: 1.0000 "application " | TOPICAL_OINTMENT | Freq: Two times a day (BID) | CUTANEOUS | 0 refills | Status: DC

## 2020-12-24 MED ORDER — HYDROCHLOROTHIAZIDE 12.5 MG PO CAPS: 12.5000 mg | ORAL_CAPSULE | Freq: Every day | ORAL | 1 refills | Status: DC

## 2020-12-24 NOTE — Patient Instructions (Addendum)
Eucerin emollient lotion keep hands moisturized/     Hydrochlorothiazide Capsules or Tablets What is this medication? HYDROCHLOROTHIAZIDE (hye droe klor oh THYE a zide) treats high blood pressure. It may also be used to reduce swelling related to heart, kidney, or liver disease. It helps your kidneys remove more fluid and salt from your blood through the urine. It belongs to a group of medications called diuretics. This medicine may be used for other purposes; ask your health care provider or pharmacist if you have questions. COMMON BRAND NAME(S): Esidrix, Ezide, HydroDIURIL, Microzide, Oretic, Zide What should I tell my care team before I take this medication? They need to know if you have any of these conditions: Diabetes Gout Kidney disease Liver disease Lupus Pancreatitis An unusual or allergic reaction to hydrochlorothiazide, sulfa drugs, other medications, foods, dyes, or preservatives Pregnant or trying to get pregnant Breast-feeding How should I use this medication? Take this medication by mouth. Take it as directed on the prescription label at the same time every day. You can take it with or without food. If it upsets your stomach, take it with food. Keep taking it unless your care team tells you to stop. Talk to your care team about the use of this medication in children. While it may be prescribed for children as young as newborns for selected conditions, precautions do apply. Overdosage: If you think you have taken too much of this medicine contact a poison control center or emergency room at once. NOTE: This medicine is only for you. Do not share this medicine with others. What if I miss a dose? If you miss a dose, take it as soon as you can. If it is almost time for your next dose, take only that dose. Do not take double or extra doses. What may interact with this medication? Cholestyramine Colestipol Digoxin Dofetilide Lithium Medications for blood  pressure Medications for diabetes Medications that relax muscles for surgery Other diuretics Steroid medications like prednisone or cortisone This list may not describe all possible interactions. Give your health care provider a list of all the medicines, herbs, non-prescription drugs, or dietary supplements you use. Also tell them if you smoke, drink alcohol, or use illegal drugs. Some items may interact with your medicine. What should I watch for while using this medication? Visit your health care provider for regular check-ups. Check your blood pressure as directed. Ask your health care provider what your blood pressure should be. Also, find out when you should contact him or her. Do not treat yourself for coughs, colds, or pain while you are using this medication without asking your health care provider for advice. Some medications may increase your blood pressure. You may get drowsy or dizzy. Do not drive, use machinery, or do anything that needs mental alertness until you know how this medication affects you. Do not stand or sit up quickly, especially if you are an older patient. This reduces the risk of dizzy or fainting spells. Alcohol can make you more drowsy and dizzy. Avoid alcoholic drinks. Talk to your health care professional about your risk of skin cancer. You may be more at risk for skin cancer if you take this medication. This medication can make you more sensitive to the sun. Keep out of the sun. If you cannot avoid being in the sun, wear protective clothing and use sunscreen. Do not use sun lamps or tanning beds/booths. You may need to be on a special diet while taking this medication. Ask your health care  provider. Also, find out how many glasses of fluids you need to drink each day. Check with your health care provider if you get an attack of severe diarrhea, nausea and vomiting, or if you sweat a lot. The loss of too much body fluid can make it dangerous for you to take this  medication. This medication may increase blood sugar. Ask your healthcare provider if changes in diet or medications are needed if you have diabetes. What side effects may I notice from receiving this medication? Side effects that you should report to your care team as soon as possible: Allergic reactions--skin rash, itching, hives, swelling of the face, lips, tongue, or throat Dehydration--increased thirst, dry mouth, feeling faint or lightheaded, headache, dark yellow or brown urine Gout--severe pain, redness, warmth, or swelling in joints, such as the big toe Kidney injury--decrease in the amount of urine, swelling of the ankles, hands, or feet Low blood pressure--dizziness, feeling faint or lightheaded, blurry vision Low potassium level--muscle pain or cramps, unusual weakness, fatigue, fast or irregular heartbeat, constipation Sudden eye pain or change in vision such as blurred vision, seeing halos around lights, vision loss Side effects that usually do not require medical attention (report to your care team if they continue or are bothersome): Change in sex drive or performance Headache Upset stomach This list may not describe all possible side effects. Call your doctor for medical advice about side effects. You may report side effects to FDA at 1-800-FDA-1088. Where should I keep my medication? Keep out of the reach of children and pets. Store at room temperature between 20 and 25 degrees C (68 and 77 degrees F). Protect from light and moisture. Keep the container tightly closed. Do not freeze. Get rid of any unused medication after the expiration date. To get rid of medications that are no longer needed or have expired: Take the medication to a medication take-back program. Check with your pharmacy or law enforcement to find a location. If you cannot return the medication, check the label or package insert to see if the medication should be thrown out in the garbage or flushed down the  toilet. If you are not sure, ask your care team. If it is safe to put in the trash, empty the medication out of the container. Mix the medication with cat litter, dirt, coffee grounds, or other unwanted substance. Seal the mixture in a bag or container. Put it in the trash. NOTE: This sheet is a summary. It may not cover all possible information. If you have questions about this medicine, talk to your doctor, pharmacist, or health care provider.  2022 Elsevier/Gold Standard (2020-10-15 00:00:00) Eczema Eczema refers to a group of skin conditions that cause skin to become rough and inflamed. Each type of eczema has different triggers, symptoms, and treatments. Eczema of any type is usually itchy. Symptoms range from mild to severe. Eczema is not spread from person to person (is not contagious). It can appear on different parts of the body at different times. One person's eczema may look different from another person's eczema. What are the causes? The exact cause of this condition is not known. However, exposure to certain environmental factors, irritants, and allergens can make the condition worse. What are the signs or symptoms? Symptoms of this condition depend on the type of eczema you have. The types include: Contact dermatitis. There are two kinds: Irritant contact dermatitis. This happens when something irritates the skin and causes a rash. Allergic contact dermatitis. This happens when  your skin comes in contact with something you are allergic to (allergens). This can include poison ivy, chemicals, or medicines that were applied to your skin. Atopic dermatitis. This is a long-term (chronic) skin disease that keeps coming back (recurring). It is the most common type of eczema. Usual symptoms are a red rash and itchy, dry, scaly skin. It usually starts showing signs in infancy and can last through adulthood. Dyshidrotic eczema. This is a form of eczema on the hands and feet. It shows up as very  itchy, fluid-filled blisters. It can affect people of any age but is more common before age 80. Hand eczema. This causes very itchy areas of skin on the palms and sides of the hands and fingers. This type of eczema is common in industrial jobs where you may be exposed to different types of irritants. Lichen simplex chronicus. This type of eczema occurs when a person constantly scratches one area of the body. Repeated scratching of the area leads to thickened skin (lichenification). This condition can accompany other types of eczema. It is more common in adults but may also be seen in children. Nummular eczema. This is a common type of eczema that most often affects the lower legs and the backs of the hands. It typically causes an itchy, red, circular, crusty lesion (plaque). Scratching may become a habit and can cause bleeding. Nummular eczema occurs most often in middle-aged or older people. Seborrheic dermatitis. This is a common skin disease that mainly affects the scalp. It may also affect other oily areas of the body, such as the face, sides of the nose, eyebrows, ears, eyelids, and chest. It is marked by small scaling and redness of the skin (erythema). This can affect people of all ages. In infants, this condition is called cradle cap. Stasis dermatitis. This is a common skin disease that can cause itching, scaling, and hyperpigmentation, usually on the legs and feet. It occurs most often in people who have a condition that prevents blood from being pumped through the veins in the legs (chronic venous insufficiency). Stasis dermatitis is a chronic condition that needs long-term management. How is this diagnosed? This condition may be diagnosed based on: A physical exam of your skin. Your medical history. Skin patch tests. These tests involve using patches that contain possible allergens and placing them on your back. Your health care provider will check in a few days to see if an allergic reaction  occurred. How is this treated? Treatment for eczema is based on the type of eczema you have. You may be given hydrocortisone steroid medicine or antihistamines. These can relieve itching quickly and help reduce inflammation. These may be prescribed or purchased over the counter, depending on the strength that is needed. Follow these instructions at home: Take or apply over-the-counter and prescription medicines only as told by your health care provider. Use creams or ointments to moisturize your skin. Do not use lotions. Learn what triggers or irritates your symptoms so you can avoid these things. Treat symptom flare-ups quickly. Do not scratch your skin. This can make your rash worse. Keep all follow-up visits. This is important. Where to find more information American Academy of Dermatology: MarketingSheets.si National Eczema Association: nationaleczema.org The Society for Pediatric Dermatology: pedsderm.net Contact a health care provider if: You have severe itching, even with treatment. You scratch your skin regularly until it bleeds. Your rash looks different than usual. Your skin is painful, swollen, or more red than usual. You have a fever. Summary Eczema  refers to a group of skin conditions that cause skin to become rough and inflamed. Each type has different triggers. Eczema of any type causes itching that may range from mild to severe. Treatment varies based on the type of eczema you have. Hydrocortisone steroid medicine or antihistamines can help with itching and inflammation. Protecting your skin is the best way to prevent eczema. Use creams or ointments to moisturize your skin. Avoid triggers and irritants. Treat flare-ups quickly. This information is not intended to replace advice given to you by your health care provider. Make sure you discuss any questions you have with your health care provider. Document Revised: 11/06/2019 Document Reviewed: 11/06/2019 Elsevier Patient Education   2022 [employer].

## 2020-12-24 NOTE — Progress Notes (Signed)
Acute Office Visit  Subjective:    Patient ID: Erika Perry, female    DOB: 1950-09-18, 70 y.o.   MRN: [id number]  Chief Complaint  Patient presents with   Follow-up    Hypertension This is a recurrent (follow up) problem. The current episode started more than 1 year ago. The problem has been gradually improving since onset. The problem is uncontrolled. Pertinent negatives include no anxiety, blurred vision, chest pain, headaches, malaise/fatigue, neck pain, orthopnea, palpitations, peripheral edema, PND, shortness of breath or sweats. There are no associated agents to hypertension.  Last A1c was lower - prediabetes range she is working on diet and exercise does not take medications at this time does not desire.  She feels well.  Patient  denies any fever, body aches,chills, rash, chest pain, shortness of breath, nausea, vomiting, or diarrhea.  She has no other concerns  Eye exam up to date Denies any orthopnea, or edema.   Past Medical History:  Diagnosis Date   Hypertension     Past Surgical History:  Procedure Laterality Date   CESAREAN SECTION  1981, 7    Family History  Problem Relation Age of Onset   Cancer Mother    Diabetes Mother    Hypertension Mother    Other Father        unknown medical history    Social History   Socioeconomic History   Marital status: Widowed    Spouse name: Not on file   Number of children: Not on file   Years of education: Not on file   Highest education level: Not on file  Occupational History   Not on file  Tobacco Use   Smoking status: Never   Smokeless tobacco: Never  Vaping Use   Vaping Use: Never used  Substance and Sexual Activity   Alcohol use: No   Drug use: Never   Sexual activity: Not Currently  Other Topics Concern   Not on file  Social History Narrative   Not on file   Social Determinants of Health   Financial Resource Strain: Not on file  Food Insecurity: Not on file  Transportation Needs: Not  on file  Physical Activity: Not on file  Stress: Not on file  Social Connections: Not on file  Intimate Partner Violence: Not on file    Outpatient Medications Prior to Visit  Medication Sig Dispense Refill   losartan (COZAAR) 50 MG tablet Take 1 tablet (50 mg total) by mouth daily. 90 tablet 1   No facility-administered medications prior to visit.    Allergies  Allergen Reactions   Bactrim [Sulfamethoxazole-Trimethoprim]     Nausea, no appitite   Penicillins     Other reaction(s): Nausea/Vomiting ()    Review of Systems  Constitutional: Negative.  Negative for malaise/fatigue.  HENT: Negative.    Eyes: Negative.  Negative for blurred vision.  Respiratory: Negative.  Negative for shortness of breath.   Cardiovascular: Negative.  Negative for chest pain, palpitations, orthopnea and PND.  Endocrine: Negative for polydipsia, polyphagia and polyuria.  Genitourinary: Negative.   Musculoskeletal: Negative.  Negative for neck pain and neck stiffness.  Skin:  Positive for rash (history of dry cracking hands at times and worse in winter, none right noe, asks for kenalog cream to use PRN when hands cracking, has used in past sparingly with good relief. Marland Kitchen). Negative for color change, pallor and wound.  Neurological: Negative.  Negative for headaches.  Psychiatric/Behavioral: Negative.  Objective:    Physical Exam Vitals reviewed.  Constitutional:      General: She is not in acute distress.    Appearance: Normal appearance. She is not ill-appearing, toxic-appearing or diaphoretic.  HENT:     Head: Normocephalic and atraumatic.     Right Ear: External ear normal.     Left Ear: External ear normal.     Nose: Nose normal.     Mouth/Throat:     Mouth: Mucous membranes are moist.  Eyes:     Conjunctiva/sclera: Conjunctivae normal.  Cardiovascular:     Rate and Rhythm: Normal rate and regular rhythm.     Pulses: Normal pulses.     Heart sounds: Normal heart sounds. No  murmur heard.   No friction rub. No gallop.  Pulmonary:     Effort: Pulmonary effort is normal. No respiratory distress.     Breath sounds: Normal breath sounds. No stridor. No wheezing, rhonchi or rales.  Chest:     Chest wall: No tenderness.  Abdominal:     General: There is no distension.     Palpations: Abdomen is soft.     Tenderness: There is no abdominal tenderness.  Musculoskeletal:        General: Normal range of motion.     Cervical back: Normal range of motion and neck supple.  Lymphadenopathy:     Cervical: No cervical adenopathy.  Skin:    General: Skin is warm.     Findings: No erythema or rash.  Psychiatric:        Mood and Affect: Mood normal.        Behavior: Behavior normal.        Thought Content: Thought content normal.        Judgment: Judgment normal.    BP (!) 142/88   Pulse 83   Temp (!) 95.7 F (35.4 C)   Ht 5' 0.98" (1.549 m)   Wt 192 lb 9.6 oz (87.4 kg)   SpO2 98%   BMI 36.41 kg/m  Wt Readings from Last 3 Encounters:  12/24/20 192 lb 9.6 oz (87.4 kg)  07/17/20 194 lb 6.4 oz (88.2 kg)  04/16/20 199 lb 9.6 oz (90.5 kg)   Vitals with BMI 12/24/2020 07/17/2020 04/16/2020  Height 5' 0.984" 5' 0.984" -  Weight 192 lbs 10 oz 194 lbs 6 oz -  BMI 59.45 85.92 -  Systolic 924 462 863  Diastolic 88 84 76  Pulse 83 83 74    There are no preventive care reminders to display for this patient.   There are no preventive care reminders to display for this patient.   Lab Results  Component Value Date   TSH 2.050 04/16/2020   Lab Results  Component Value Date   WBC 5.3 04/16/2020   HGB 14.3 04/16/2020   HCT 43.0 04/16/2020   MCV 87 04/16/2020   PLT 138 (L) 04/16/2020   Lab Results  Component Value Date   NA 139 10/21/2020   K 4.1 10/21/2020   CO2 26 10/21/2020   GLUCOSE 131 (H) 10/21/2020   BUN 11 10/21/2020   CREATININE 0.62 10/21/2020   BILITOT 1.0 10/21/2020   ALKPHOS 65 10/21/2020   AST 15 10/21/2020   ALT 14 10/21/2020   PROT 6.5  10/21/2020   ALBUMIN 4.0 10/21/2020   CALCIUM 8.7 10/21/2020   ANIONGAP 8 10/10/2012   EGFR 94 04/16/2020   GFR 90.93 10/21/2020   Lab Results  Component Value Date   CHOL 173  10/21/2020   Lab Results  Component Value Date   HDL 39 (L) 10/21/2020   Lab Results  Component Value Date   LDLCALC 115 (H) 10/21/2020   Lab Results  Component Value Date   TRIG 106 10/21/2020   Lab Results  Component Value Date   CHOLHDL 3.8 04/16/2020   Lab Results  Component Value Date   HGBA1C 6.4 10/21/2020       Assessment & Plan:   Problem List Items Addressed This Visit       Endocrine   Type 2 diabetes mellitus without complication, without long-term current use of insulin ([place])   Relevant Orders   Hemoglobin A1c   Urine Microalbumin w/creat. ratio     Other   Hyperlipidemia   Relevant Medications   hydrochlorothiazide (MICROZIDE) 12.5 MG capsule   Prediabetes   Other Visit Diagnoses     Essential hypertension    -  Primary   Relevant Medications   hydrochlorothiazide (MICROZIDE) 12.5 MG capsule   Other Relevant Orders   CBC with Differential/Platelet   Comprehensive metabolic panel   TSH   Eczema, unspecified type       Relevant Medications   triamcinolone ointment (KENALOG) 0.5 %   Body mass index (BMI) of 36.0-36.9 in adult            Meds ordered this encounter  Medications   triamcinolone ointment (KENALOG) 0.5 %    Sig: Apply 1 application topically 2 (two) times daily.    Dispense:  30 g    Refill:  0   hydrochlorothiazide (MICROZIDE) 12.5 MG capsule    Sig: Take 1 capsule (12.5 mg total) by mouth daily.    Dispense:  90 capsule    Refill:  1   Labs ordered.  Continue diet and exercise for prediabetes.  Will add fluid pill as above  take in the morning to Cozaar 50 mg po qd she is currently taking. Monitor blood pressure at home keep log, blood pressure parameters discussed and when to call or be seen.  Red Flags discussed. The patient was given  clear instructions to go to ER or return to medical center if any red flags develop, symptoms do not improve, worsen or new problems develop. They verbalized understanding.   Return in about 3 months (around 03/26/2021), or if symptoms worsen or fail to improve, for at any time for any worsening symptoms, Go to Emergency room/ urgent care if worse.   Marcille Buffy, FNP

## 2020-12-25 ENCOUNTER — Other Ambulatory Visit: Payer: Self-pay | Admitting: Adult Health

## 2020-12-25 ENCOUNTER — Telehealth: Payer: Self-pay

## 2020-12-25 DIAGNOSIS — D696 Thrombocytopenia, unspecified: Secondary | ICD-10-CM

## 2020-12-25 NOTE — Progress Notes (Signed)
Urine micro albumin within normal limits.  Hemoglobin A1C is 6.2  prediabetes fasting glucose is within normal limits/ continue diet and exercise.  CMP shows kidney and liver function as well as electrolytes within normal limits.  Platelets low but stable from two years ago otherwise CBC within normal limits. Would like her to be evaluated by hematology for platelets if she is in agreement ?

## 2020-12-25 NOTE — Telephone Encounter (Signed)
Pt returning call

## 2020-12-25 NOTE — Telephone Encounter (Signed)
Placed call to pt to see if she is agreeable to referral for hematology. LMTCB

## 2020-12-26 NOTE — Telephone Encounter (Signed)
Pt given lab results 

## 2021-03-24 ENCOUNTER — Encounter: Payer: Self-pay | Admitting: Adult Health

## 2021-03-26 ENCOUNTER — Ambulatory Visit: Payer: Medicare Other | Admitting: Adult Health

## 2021-03-31 ENCOUNTER — Encounter: Payer: Self-pay | Admitting: Adult Health

## 2021-04-01 ENCOUNTER — Ambulatory Visit: Payer: Medicare Other | Admitting: Adult Health

## 2021-04-09 ENCOUNTER — Ambulatory Visit (INDEPENDENT_AMBULATORY_CARE_PROVIDER_SITE_OTHER): Payer: Medicare Other | Admitting: Adult Health

## 2021-04-09 ENCOUNTER — Encounter: Payer: Self-pay | Admitting: Adult Health

## 2021-04-09 ENCOUNTER — Other Ambulatory Visit: Payer: Self-pay

## 2021-04-09 VITALS — BP 124/74 | HR 74 | Temp 98.0°F | Ht 60.0 in | Wt 192.8 lb

## 2021-04-09 DIAGNOSIS — I10 Essential (primary) hypertension: Secondary | ICD-10-CM | POA: Diagnosis not present

## 2021-04-09 DIAGNOSIS — E119 Type 2 diabetes mellitus without complications: Secondary | ICD-10-CM

## 2021-04-09 DIAGNOSIS — Z6837 Body mass index (BMI) 37.0-37.9, adult: Secondary | ICD-10-CM

## 2021-04-09 MED ORDER — LOSARTAN POTASSIUM 50 MG PO TABS: 50.0000 mg | ORAL_TABLET | Freq: Every day | ORAL | 2 refills | Status: DC

## 2021-04-09 NOTE — Patient Instructions (Signed)

## 2021-04-09 NOTE — Progress Notes (Signed)
? ?Established Patient Office Visit ? ?Subjective:  ?Patient ID: Erika Perry, female    DOB: [birth year]  Age: 71 y.o. MRN: [id number] ? ?CC:  ?Chief Complaint  ?Patient presents with  ? Follow-up  ?  62mof/u for HTN.   ? ? ?HPI ?PSAKEENAH VALCARCELpresents for follow up on hypertension.   ?She is on  Losartan 50 mg po qd at last visit she had HCTZ 12.5 mg added to blood pressure regimen.  ?Denies any chest pain or any cardiac symptoms.  ?She feels the HCTZ is working well. Denies any shortness breath. Denies edema.  ? ?She is not taking her metformin currently. She is not taking Lipitor currently , she stopped them. No reason just did not want to take, denies any adverse effects with medications.  ?Patient  denies any fever, body aches,chills, rash, chest pain, shortness of breath, nausea, vomiting, or diarrhea.  ?Denies dizziness, lightheadedness, pre syncopal or syncopal episodes.   ? ?Past Medical History:  ?Diagnosis Date  ? Hypertension   ? ? ?Past Surgical History:  ?Procedure Laterality Date  ? [place] ? ? ?Family History  ?Problem Relation Age of Onset  ? Cancer Mother   ? Diabetes Mother   ? Hypertension Mother   ? Other Father   ?     unknown medical history  ? ? ?Social History  ? ?Socioeconomic History  ? Marital status: Widowed  ?  Spouse name: Not on file  ? Number of children: Not on file  ? Years of education: Not on file  ? Highest education level: Not on file  ?Occupational History  ? Not on file  ?Tobacco Use  ? Smoking status: Never  ? Smokeless tobacco: Never  ?Vaping Use  ? Vaping Use: Never used  ?Substance and Sexual Activity  ? Alcohol use: No  ? Drug use: Never  ? Sexual activity: Not Currently  ?Other Topics Concern  ? Not on file  ?Social History Narrative  ? Not on file  ? ?Social Determinants of Health  ? ?Financial Resource Strain: Not on file  ?Food Insecurity: Not on file  ?Transportation Needs: Not on file  ?Physical Activity: Not on file  ?Stress: Not on file   ?Social Connections: Not on file  ?Intimate Partner Violence: Not on file  ? ? ?Outpatient Medications Prior to Visit  ?Medication Sig Dispense Refill  ? hydrochlorothiazide (MICROZIDE) 12.5 MG capsule Take 1 capsule (12.5 mg total) by mouth daily. 90 capsule 1  ? losartan (COZAAR) 50 MG tablet Take 1 tablet (50 mg total) by mouth daily. 90 tablet 1  ? triamcinolone ointment (KENALOG) 0.5 % Apply 1 application topically 2 (two) times daily. 30 g 0  ? ?No facility-administered medications prior to visit.  ? ? ?Allergies  ?Allergen Reactions  ? Bactrim [Sulfamethoxazole-Trimethoprim]   ?  Nausea, no appitite  ? Penicillins   ?  Other reaction(s): Nausea/Vomiting ()  ? ? ?ROS ?Review of Systems  ?Constitutional: Negative.   ?HENT: Negative.    ?Respiratory: Negative.    ?Cardiovascular: Negative.   ?Gastrointestinal: Negative.   ?Genitourinary: Negative.   ?Musculoskeletal: Negative.   ?Skin: Negative.   ?Neurological: Negative.   ?Psychiatric/Behavioral: Negative.    ? ?  ?Objective:  ?  ?Physical Exam ? ? ?General: Appearance:    Obese female in no acute distress  ?Eyes:    PERRL, conjunctiva/corneas clear, EOM's intact       ?Lungs:  Clear to auscultation bilaterally, respirations unlabored  ?Heart:    Normal heart rate. Normal rhythm. No murmurs, rubs, or gallops.  ?  ?MS:   All extremities are intact.  ?  ?Neurologic:   Awake, alert, oriented x 3. No apparent focal neurological           defect.   ? Abdomen: soft and non-tender without masses, organomegaly or hernias noted.  No guarding or rebound  ?BP 124/74 (BP Location: Left Arm, Patient Position: Sitting, Cuff Size: Small)   Pulse 74   Temp 98 ?F (36.7 ?C) (Oral)   Ht 5' (1.524 m)   Wt 192 lb 12.8 oz (87.5 kg)   SpO2 99%   BMI 37.65 kg/m?  ?Wt Readings from Last 3 Encounters:  ?04/09/21 192 lb 12.8 oz (87.5 kg)  ?12/24/20 192 lb 9.6 oz (87.4 kg)  ?07/17/20 194 lb 6.4 oz (88.2 kg)  ? ? ? ?Health Maintenance Due  ?Topic Date Due  ? FOOT EXAM   04/16/2021  ? ? ?There are no preventive care reminders to display for this patient. ? ?Lab Results  ?Component Value Date  ? TSH 2.54 12/24/2020  ? ?Lab Results  ?Component Value Date  ? WBC 5.5 12/24/2020  ? HGB 13.1 12/24/2020  ? HCT 40.2 12/24/2020  ? MCV 87.2 12/24/2020  ? PLT 132.0 (L) 12/24/2020  ? ?Lab Results  ?Component Value Date  ? NA 140 12/24/2020  ? K 4.0 12/24/2020  ? CO2 29 12/24/2020  ? GLUCOSE 88 12/24/2020  ? BUN 18 12/24/2020  ? CREATININE 0.74 12/24/2020  ? BILITOT 1.0 12/24/2020  ? ALKPHOS 61 12/24/2020  ? AST 18 12/24/2020  ? ALT 14 12/24/2020  ? PROT 7.2 12/24/2020  ? ALBUMIN 4.4 12/24/2020  ? CALCIUM 9.2 12/24/2020  ? ANIONGAP 8 10/10/2012  ? EGFR 94 04/16/2020  ? GFR 82.51 12/24/2020  ? ?Lab Results  ?Component Value Date  ? CHOL 173 10/21/2020  ? ?Lab Results  ?Component Value Date  ? HDL 39 (L) 10/21/2020  ? ?Lab Results  ?Component Value Date  ? LDLCALC 115 (H) 10/21/2020  ? ?Lab Results  ?Component Value Date  ? TRIG 106 10/21/2020  ? ?Lab Results  ?Component Value Date  ? CHOLHDL 3.8 04/16/2020  ? ?Lab Results  ?Component Value Date  ? HGBA1C 6.2 12/24/2020  ? ? ?  ?Assessment & Plan:  ? ?Problem List Items Addressed This Visit   ? ?  ? Cardiovascular and Mediastinum  ? Hypertension  ? Relevant Medications  ? losartan (COZAAR) 50 MG tablet  ? Other Relevant Orders  ? Comprehensive metabolic panel  ?  ? Endocrine  ? Type 2 diabetes mellitus without complication, without long-term current use of insulin ([place])  ? Relevant Medications  ? losartan (COZAAR) 50 MG tablet  ? Other Relevant Orders  ? Hemoglobin A1c  ? Comprehensive metabolic panel  ?  ? Other  ? Body mass index (BMI) of 37.0-37.9 in adult - Primary  ? Relevant Orders  ? Hemoglobin A1c  ? Comprehensive metabolic panel  ? ?Meds ordered this encounter  ?Medications  ? losartan (COZAAR) 50 MG tablet  ?  Sig: Take 1 tablet (50 mg total) by mouth daily.  ?  Dispense:  90 tablet  ?  Refill:  2  ?  ? ?She prefers to try diet and  exercise for her cholesterol and hemoglobins A1C at this time. Discussed risks versus benefits. Patient verbalized understanding of all instructions given and denies  any further questions at this time.  ? ?Meds ordered this encounter  ?Medications  ? losartan (COZAAR) 50 MG tablet  ?  Sig: Take 1 tablet (50 mg total) by mouth daily.  ?  Dispense:  90 tablet  ?  Refill:  2  ? ? ?Orders Placed This Encounter  ?Procedures  ? Hemoglobin A1c  ?  Standing Status:   Future  ?  Standing Expiration Date:   10/10/2021  ? Comprehensive metabolic panel  ?  Standing Status:   Future  ?  Standing Expiration Date:   10/10/2021  ?  ?The patient is advised to begin progressive daily aerobic exercise program, follow a low fat, low cholesterol diet, attempt to lose weight, reduce salt in diet and cooking, improve dietary compliance, use calcium 1 gram daily with Vit D, continue current medications, continue current healthy lifestyle patterns, and return for routine annual checkups.  ?Red Flags discussed. The patient was given clear instructions to go to ER or return to medical center if any red flags develop, symptoms do not improve, worsen or new problems develop. They verbalized understanding. ? ?Patient is aware that his primary care provider is leaving the office and last day will be May 01, 2021 and he will need to establish care with a new primary care provider, list is available upfront for patient to help him with establishing care or they can choose a provider of their choice.Patient verbalized understanding of all instructions given and denies any further questions at this time.   ?Follow-up: Return in about 3 months (around 07/10/2021), or if symptoms worsen or fail to improve, for at any time for any worsening symptoms, Go to Emergency room/ urgent care if worse.  ? ? ?Marcille Buffy, FNP ? ?

## 2021-04-16 ENCOUNTER — Encounter: Payer: Self-pay | Admitting: Adult Health

## 2021-06-16 ENCOUNTER — Encounter: Payer: Self-pay | Admitting: Family Medicine

## 2021-06-16 ENCOUNTER — Ambulatory Visit (INDEPENDENT_AMBULATORY_CARE_PROVIDER_SITE_OTHER): Payer: Medicare Other | Admitting: Family Medicine

## 2021-06-16 DIAGNOSIS — E785 Hyperlipidemia, unspecified: Secondary | ICD-10-CM

## 2021-06-16 DIAGNOSIS — I10 Essential (primary) hypertension: Secondary | ICD-10-CM

## 2021-06-16 DIAGNOSIS — Z6837 Body mass index (BMI) 37.0-37.9, adult: Secondary | ICD-10-CM | POA: Diagnosis not present

## 2021-06-16 DIAGNOSIS — R7303 Prediabetes: Secondary | ICD-10-CM

## 2021-06-16 LAB — COMPREHENSIVE METABOLIC PANEL
ALT: 13 U/L (ref 0–35)
AST: 18 U/L (ref 0–37)
Albumin: 4.2 g/dL (ref 3.5–5.2)
Alkaline Phosphatase: 54 U/L (ref 39–117)
BUN: 13 mg/dL (ref 6–23)
CO2: 27 mEq/L (ref 19–32)
Calcium: 9 mg/dL (ref 8.4–10.5)
Chloride: 103 mEq/L (ref 96–112)
Creatinine, Ser: 0.58 mg/dL (ref 0.40–1.20)
GFR: 91.98 mL/min (ref >60.00)
Glucose, Bld: 80 mg/dL (ref 70–99)
Potassium: 3.7 mEq/L (ref 3.5–5.1)
Sodium: 138 mEq/L (ref 135–145)
Total Bilirubin: 0.9 mg/dL (ref 0.2–1.2)
Total Protein: 7.2 g/dL (ref 6.0–8.3)

## 2021-06-16 LAB — LDL CHOLESTEROL, DIRECT: Direct LDL: 126 mg/dL

## 2021-06-16 LAB — HEMOGLOBIN A1C: Hgb A1c MFr Bld: 5.9 % (ref 4.6–6.5)

## 2021-06-16 NOTE — Assessment & Plan Note (Signed)
A little elevated today though prior check was acceptable.  She will take the HCTZ 12.5 mg daily and continue on losartan 50 mg daily.  We will have her back in about 3 months to recheck her blood pressure. ?

## 2021-06-16 NOTE — Assessment & Plan Note (Signed)
Encouraged adding in some exercise and working on dietary changes. ?

## 2021-06-16 NOTE — Assessment & Plan Note (Signed)
I encouraged adding in walking for exercise a few days a week.  Discussed reduction of sugary drink intake.  Advised increasing vegetable intake. ?

## 2021-06-16 NOTE — Assessment & Plan Note (Signed)
Check LDL. 

## 2021-06-16 NOTE — Progress Notes (Signed)
?Tommi Rumps, MD ?Phone: [phone] ? ?Erika Perry is a 71 y.o. female who presents today for f/u. ? ?HYPERTENSION ?Disease Monitoring ?Home BP Monitoring not checking Chest pain- no    Dyspnea- no ?Medications ?Compliance-  taking HCTZ (prn reports she is taking this about 5 days a week) and losartan.  Edema- no ?BMET ?   ?Component Value Date/Time  ? NA 140 12/24/2020 1352  ? NA 142 04/16/2020 0951  ? NA 141 10/10/2012 1749  ? K 4.0 12/24/2020 1352  ? K 3.5 10/10/2012 1749  ? CL 104 12/24/2020 1352  ? CL 107 10/10/2012 1749  ? CO2 29 12/24/2020 1352  ? CO2 26 10/10/2012 1749  ? GLUCOSE 88 12/24/2020 1352  ? GLUCOSE 82 10/10/2012 1749  ? BUN 18 12/24/2020 1352  ? BUN 11 04/16/2020 0951  ? BUN 12 10/10/2012 1749  ? CREATININE 0.74 12/24/2020 1352  ? CREATININE 0.55 (L) 10/10/2012 1749  ? CALCIUM 9.2 12/24/2020 1352  ? CALCIUM 8.9 10/10/2012 1749  ? GFRNONAA >60 10/10/2012 1749  ? GFRAA >60 10/10/2012 1749  ? ?Prediabetes: Patient notes no polyuria or polydipsia.  She is active on a day-to-day basis though no specific exercise.  Typically has cereal for breakfast.  She will have a sandwich and chips for lunch.  Occasionally she will have a chicken biscuit.  She will have stewed beef and potatoes for something similar for dinner.  She does not have vegetables every day.  She does have fruit most days.  She has 2 glasses of sweet tea daily.  She rarely has a soda.  She she does not eat many sweets.  She has fried foods 1-2 times a week. ? ?Social History  ? ?Tobacco Use  ?Smoking Status Never  ?Smokeless Tobacco Never  ? ? ?Current Outpatient Medications on File Prior to Visit  ?Medication Sig Dispense Refill  ? hydrochlorothiazide (MICROZIDE) 12.5 MG capsule Take 1 capsule (12.5 mg total) by mouth daily. 90 capsule 1  ? losartan (COZAAR) 50 MG tablet Take 1 tablet (50 mg total) by mouth daily. 90 tablet 2  ? [DISCONTINUED] atorvastatin (LIPITOR) 10 MG tablet Take 1 tablet (10 mg total) by mouth daily. 90  tablet 1  ? [DISCONTINUED] metFORMIN (GLUCOPHAGE) 500 MG tablet Take 1 tablet (500 mg total) by mouth 2 (two) times daily with a meal. 180 tablet 1  ? ?No current facility-administered medications on file prior to visit.  ? ? ? ?ROS see history of present illness ? ?Objective ? ?Physical Exam ?Vitals:  ? 06/16/21 1132 06/16/21 1145  ?BP: (!) 150/80 (!) 150/70  ?Pulse: 86   ?Temp: 98.7 ?F (37.1 ?C)   ?SpO2: 98%   ? ? ?BP Readings from Last 3 Encounters:  ?06/16/21 (!) 150/70  ?04/09/21 124/74  ?12/24/20 (!) 142/88  ? ?Wt Readings from Last 3 Encounters:  ?06/16/21 192 lb 6.4 oz (87.3 kg)  ?04/09/21 192 lb 12.8 oz (87.5 kg)  ?12/24/20 192 lb 9.6 oz (87.4 kg)  ? ? ?Physical Exam ?Constitutional:   ?   General: She is not in acute distress. ?   Appearance: She is not diaphoretic.  ?Cardiovascular:  ?   Rate and Rhythm: Normal rate and regular rhythm.  ?   Heart sounds: Normal heart sounds.  ?Pulmonary:  ?   Effort: Pulmonary effort is normal.  ?   Breath sounds: Normal breath sounds.  ?Musculoskeletal:  ?   Right lower leg: No edema.  ?   Left lower leg: No  edema.  ?Skin: ?   General: Skin is warm and dry.  ?Neurological:  ?   Mental Status: She is alert.  ? ? ? ?Assessment/Plan: Please see individual problem list. ? ?Problem List Items Addressed This Visit   ? ? Body mass index (BMI) of 37.0-37.9 in adult (Chronic)  ?  Encouraged adding in some exercise and working on dietary changes. ? ?  ?  ? Hyperlipidemia (Chronic)  ?  Check LDL. ? ?  ?  ? Relevant Orders  ? Comp Met (CMET)  ? Direct LDL  ? Hypertension (Chronic)  ?  A little elevated today though prior check was acceptable.  She will take the HCTZ 12.5 mg daily and continue on losartan 50 mg daily.  We will have her back in about 3 months to recheck her blood pressure. ? ?  ?  ? Relevant Orders  ? Comp Met (CMET)  ? Prediabetes (Chronic)  ?  I encouraged adding in walking for exercise a few days a week.  Discussed reduction of sugary drink intake.  Advised  increasing vegetable intake. ? ?  ?  ? Relevant Orders  ? HgB A1c  ? ? ? ?Return in about 3 months (around 09/16/2021) for Blood pressure follow-up. ? ? ?Tommi Rumps, MD ?Painter ? ?

## 2021-06-16 NOTE — Patient Instructions (Signed)
Nice to see you. ?We will get lab work today and contact you with results. ?Please take the hydrochlorothiazide consistently. ? ?

## 2021-06-19 ENCOUNTER — Other Ambulatory Visit: Payer: Self-pay

## 2021-06-19 DIAGNOSIS — E785 Hyperlipidemia, unspecified: Secondary | ICD-10-CM

## 2021-06-19 MED ORDER — ROSUVASTATIN CALCIUM 20 MG PO TABS: 20.0000 mg | ORAL_TABLET | Freq: Every day | ORAL | 1 refills | Status: DC

## 2021-07-01 ENCOUNTER — Telehealth: Payer: Self-pay | Admitting: Family Medicine

## 2021-07-01 NOTE — Telephone Encounter (Signed)
Pt called in stating that she had broken her arm on mothers day Jun 22, 2021... Pt stated that she is on workmen's compensation... Pt stated that she just wanted Dr. Birdie Sons to be aware.Marland KitchenMarland Kitchen

## 2021-07-02 NOTE — Telephone Encounter (Signed)
Noted. I will let workman's comp handle this.

## 2021-07-04 DIAGNOSIS — M25521 Pain in right elbow: Secondary | ICD-10-CM | POA: Insufficient documentation

## 2021-07-08 ENCOUNTER — Ambulatory Visit: Payer: Self-pay | Admitting: Student

## 2021-07-09 ENCOUNTER — Encounter (HOSPITAL_COMMUNITY): Payer: Self-pay | Admitting: Student

## 2021-07-10 ENCOUNTER — Encounter (HOSPITAL_COMMUNITY): Payer: Self-pay | Admitting: Student

## 2021-07-10 ENCOUNTER — Other Ambulatory Visit: Payer: Self-pay

## 2021-07-10 NOTE — Progress Notes (Addendum)
Ms Bubar denies chest pain or shortness of breath. Patient denies having any s/s of Covid in her household.  Patient denies any known exposure to Covid.   Ms Rule's PCP is Dr. Doree Fudge.  Ms Foody has prediabetes, last A1C was drawn on 5/8/223, it was 5.4.  Patient does not have a CBG monitor.  Ms Arrighi has a prescription for Narcan, patient said she has it because she does not take many medications, has Nacan available.

## 2021-07-10 NOTE — H&P (Incomplete)
[facility] ([facility]) H&P  Patient ID: Erika Perry MRN: [id number] DOB/AGE: 06/24/1950 71 y.o.  Reason for surgery: Right distal humerus fracture  HPI: Erika Perry is an 71 y.o. female with PMH significant for prediabetes (last Hgb A1c 5.4 on 06/16/2021) and hypertension presenting for surgical fixation of right distal humerus fracture.  Patient sustained a fall while at work on [employer], landing on her right elbow.  Was seen at urgent care and found to have right intra-articular distal humerus fracture.  Patient was placed in a long-arm splint and referred to [facility] for evaluation and management. She presents now for surgical fixation of her fracture. Pain has been manageable.  Denies any significant numbness or tingling to the right upper extremity.  No other injuries from her fall.  No previous injury or surgery to the right upper extremity. Patient is *** hand dominant   Past Medical History:  Diagnosis Date   HLD (hyperlipidemia)    Hypertension    Pre-diabetes     Past Surgical History:  Procedure Laterality Date   CESAREAN SECTION  1981, 38    Family History  Problem Relation Age of Onset   Cancer Mother    Diabetes Mother    Hypertension Mother    Other Father        unknown medical history    Social History:  reports that she has never smoked. She has never used smokeless tobacco. She reports that she does not drink alcohol and does not use drugs.  Allergies:  Allergies  Allergen Reactions   Bactrim [Sulfamethoxazole-Trimethoprim]     Nausea, no appitite   Penicillins     Other reaction(s): Nausea/Vomiting ()    Medications: I have reviewed the patient's current medications. Prior to Admission:  No medications prior to admission.    ROS: Constitutional: No fever or chills Vision: No changes in vision ENT: No difficulty swallowing CV: No chest pain Pulm: No SOB or wheezing GI: No nausea or vomiting GU: No  urgency or inability to hold urine Skin: No poor wound healing Neurologic: No numbness or tingling Psychiatric: No depression or anxiety Heme: No bruising Allergic: No reaction to medications or food   Exam: Height 5\' 2"  (1.575 m), weight 83.9 kg. General: No acute distress Orientation: Alert and oriented x3 Mood and Affect: Mood and affect appropriate, pleasant and cooperative Gait: Within normal limits Coordination and balance: Within normal limits  Right upper extremity: Long-arm splint in place, did not fully remove for exam.  Swelling present but appropriate.  Compartments compressible.  Able to wiggle fingers.  Nontender above splint.  Hand warm and well-perfused  Left upper extremity: Skin without lesions. No tenderness to palpation. Full painless ROM, full strength in each muscle groups without evidence of instability.   Medical Decision Making: Data: Imaging: CT scan right elbow shows fracture through the distal humerus with intra-articular extension.  No obvious dislocation noted.  Labs: No results found for this or any previous visit (from the past 168 hour(s)).   Medical history and chart was reviewed and case discussed with medical provider.  Assessment/Plan: 71 year old female with right distal humerus fracture  Patient with significant injury to the right upper extremity which require surgical intervention.  Recommend proceeding with open reduction internal fixation of the right distal humerus.  Risks and benefits of the procedure were discussed with the patient.   Risks discussed included bleeding, infection, malunion, nonunion, damage to surrounding nerves and blood vessels, pain,  hardware prominence or irritation, hardware failure, elbow stiffness, post-traumatic arthritis, DVT/PE, compartment syndrome, and even anesthesia complications.  Patient states understanding of these risk.  She agrees to proceed with surgery.  Consent will be obtained.  We will plan to  admit the patient overnight for pain control and therapies.   Thompson Caul PA-C [facility] [phone] (office) [email or web address]

## 2021-07-10 NOTE — Anesthesia Preprocedure Evaluation (Signed)
Anesthesia Evaluation  Patient identified by MRN, date of birth, ID band Patient awake    Reviewed: Allergy & Precautions, NPO status , Patient's Chart, lab work & pertinent test results  Airway Mallampati: II  TM Distance: >3 FB Neck ROM: Full    Dental  (+) Edentulous Upper, Edentulous Lower   Pulmonary neg pulmonary ROS,    Pulmonary exam normal breath sounds clear to auscultation       Cardiovascular hypertension, Pt. on medications + Peripheral Vascular Disease  Normal cardiovascular exam Rhythm:Regular Rate:Normal     Neuro/Psych negative neurological ROS  negative psych ROS   GI/Hepatic negative GI ROS, Neg liver ROS,   Endo/Other  Obesity Hyperlipidemia Pre diabetes  Renal/GU negative Renal ROS  negative genitourinary   Musculoskeletal Right distal humerus Fx   Abdominal (+) + obese,   Peds  Hematology Thrombocytopenia-mild   Anesthesia Other Findings   Reproductive/Obstetrics                           Anesthesia Physical Anesthesia Plan  ASA: 2  Anesthesia Plan: General   Post-op Pain Management: Regional block* and Minimal or no pain anticipated   Induction: Intravenous  PONV Risk Score and Plan: 4 or greater and Treatment may vary due to age or medical condition, Ondansetron and Dexamethasone  Airway Management Planned: LMA  Additional Equipment: None  Intra-op Plan:   Post-operative Plan: Extubation in OR  Informed Consent: I have reviewed the patients History and Physical, chart, labs and discussed the procedure including the risks, benefits and alternatives for the proposed anesthesia with the patient or authorized representative who has indicated his/her understanding and acceptance.     Dental advisory given  Plan Discussed with: CRNA and Anesthesiologist  Anesthesia Plan Comments:        Anesthesia Quick Evaluation

## 2021-07-11 ENCOUNTER — Ambulatory Visit (HOSPITAL_COMMUNITY): Payer: Worker's Compensation

## 2021-07-11 ENCOUNTER — Ambulatory Visit (HOSPITAL_BASED_OUTPATIENT_CLINIC_OR_DEPARTMENT_OTHER): Payer: Worker's Compensation | Admitting: Anesthesiology

## 2021-07-11 ENCOUNTER — Observation Stay (HOSPITAL_COMMUNITY): Payer: Worker's Compensation

## 2021-07-11 ENCOUNTER — Encounter (HOSPITAL_COMMUNITY)
Admission: RE | Disposition: A | Discharge status: Home / Self Care | Payer: Self-pay | Source: Home / Self Care | Attending: Student

## 2021-07-11 ENCOUNTER — Other Ambulatory Visit: Payer: Self-pay

## 2021-07-11 ENCOUNTER — Observation Stay (HOSPITAL_COMMUNITY)
Admission: RE | Admit: 2021-07-11 | Discharge: 2021-07-13 | Disposition: A | Discharge status: Home / Self Care | Payer: Worker's Compensation | Attending: Student | Admitting: Student

## 2021-07-11 ENCOUNTER — Ambulatory Visit (HOSPITAL_COMMUNITY): Payer: Worker's Compensation | Admitting: Anesthesiology

## 2021-07-11 ENCOUNTER — Encounter (HOSPITAL_COMMUNITY): Payer: Self-pay | Admitting: Student

## 2021-07-11 DIAGNOSIS — S42401A Unspecified fracture of lower end of right humerus, initial encounter for closed fracture: Secondary | ICD-10-CM | POA: Diagnosis present

## 2021-07-11 DIAGNOSIS — S72461A Displaced supracondylar fracture with intracondylar extension of lower end of right femur, initial encounter for closed fracture: Secondary | ICD-10-CM | POA: Diagnosis present

## 2021-07-11 DIAGNOSIS — Z6835 Body mass index (BMI) 35.0-35.9, adult: Secondary | ICD-10-CM

## 2021-07-11 DIAGNOSIS — E559 Vitamin D deficiency, unspecified: Secondary | ICD-10-CM | POA: Diagnosis present

## 2021-07-11 DIAGNOSIS — I10 Essential (primary) hypertension: Secondary | ICD-10-CM | POA: Insufficient documentation

## 2021-07-11 DIAGNOSIS — S42491A Other displaced fracture of lower end of right humerus, initial encounter for closed fracture: Secondary | ICD-10-CM | POA: Diagnosis not present

## 2021-07-11 DIAGNOSIS — W1830XA Fall on same level, unspecified, initial encounter: Secondary | ICD-10-CM | POA: Insufficient documentation

## 2021-07-11 DIAGNOSIS — Z91040 Latex allergy status: Secondary | ICD-10-CM | POA: Insufficient documentation

## 2021-07-11 DIAGNOSIS — S52021A Displaced fracture of olecranon process without intraarticular extension of right ulna, initial encounter for closed fracture: Secondary | ICD-10-CM | POA: Diagnosis not present

## 2021-07-11 HISTORY — DX: Prediabetes: R73.03

## 2021-07-11 HISTORY — PX: ORIF HUMERUS FRACTURE: SHX2126

## 2021-07-11 HISTORY — DX: Hyperlipidemia, unspecified: E78.5

## 2021-07-11 LAB — CBC
HCT: 37.9 % (ref 36.0–46.0)
Hemoglobin: 11.9 g/dL — ABNORMAL LOW (ref 12.0–15.0)
MCH: 28.4 pg (ref 26.0–34.0)
MCHC: 31.4 g/dL (ref 30.0–36.0)
MCV: 90.5 fL (ref 80.0–100.0)
Platelets: 152 10*3/uL (ref 150–400)
RBC: 4.19 MIL/uL (ref 3.87–5.11)
RDW: 14.9 % (ref 11.5–15.5)
WBC: 5.2 10*3/uL (ref 4.0–10.5)
nRBC: 0 % (ref 0.0–0.2)

## 2021-07-11 SURGERY — OPEN REDUCTION INTERNAL FIXATION (ORIF) DISTAL HUMERUS FRACTURE
Anesthesia: General | Site: Arm Upper | Laterality: Right

## 2021-07-11 MED ORDER — LACTATED RINGERS IV SOLN: INTRAVENOUS | Status: DC

## 2021-07-11 MED ORDER — OXYCODONE HCL 5 MG PO TABS: 5.0000 mg | ORAL_TABLET | Freq: Once | ORAL | Status: DC | PRN

## 2021-07-11 MED ORDER — ONDANSETRON HCL 4 MG PO TABS: 4.0000 mg | ORAL_TABLET | Freq: Four times a day (QID) | ORAL | Status: DC | PRN

## 2021-07-11 MED ORDER — ASPIRIN 81 MG PO TBEC
81.0000 mg | DELAYED_RELEASE_TABLET | Freq: Every day | ORAL | Status: DC
  Administered 2021-07-12 – 2021-07-13 (×2): 81 mg via ORAL
  Filled 2021-07-11 (×2): qty 1

## 2021-07-11 MED ORDER — HYDROMORPHONE HCL 1 MG/ML IJ SOLN
0.5000 mg | INTRAMUSCULAR | Status: DC | PRN
  Administered 2021-07-11 – 2021-07-13 (×2): 1 mg via INTRAVENOUS
  Filled 2021-07-11 (×2): qty 1

## 2021-07-11 MED ORDER — PROPOFOL 10 MG/ML IV BOLUS
INTRAVENOUS | Status: DC | PRN
  Administered 2021-07-11: 60 mg via INTRAVENOUS
  Administered 2021-07-11: 140 mg via INTRAVENOUS

## 2021-07-11 MED ORDER — ROSUVASTATIN CALCIUM 20 MG PO TABS
20.0000 mg | ORAL_TABLET | Freq: Every day | ORAL | Status: DC
  Administered 2021-07-11 – 2021-07-13 (×3): 20 mg via ORAL
  Filled 2021-07-11 (×3): qty 1

## 2021-07-11 MED ORDER — FENTANYL CITRATE (PF) 250 MCG/5ML IJ SOLN
INTRAMUSCULAR | Status: DC | PRN
  Administered 2021-07-11 (×2): 50 ug via INTRAVENOUS

## 2021-07-11 MED ORDER — METOCLOPRAMIDE HCL 5 MG PO TABS: 5.0000 mg | ORAL_TABLET | Freq: Three times a day (TID) | ORAL | Status: DC | PRN

## 2021-07-11 MED ORDER — HYDROCHLOROTHIAZIDE 12.5 MG PO TABS
12.5000 mg | ORAL_TABLET | Freq: Every day | ORAL | Status: DC
  Administered 2021-07-11 – 2021-07-13 (×3): 12.5 mg via ORAL
  Filled 2021-07-11 (×3): qty 1

## 2021-07-11 MED ORDER — PHENYLEPHRINE HCL-NACL 20-0.9 MG/250ML-% IV SOLN
INTRAVENOUS | Status: DC | PRN
  Administered 2021-07-11: 25 ug/min via INTRAVENOUS

## 2021-07-11 MED ORDER — CEFAZOLIN SODIUM-DEXTROSE 2-4 GM/100ML-% IV SOLN
2.0000 g | INTRAVENOUS | Status: AC
  Administered 2021-07-11: 2 g via INTRAVENOUS
  Filled 2021-07-11: qty 100

## 2021-07-11 MED ORDER — ASPIRIN 81 MG PO TBEC: 81.0000 mg | DELAYED_RELEASE_TABLET | Freq: Every day | ORAL | 12 refills | Status: DC

## 2021-07-11 MED ORDER — CHLORHEXIDINE GLUCONATE 0.12 % MT SOLN
15.0000 mL | Freq: Once | OROMUCOSAL | Status: AC
  Administered 2021-07-11: 15 mL via OROMUCOSAL
  Filled 2021-07-11: qty 15

## 2021-07-11 MED ORDER — ASPIRIN 81 MG PO TBEC: 81.0000 mg | DELAYED_RELEASE_TABLET | Freq: Every day | ORAL | 0 refills | Status: AC

## 2021-07-11 MED ORDER — OXYCODONE HCL 5 MG PO TABS
5.0000 mg | ORAL_TABLET | ORAL | Status: DC | PRN
  Administered 2021-07-13: 10 mg via ORAL
  Filled 2021-07-11: qty 2

## 2021-07-11 MED ORDER — VANCOMYCIN HCL 1000 MG IV SOLR
INTRAVENOUS | Status: AC
  Filled 2021-07-11: qty 20

## 2021-07-11 MED ORDER — MIDAZOLAM HCL 2 MG/2ML IJ SOLN
INTRAMUSCULAR | Status: AC
  Filled 2021-07-11: qty 2

## 2021-07-11 MED ORDER — POLYETHYLENE GLYCOL 3350 17 G PO PACK: 17.0000 g | PACK | Freq: Every day | ORAL | Status: DC | PRN

## 2021-07-11 MED ORDER — DEXAMETHASONE SODIUM PHOSPHATE 10 MG/ML IJ SOLN
INTRAMUSCULAR | Status: DC | PRN
  Administered 2021-07-11: 10 mg via INTRAVENOUS

## 2021-07-11 MED ORDER — FENTANYL CITRATE (PF) 100 MCG/2ML IJ SOLN
25.0000 ug | INTRAMUSCULAR | Status: DC | PRN
  Administered 2021-07-11: 25 ug via INTRAVENOUS

## 2021-07-11 MED ORDER — ACETAMINOPHEN 500 MG PO TABS
1000.0000 mg | ORAL_TABLET | Freq: Four times a day (QID) | ORAL | Status: DC
  Administered 2021-07-11 – 2021-07-13 (×8): 1000 mg via ORAL
  Filled 2021-07-11 (×8): qty 2

## 2021-07-11 MED ORDER — METHOCARBAMOL 1000 MG/10ML IJ SOLN
500.0000 mg | Freq: Four times a day (QID) | INTRAVENOUS | Status: DC | PRN
  Filled 2021-07-11: qty 5

## 2021-07-11 MED ORDER — FENTANYL CITRATE (PF) 250 MCG/5ML IJ SOLN
INTRAMUSCULAR | Status: AC
  Filled 2021-07-11: qty 5

## 2021-07-11 MED ORDER — BUPIVACAINE HCL (PF) 0.5 % IJ SOLN
INTRAMUSCULAR | Status: DC | PRN
  Administered 2021-07-11: 15 mL via PERINEURAL

## 2021-07-11 MED ORDER — BUPIVACAINE LIPOSOME 1.3 % IJ SUSP
INTRAMUSCULAR | Status: DC | PRN
  Administered 2021-07-11: 10 mL via PERINEURAL

## 2021-07-11 MED ORDER — ONDANSETRON HCL 4 MG/2ML IJ SOLN: 4.0000 mg | Freq: Once | INTRAMUSCULAR | Status: DC | PRN

## 2021-07-11 MED ORDER — HYDRALAZINE HCL 10 MG PO TABS: 10.0000 mg | ORAL_TABLET | Freq: Four times a day (QID) | ORAL | Status: DC | PRN

## 2021-07-11 MED ORDER — LOSARTAN POTASSIUM 50 MG PO TABS
50.0000 mg | ORAL_TABLET | Freq: Every day | ORAL | Status: DC
  Administered 2021-07-11 – 2021-07-13 (×3): 50 mg via ORAL
  Filled 2021-07-11 (×3): qty 1

## 2021-07-11 MED ORDER — CEFAZOLIN SODIUM-DEXTROSE 2-4 GM/100ML-% IV SOLN
2.0000 g | Freq: Three times a day (TID) | INTRAVENOUS | Status: AC
  Administered 2021-07-11 – 2021-07-12 (×3): 2 g via INTRAVENOUS
  Filled 2021-07-11 (×3): qty 100

## 2021-07-11 MED ORDER — 0.9 % SODIUM CHLORIDE (POUR BTL) OPTIME
TOPICAL | Status: DC | PRN
  Administered 2021-07-11 (×2): 1000 mL

## 2021-07-11 MED ORDER — ORAL CARE MOUTH RINSE: 15.0000 mL | Freq: Once | OROMUCOSAL | Status: AC

## 2021-07-11 MED ORDER — ACETAMINOPHEN 325 MG PO TABS: 325.0000 mg | ORAL_TABLET | Freq: Four times a day (QID) | ORAL | Status: DC | PRN

## 2021-07-11 MED ORDER — ONDANSETRON HCL 4 MG/2ML IJ SOLN: 4.0000 mg | Freq: Four times a day (QID) | INTRAMUSCULAR | Status: DC | PRN

## 2021-07-11 MED ORDER — OXYCODONE HCL 5 MG/5ML PO SOLN: 5.0000 mg | Freq: Once | ORAL | Status: DC | PRN

## 2021-07-11 MED ORDER — SODIUM CHLORIDE 0.9 % IV SOLN: INTRAVENOUS | Status: DC

## 2021-07-11 MED ORDER — FENTANYL CITRATE (PF) 100 MCG/2ML IJ SOLN
INTRAMUSCULAR | Status: AC
  Filled 2021-07-11: qty 2

## 2021-07-11 MED ORDER — DOCUSATE SODIUM 100 MG PO CAPS
100.0000 mg | ORAL_CAPSULE | Freq: Two times a day (BID) | ORAL | Status: DC
  Administered 2021-07-11 – 2021-07-13 (×5): 100 mg via ORAL
  Filled 2021-07-11 (×5): qty 1

## 2021-07-11 MED ORDER — ONDANSETRON HCL 4 MG/2ML IJ SOLN
INTRAMUSCULAR | Status: DC | PRN
  Administered 2021-07-11: 4 mg via INTRAVENOUS

## 2021-07-11 MED ORDER — LIDOCAINE 2% (20 MG/ML) 5 ML SYRINGE
INTRAMUSCULAR | Status: DC | PRN
  Administered 2021-07-11: 60 mg via INTRAVENOUS

## 2021-07-11 MED ORDER — METOCLOPRAMIDE HCL 5 MG/ML IJ SOLN: 5.0000 mg | Freq: Three times a day (TID) | INTRAMUSCULAR | Status: DC | PRN

## 2021-07-11 MED ORDER — VANCOMYCIN HCL 1000 MG IV SOLR
INTRAVENOUS | Status: DC | PRN
  Administered 2021-07-11: 1000 mg via TOPICAL

## 2021-07-11 MED ORDER — MIDAZOLAM HCL 2 MG/2ML IJ SOLN
INTRAMUSCULAR | Status: DC | PRN
  Administered 2021-07-11: 2 mg via INTRAVENOUS

## 2021-07-11 MED ORDER — METHOCARBAMOL 500 MG PO TABS: 500.0000 mg | ORAL_TABLET | Freq: Four times a day (QID) | ORAL | Status: DC | PRN

## 2021-07-11 SURGICAL SUPPLY — 94 items
BAG COUNTER SPONGE SURGICOUNT (BAG) ×2 IMPLANT
BIT DRILL 3.2 QUICK MINI 300 (DRILL) ×1 IMPLANT
BIT DRILL 5.0 QC 6.5 (BIT) ×1 IMPLANT
BIT DRILL QC 2.0 SHORT EVOS SM (DRILL) IMPLANT
BIT DRILL QC 2.5MM SHRT EVO SM (DRILL) IMPLANT
BLADE AVERAGE 25X9 (BLADE) IMPLANT
BNDG COHESIVE 4X5 TAN STRL (GAUZE/BANDAGES/DRESSINGS) ×2 IMPLANT
BNDG ELASTIC 3X5.8 VLCR STR LF (GAUZE/BANDAGES/DRESSINGS) ×1 IMPLANT
BNDG ELASTIC 4X5.8 VLCR STR LF (GAUZE/BANDAGES/DRESSINGS) ×1 IMPLANT
BNDG ESMARK 4X9 LF (GAUZE/BANDAGES/DRESSINGS) IMPLANT
BNDG GAUZE ELAST 4 BULKY (GAUZE/BANDAGES/DRESSINGS) ×4 IMPLANT
BRUSH SCRUB EZ PLAIN DRY (MISCELLANEOUS) ×4 IMPLANT
CLEANER TIP ELECTROSURG 2X2 (MISCELLANEOUS) ×2 IMPLANT
CORD BIPOLAR FORCEPS 12FT (ELECTRODE) ×2 IMPLANT
COVER SURGICAL LIGHT HANDLE (MISCELLANEOUS) ×2 IMPLANT
DRAIN PENROSE 1/4X12 LTX STRL (WOUND CARE) IMPLANT
DRAPE C-ARM 42X72 X-RAY (DRAPES) ×2 IMPLANT
DRAPE C-ARMOR (DRAPES) IMPLANT
DRAPE HALF SHEET 40X57 (DRAPES) ×2 IMPLANT
DRAPE INCISE IOBAN 66X45 STRL (DRAPES) IMPLANT
DRAPE ORTHO SPLIT 77X108 STRL (DRAPES) ×1
DRAPE SURG ORHT 6 SPLT 77X108 (DRAPES) ×1 IMPLANT
DRAPE U-SHAPE 47X51 STRL (DRAPES) ×2 IMPLANT
DRILL QC 2.0 SHORT EVOS SM (DRILL) ×4
DRILL QC 2.5MM SHORT EVOS SM (DRILL) ×2
DRSG ADAPTIC 3X8 NADH LF (GAUZE/BANDAGES/DRESSINGS) ×2 IMPLANT
DRSG MEPILEX BORDER 4X12 (GAUZE/BANDAGES/DRESSINGS) ×1 IMPLANT
DRSG PAD ABDOMINAL 8X10 ST (GAUZE/BANDAGES/DRESSINGS) ×2 IMPLANT
ELECT REM PT RETURN 9FT ADLT (ELECTROSURGICAL) ×2
ELECTRODE REM PT RTRN 9FT ADLT (ELECTROSURGICAL) ×1 IMPLANT
EVACUATOR 1/8 PVC DRAIN (DRAIN) IMPLANT
GAUZE SPONGE 4X4 12PLY STRL (GAUZE/BANDAGES/DRESSINGS) ×4 IMPLANT
GAUZE SPONGE 4X4 12PLY STRL LF (GAUZE/BANDAGES/DRESSINGS) ×1 IMPLANT
GLOVE BIO SURGEON STRL SZ 6.5 (GLOVE) ×6 IMPLANT
GLOVE BIO SURGEON STRL SZ7.5 (GLOVE) ×6 IMPLANT
GLOVE BIOGEL PI IND STRL 6.5 (GLOVE) ×1 IMPLANT
GLOVE BIOGEL PI IND STRL 7.5 (GLOVE) ×1 IMPLANT
GLOVE BIOGEL PI INDICATOR 6.5 (GLOVE) ×1
GLOVE BIOGEL PI INDICATOR 7.5 (GLOVE) ×1
GOWN STRL REUS W/ TWL LRG LVL3 (GOWN DISPOSABLE) ×3 IMPLANT
GOWN STRL REUS W/ TWL XL LVL3 (GOWN DISPOSABLE) ×1 IMPLANT
GOWN STRL REUS W/TWL LRG LVL3 (GOWN DISPOSABLE) ×3
GOWN STRL REUS W/TWL XL LVL3 (GOWN DISPOSABLE) ×1
K-WIRE 1.6 (WIRE) ×3
K-WIRE FX150X1.6XTROC PNT (WIRE) ×3
KIT BASIN OR (CUSTOM PROCEDURE TRAY) ×2 IMPLANT
KIT TURNOVER KIT B (KITS) ×2 IMPLANT
KWIRE FX150X1.6XTROC PNT (WIRE) IMPLANT
LOOP VESSEL MAXI BLUE (MISCELLANEOUS) ×2 IMPLANT
MANIFOLD NEPTUNE II (INSTRUMENTS) ×2 IMPLANT
NDL HYPO 25X1 1.5 SAFETY (NEEDLE) IMPLANT
NEEDLE HYPO 25X1 1.5 SAFETY (NEEDLE) IMPLANT
NS IRRIG 1000ML POUR BTL (IV SOLUTION) ×2 IMPLANT
PACK ORTHO EXTREMITY (CUSTOM PROCEDURE TRAY) ×2 IMPLANT
PAD ARMBOARD 7.5X6 YLW CONV (MISCELLANEOUS) ×4 IMPLANT
PAD CAST 3X4 CTTN HI CHSV (CAST SUPPLIES) IMPLANT
PAD CAST 4YDX4 CTTN HI CHSV (CAST SUPPLIES) IMPLANT
PADDING CAST COTTON 3X4 STRL (CAST SUPPLIES) ×1
PADDING CAST COTTON 4X4 STRL (CAST SUPPLIES) ×1
PLATE HUM  EVOS 2.7X80 3H (Plate) ×1 IMPLANT
PLATE HUM EVOS 2.7X80 3H (Plate) IMPLANT
PLATE HUM EVOS 6H R 2.7X85 (Plate) ×1 IMPLANT
SCREW CANN SD PT 6.5X95 (Screw) ×1 IMPLANT
SCREW CORT 2.7X15 T8 ST EVOS (Screw) ×1 IMPLANT
SCREW CORT 2.7X22 T8 ST EVOS (Screw) ×1 IMPLANT
SCREW CORT 3.5X20 ST EVOS (Screw) ×2 IMPLANT
SCREW CORT 3.5X22 ST EVOS (Screw) ×1 IMPLANT
SCREW CORT ST EVOS 2.7X46 (Screw) ×1 IMPLANT
SCREW CORT VA EVOS 2.7X30 (Screw) ×1 IMPLANT
SCREW CORTEX 3.5X18 EVOS (Screw) ×1 IMPLANT
SCREW CORTEX 3.5X24MM (Screw) ×1 IMPLANT
SCREW EVOS 2.7 X 50 LCK T8 S-T (Screw) ×1 IMPLANT
SCREW LOCK 2.7X26 (Screw) ×1 IMPLANT
SCREW LOCK ST EVOS 2.7X22 (Screw) ×2 IMPLANT
SCREW LOCK ST EVOS 2.7X24 (Screw) ×1 IMPLANT
SLING ARM FOAM STRAP LRG (SOFTGOODS) ×1 IMPLANT
SPONGE T-LAP 18X18 ~~LOC~~+RFID (SPONGE) ×2 IMPLANT
STAPLER VISISTAT 35W (STAPLE) IMPLANT
STOCKINETTE IMPERVIOUS 9X36 MD (GAUZE/BANDAGES/DRESSINGS) ×2 IMPLANT
SUCTION FRAZIER HANDLE 10FR (MISCELLANEOUS) ×1
SUCTION TUBE FRAZIER 10FR DISP (MISCELLANEOUS) ×1 IMPLANT
SUT ETHILON 3 0 PS 1 (SUTURE) ×3 IMPLANT
SUT VIC AB 0 CT1 27 (SUTURE) ×2
SUT VIC AB 0 CT1 27XBRD ANBCTR (SUTURE) ×2 IMPLANT
SUT VIC AB 2-0 CT1 27 (SUTURE) ×2
SUT VIC AB 2-0 CT1 TAPERPNT 27 (SUTURE) ×2 IMPLANT
SYR 5ML LL (SYRINGE) IMPLANT
SYR CONTROL 10ML LL (SYRINGE) ×2 IMPLANT
TOWEL GREEN STERILE (TOWEL DISPOSABLE) ×2 IMPLANT
TOWEL GREEN STERILE FF (TOWEL DISPOSABLE) ×2 IMPLANT
TRAY FOLEY MTR SLVR 16FR STAT (SET/KITS/TRAYS/PACK) IMPLANT
WASHER CANN 12.7 NS (Orthopedic Implant) ×1 IMPLANT
WATER STERILE IRR 1000ML POUR (IV SOLUTION) ×2 IMPLANT
YANKAUER SUCT BULB TIP NO VENT (SUCTIONS) IMPLANT

## 2021-07-11 NOTE — Anesthesia Procedure Notes (Signed)
Procedure Name: LMA Insertion Date/Time: 07/11/2021 7:40 AM Performed by: Darryl Nestle, CRNA Pre-anesthesia Checklist: Patient identified, Emergency Drugs available, Suction available and Patient being monitored Patient Re-evaluated:Patient Re-evaluated prior to induction Oxygen Delivery Method: Circle system utilized Preoxygenation: Pre-oxygenation with 100% oxygen Induction Type: IV induction Ventilation: Mask ventilation without difficulty LMA: LMA inserted LMA Size: 4.0 Tube type: Oral Number of attempts: 1 Placement Confirmation: positive ETCO2 and breath sounds checked- equal and bilateral Tube secured with: Tape Dental Injury: Teeth and Oropharynx as per pre-operative assessment

## 2021-07-11 NOTE — Progress Notes (Signed)
Orthopedic Tech Progress Note Patient Details:  Erika Perry 1950/12/26 [id number]  Patient ID: Erika Perry, female   DOB: 1950/11/08, 71 y.o.   MRN: [id number] No OHF; upper extremity injury. [name] 07/11/2021, 12:52 PM

## 2021-07-11 NOTE — Op Note (Signed)
Orthopaedic Surgery Operative Note (CSN: [id number] ) Date of Surgery: 07/11/2021  Admit Date: 07/11/2021   Diagnoses: Pre-Op Diagnoses: Right supracondylar humerus fracture with intracondylar extension  Post-Op Diagnosis: Right supracondylar humerus fracture with intracondylar extension  Procedures: CPT 24546-Open reduction internal fixation of right supracondylar distal humerus fracture CPT 25360-Right olecranon osteotomy  Surgeons : Primary: Roby LoftsHaddix, Austen Oyster P, MD  Assistant: Ulyses SouthwardSarah Yacobi, PA-C  Location: OR 3   Anesthesia:General with regional block   Antibiotics: Ancef 2g preop with 1 gm vancomycin powder placed topically   Tourniquet time: None used    Estimated Blood Loss: 50 mL  Complications:None   Specimens:None   Implants: Implant Name Type Inv. Item Serial No. Manufacturer Lot No. LRB No. Used Action  WASHER 6.5 - [id number] Orthopedic Implant WASHER 6.5  [employer]  Right 1 Implanted  SCREW CORT ST EVOS 2.7X46 - [id number] Screw SCREW CORT ST EVOS 2.7X46  [employer] [employer]  Right 1 Implanted  SCREW CORT 3.5X20 ST EVOS - [id number] Screw SCREW CORT 3.5X20 ST EVOS  [place] [employer]  Right 1 Implanted  PLATE HUM EVOS 6H R 2.7X85 - [id number] Plate PLATE HUM EVOS 6H R 2.7X85  [place] [employer]  Right 1 Implanted  SCREW CORTEX 3.5X18 EVOS - [id number] Screw SCREW CORTEX 3.5X18 EVOS  Paras [employer]  Right 1 Implanted  PLATE HUM  EVOS 2.7X80 3H - [id number] Plate PLATE HUM  EVOS 2.7X80 3H  Faulcon [employer]  Right 1 Implanted  SCREW CORTEX 3.5X24MM - [id number] Screw SCREW CORTEX 3.5X24MM  Huether [employer]  Right 1 Implanted  SCREW CORT 3.5X22 ST EVOS - [id number] Screw SCREW CORT 3.5X22 ST EVOS  [place] [employer]  Right 1 Implanted  SCREW CORT 2.7X22 T8 ST EVOS - [id number] Screw SCREW CORT 2.7X22 T8 ST EVOS  [place]arius [employer]  Right 1 Implanted  SCREW CORT 2.7X15 T8 ST  EVOS - [id number] Screw SCREW CORT 2.7X15 T8 ST EVOS  Rennaker [employer]  Right 1 Implanted  SCREW LOCK 2.7X26 - [id number] Screw SCREW LOCK 2.7X26  [place]orver [employer]  Right 1 Implanted  SCREW EVOS 2.7 X 50 LCK T8 S-T - [id number] Screw SCREW EVOS 2.7 X 50 LCK T8 S-T  Spargur [employer]  Right 1 Implanted  SCREW CORT VA EVOS 2.7X30 - [id number] Screw SCREW CORT VA EVOS 2.7X30  [place] [employer]  Right 1 Implanted  SCREW LOCK ST EVOS 2.7X22 - [id number] Screw SCREW LOCK ST EVOS 2.7X22  Kreider [employer]  Right 2 Implanted  SCREW LOCK ST EVOS 2.7X24 - [id number] Screw SCREW LOCK ST EVOS 2.7X24  [place] [employer]  Right 1 Implanted  6.5 X 95 PT SCREW      Right 1 Implanted     Indications for Surgery: 71 year old female who sustained a right supracondylar distal humerus fracture with intercondylar extension.  Due to the unstable nature of her injury I recommend proceeding with open reduction internal fixation.  Risks and benefits were discussed with the patient.  Risks included but not limited to bleeding, infection, malunion, nonunion, hardware failure, hardware irritation, nerve or blood vessel injury, posttraumatic arthritis, elbow stiffness, DVT, even the possibility anesthetic complications.  She agreed to proceed with surgery and consent was obtained.  Operative Findings: 1.  Open reduction internal fixation of right supracondylar distal humerus fracture using Par[employer]hew EVOS posterior lateral and direct medial distal humeral locking  plates 2.  Olecranon osteotomy to access and visualized the articular surface treated with fixation using a Shoults [employer] Nephew 6.5 mm partially threaded cannulated screw with a washer.  Procedure: The patient was identified in the preoperative holding area. Consent was confirmed with the patient and their family and all questions were answered. The operative extremity was marked after confirmation with the  patient. she was then brought back to the operating room by our anesthesia colleagues.  She was carefully transferred over to radiolucent flat top table.  She was placed under general anesthetic.  She was then positioned in the lateral decubitus position with the right side up.  An axillary roll was placed to keep pressure off of her neurovascular structures.  A beanbag was used to position her and this was deflated.  All bony prominences were well-padded.  The right upper extremity was then prepped and draped in usual sterile fashion.  A timeout was performed to verify the patient, the procedure, and the extremity.  Preoperative antibiotics were dosed.  A direct posterior approach was made and carried down through skin and subcutaneous tissue.  I exposed the triceps fascia and mobilized the skin flaps medially and laterally to identify the intermuscular septum's.  I then carefully dissected the lateral intermuscular septum.  There is significant healing and some premature heterotopic ossification that formed.  I carefully dissected through the soft tissue planes to mobilize the triceps as it inserted on the olecranon.  I then carefully dissected the ulnar nerve from the medial intermuscular septum.  I dissected it down into the cubital fossa and sacrificed the branch to the joint itself.  I carefully mobilized this and protected this throughout the case.  I then cleaned out some of the soft tissue and callus that had formed along the posterior medial aspect of the olecranon and triceps.  I was able to access the joint and visualized the articular surface of the olecranon.  Using lateral fluoroscopic imaging I then marked out the location of a osteotomy for the ulna.  Prior to making the osteotomy I identified the appropriate position for the guidewire for a cannulated screw.  I advanced it into the proximal ulna.  I then measured the length decided to place a 95 mm screw.  I then drilled the proximal portion of  the ulna and olecranon and then placed a 95 mm screw to cut the past later repair and that I removed it and continued on with the osteotomy of the proximal ulna.  Using a oscillating saw I made a reverse chevron osteotomy of the ulna and finished it with an osteotome.  I then carefully dissected and released the olecranon off of the soft tissues and mobilized it proximally.  He was able to access the joint which had a single intra-articular split.  There is no visualized comminution however it was nearly 38 weeks old so it was difficult to fully assess.  There was a large amount of healing callus along the medial epicondyle region of the radial metaphysis that I had to take down and mobilized the articular surface back into reduction.  I then was able to reduce the articular surface and confirmed this with fluoroscopy.  I held it provisionally with a reduction tenaculum.  I then placed a 1.6 mm K wire to reinforce the fixation.  I then placed a positional a 2.7 millimeter screw across the troche left holding the intercondylar split in position.  The clamp and K wire were able to be  removed and then I turned my attention to the fixation of the metaphyseal fracture.  I confirmed alignment of the sagittal plane prior to fixation of the metaphysis and humeral shaft.  I chose a posterior lateral [employer] EVOS plate and positioned this appropriately and held it with a K wire.  I then drilled and placed nonlocking screws into the humeral shaft to bring the plate flush to bone.  I then turned my attention to the medial side.  I did have to perform a more soft tissue release to be able to place a direct medial plate.  K wire was used to position it distally.  I then placed nonlocking screws into the humeral shaft to bring the plate flush to bone.  I then proceeded to place locking screws in the medial condyle and I was able to get 1 across the intercondylar split.  Once I had medial fixation in place I turned my  attention to the distal lateral fixation.  I placed unicortical locking screws into the capitellum to hold the reduction.  I finished off by placing further humeral shaft screws.  Fluoroscopic imaging was obtained to show adequate alignment of the distal humerus.  I then clamped the osteotomy back in position and placed the guidewire for the 6.5 mm cannulated screw and then placed a 95 mm partially-threaded 6.5 mm cannulated screw with a washer and obtained excellent fixation of the osteotomy site.  Final fluoroscopic imaging was obtained.  The incisions were copiously irrigated.  A gram of vancomycin powder was placed to the incision.  Layered closure of 0 Vicryl, 2-0 Vicryl and 3-0 nylon was used to close the skin.  Sterile dressing was applied.  The patient was placed in a sling.  She was awoken from anesthesia and taken to the PACU in stable condition.  Post Op Plan/Instructions: Patient will be nonweightbearing to the right upper extremity.  She will receive postoperative Ancef.  She will receive aspirin for DVT prophylaxis.  We will have her start immediate range of motion.  No splinting or immobilization is needed.  I was present and performed the entire surgery.  Ulyses Southward, PA-C did assist me throughout the case. An assistant was necessary given the difficulty in approach, maintenance of reduction and ability to instrument the fracture.   Truitt Merle, MD [facility]

## 2021-07-11 NOTE — Anesthesia Procedure Notes (Signed)
Anesthesia Regional Block: Supraclavicular block   Pre-Anesthetic Checklist: , timeout performed,  Correct Patient, Correct Site, Correct Laterality,  Correct Procedure, Correct Position, site marked,  Risks and benefits discussed,  Surgical consent,  Pre-op evaluation,  At surgeon's request and post-op pain management  Laterality: Right  Prep: chloraprep       Needles:  Injection technique: Single-shot  Needle Type: Echogenic Stimulator Needle     Needle Length: 10cm  Needle Gauge: 21   Needle insertion depth: 6 cm   Additional Needles:   Procedures:,,,, ultrasound used (permanent image in chart),,    Narrative:  Start time: 07/11/2021 6:57 AM End time: 07/11/2021 7:02 AM Injection made incrementally with aspirations every 5 mL.  Performed by: Personally  Anesthesiologist: Josephine Igo, MD  Additional Notes: Timeout performed. Patient sedated. Relevant anatomy ID'd using [place]. Incremental 2-35ml injection of LA with frequent aspiration. Patient tolerated procedure well.    Right Supraclavicular block

## 2021-07-11 NOTE — Evaluation (Addendum)
Occupational Therapy Evaluation Patient Details Name: Erika Perry MRN: [id number] DOB: January 26, 1951 Today's Date: 07/11/2021   History of Present Illness Erika Perry is an 71 y.o. female with PMH significant for prediabetes and hypertension presenting for surgical fixation of right distal humerus fracture.  Patient sustained a fall while at work on [employer], landing on her right elbow.  Was seen at urgent care and found to have right intra-articular distal humerus fracture.  [name].   Clinical Impression   This 71 yo female admitted and underwent above presents to acute OT with decreased AROM of RUE due to surgery today and per her report a little unbalanced when up on her feet going to bathroom with nursing. Due to her recent fx and now s/p surgery of her RUE she is limited in her independence with basic ADLs, IADLs, driving, and working as she was independent prior to her fall on Mother's day. She will continue to benefit from acute OT with follow up OPOT post D/C.      Recommendations for follow up therapy are one component of a multi-disciplinary discharge planning process, led by the attending physician.  Recommendations may be updated based on patient status, additional functional criteria and insurance authorization.   Follow Up Recommendations  Outpatient OT    Assistance Recommended at Discharge Intermittent Supervision/Assistance  Patient can return home with the following A little help with walking and/or transfers;A little help with bathing/dressing/bathroom;Assistance with feeding;Assistance with cooking/housework;Assist for transportation    Functional Status Assessment  Patient has had a recent decline in their functional status and demonstrates the ability to make significant improvements in function in a reasonable and predictable amount of time.  Equipment Recommendations   Tub/shower seat       Precautions / Restrictions Precautions Precautions: Fall Precaution Comments: unstricted ROM per op note Required Braces or Orthoses: Sling Restrictions Weight Bearing Restrictions: Yes RUE Weight Bearing: Non weight bearing                  Vision Patient Visual Report: No change from baseline              Pertinent Vitals/Pain Pain Assessment Pain Assessment: No/denies pain (block still working)     Hand Dominance Right   Extremity/Trunk Assessment Upper Extremity Assessment Upper Extremity Assessment: RUE deficits/detail RUE Deficits / Details: trace shoulder elevation movement and 2/5 finger flexion/extension RUE Sensation: decreased light touch;decreased proprioception RUE Coordination: decreased fine motor;decreased gross motor           Communication Communication Communication: No difficulties   Cognition Arousal/Alertness: Awake/alert Behavior During Therapy: WFL for tasks assessed/performed Overall Cognitive Status: Within Functional Limits for tasks assessed                                          Exercises Other Exercises Other Exercises: Pt tolerated in supine 10 reps each of: AAROM for composite digit flexion/extension, shoulder abduction, and shoulder flexion (minimal range). The rest was PROM (minimal range since block not worn off yet)        Home Living Family/patient expects to be discharged to:: Private residence Living Arrangements: Children Available Help at Discharge: Family;Available PRN/intermittently               Bathroom Shower/Tub: Tub/shower unit;Curtain   Bathroom Toilet: Standard  Prior Functioning/Environment Prior Level of Function : Independent/Modified Independent;Working/employed;Driving                        OT Problem List: Decreased strength;Decreased range of motion;Impaired UE functional use      OT Treatment/Interventions:  Self-care/ADL training;Therapeutic exercise;Therapeutic activities;DME and/or AE instruction;Patient/family education    OT Goals(Current goals can be found in the care plan section) Acute Rehab OT Goals Patient Stated Goal: maybe home tomorrow depending on how I am doing OT Goal Formulation: With patient Time For Goal Achievement: 07/25/21 Potential to Achieve Goals: Good ADL Goals Pt Will Transfer to Toilet: with modified independence;ambulating;regular height toilet Pt Will Perform Toileting - Clothing Manipulation and hygiene: with modified independence;sit to/from stand Pt/caregiver will Perform Home Exercise Program: Increased ROM;Increased strength;Right Upper extremity;With written HEP provided;With Supervision Additional ADL Goal #1: Pt will be Mod I in and OOB for basic ADLs  OT Frequency: Min 2X/week       AM-PAC OT "6 Clicks" Daily Activity     Outcome Measure Help from another person eating meals?: A Lot Help from another person taking care of personal grooming?: A Lot Help from another person toileting, which includes using toliet, bedpan, or urinal?: A Lot Help from another person bathing (including washing, rinsing, drying)?: A Lot Help from another person to put on and taking off regular upper body clothing?: A Lot Help from another person to put on and taking off regular lower body clothing?: A Lot 6 Click Score: 12   End of Session    Activity Tolerance: Patient tolerated treatment well Patient left: in bed;with bed alarm set;with family/visitor present  OT Visit Diagnosis: Muscle weakness (generalized) (M62.81)                Time: 6283-1517 OT Time Calculation (min): 20 min Charges:  OT General Charges $OT Visit: 1 Visit OT Evaluation $OT Eval Moderate Complexity: 1 Mod  Ignacia Palma, OTR/L [facility] [phone] Office [phone]    Evette Georges 07/11/2021, 3:57 PM

## 2021-07-11 NOTE — H&P (Signed)
[facility] (OTS) H&P   Patient ID: Erika Perry MRN: [id number] DOB/AGE: [birth year] 71 y.o.   Reason for surgery: Right distal humerus fracture   HPI: Erika Perry is an 71 y.o. female with PMH significant for prediabetes (last Hgb A1c 5.4 on 06/16/2021) and hypertension presenting for surgical fixation of right distal humerus fracture.  Patient sustained a fall while at work on [employer], landing on her right elbow.  Was seen at urgent care and found to have right intra-articular distal humerus fracture.  Patient was placed in a long-arm splint and referred to [facility] for evaluation and management. She presents now for surgical fixation of her fracture. Pain has been manageable.  Denies any significant numbness or tingling to the right upper extremity.  No other injuries from her fall.  No previous injury or surgery to the right upper extremity. Patient is right hand dominant         Past Medical History:  Diagnosis Date   HLD (hyperlipidemia)     Hypertension     Pre-diabetes             Past Surgical History:  Procedure Laterality Date   CESAREAN SECTION   1981, 63           Family History  Problem Relation Age of Onset   Cancer Mother     Diabetes Mother     Hypertension Mother     Other Father          unknown medical history      Social History:  reports that she has never smoked. She has never used smokeless tobacco. She reports that she does not drink alcohol and does not use drugs.   Allergies:       Allergies  Allergen Reactions   Bactrim [Sulfamethoxazole-Trimethoprim]        Nausea, no appitite   Penicillins        Other reaction(s): Nausea/Vomiting ()      Medications: I have reviewed the patient's current medications. Prior to Admission:  No medications prior to admission.      ROS: Constitutional: No fever or chills Vision: No changes in vision ENT: No difficulty swallowing CV: No chest pain Pulm: No  SOB or wheezing GI: No nausea or vomiting GU: No urgency or inability to hold urine Skin: No poor wound healing Neurologic: No numbness or tingling Psychiatric: No depression or anxiety Heme: No bruising Allergic: No reaction to medications or food     Exam: Height 5\' 2"  (1.575 m), weight 83.9 kg. General: No acute distress Orientation: Alert and oriented x3 Mood and Affect: Mood and affect appropriate, pleasant and cooperative Gait: Within normal limits Coordination and balance: Within normal limits   Right upper extremity: Long-arm splint in place, did not fully remove for exam.  Swelling present but appropriate.  Compartments compressible.  Able to wiggle fingers.  Nontender above splint.  Hand warm and well-perfused   Left upper extremity: Skin without lesions. No tenderness to palpation. Full painless ROM, full strength in each muscle groups without evidence of instability.     Medical Decision Making: Data: Imaging: CT scan right elbow shows fracture through the distal humerus with intra-articular extension.  No obvious dislocation noted.   Labs: No results found for this or any previous visit (from the past 168 hour(s)).     Medical history and chart was reviewed and case discussed with medical provider.   Assessment/Plan: 71 year old female with  right distal humerus fracture   Patient with significant injury to the right upper extremity which require surgical intervention.  Recommend proceeding with open reduction internal fixation of the right distal humerus.  Risks and benefits of the procedure were discussed with the patient.   Risks discussed included bleeding, infection, malunion, nonunion, damage to surrounding nerves and blood vessels, pain, hardware prominence or irritation, hardware failure, elbow stiffness, post-traumatic arthritis, DVT/PE, compartment syndrome, and even anesthesia complications.  Patient states understanding of these risk.  She agrees to proceed  with surgery.  Consent will be obtained.  We will plan to admit the patient overnight for pain control and therapies.  Roby Lofts, MD [facility] [phone] (office) [email or web address]

## 2021-07-11 NOTE — Discharge Instructions (Addendum)
[facility] Discharge Instructions   General Discharge Instructions  WEIGHT BEARING STATUS:Non-weightbearing right upper extremity  RANGE OF MOTION/ACTIVITY: ok for unrestricted elbow range of motion as tolerated  Wound Care:You may remove your surgical dressing on post-op day #2 (Sunday 07/13/21). Incisions can be left open to air if there is no drainage. If incision continues to have drainage, follow wound care instructions below. Okay to shower if no drainage from incisions.  DVT/PE prophylaxis: Aspirin 81 mg daily x 30 days  Diet: as you were eating previously.  Can use over the counter stool softeners and bowel preparations, such as Miralax, to help with bowel movements.  Narcotics can be constipating.  Be sure to drink plenty of fluids  PAIN MEDICATION USE AND EXPECTATIONS  You have likely been given narcotic medications to help control your pain.  After a traumatic event that results in an fracture (broken bone) with or without surgery, it is ok to use narcotic pain medications to help control one's pain.  We understand that everyone responds to pain differently and each individual patient will be evaluated on a regular basis for the continued need for narcotic medications. Ideally, narcotic medication use should last no more than 6-8 weeks (coinciding with fracture healing).   As a patient it is your responsibility as well to monitor narcotic medication use and report the amount and frequency you use these medications when you come to your office visit.   We would also advise that if you are using narcotic medications, you should take a dose prior to therapy to maximize you participation.  IF YOU ARE ON NARCOTIC MEDICATIONS IT IS NOT PERMISSIBLE TO OPERATE A MOTOR VEHICLE (MOTORCYCLE/CAR/TRUCK/MOPED) OR HEAVY MACHINERY DO NOT MIX NARCOTICS WITH OTHER CNS (CENTRAL NERVOUS SYSTEM) DEPRESSANTS SUCH AS ALCOHOL   STOP SMOKING OR USING NICOTINE PRODUCTS!!!!  As discussed  nicotine severely impairs your body's ability to heal surgical and traumatic wounds but also impairs bone healing.  Wounds and bone heal by forming microscopic blood vessels (angiogenesis) and nicotine is a vasoconstrictor (essentially, shrinks blood vessels).  Therefore, if vasoconstriction occurs to these microscopic blood vessels they essentially disappear and are unable to deliver necessary nutrients to the healing tissue.  This is one modifiable factor that you can do to dramatically increase your chances of healing your injury.    (This means no smoking, no nicotine gum, patches, etc)  DO NOT USE NONSTEROIDAL ANTI-INFLAMMATORY DRUGS (NSAID'S)  Using products such as Advil (ibuprofen), Aleve (naproxen), Motrin (ibuprofen) for additional pain control during fracture healing can delay and/or prevent the healing response.  If you would like to take over the counter (OTC) medication, Tylenol (acetaminophen) is ok.  However, some narcotic medications that are given for pain control contain acetaminophen as well. Therefore, you should not exceed more than 4000 mg of tylenol in a day if you do not have liver disease.  Also note that there are may OTC medicines, such as cold medicines and allergy medicines that my contain tylenol as well.  If you have any questions about medications and/or interactions please ask your doctor/PA or your pharmacist.      ICE AND ELEVATE INJURED/OPERATIVE EXTREMITY  Using ice and elevating the injured extremity above your heart can help with swelling and pain control.  Icing in a pulsatile fashion, such as 20 minutes on and 20 minutes off, can be followed.    Do not place ice directly on skin. Make sure there is a barrier between to skin  and the ice pack.    Using frozen items such as frozen peas works well as the conform nicely to the are that needs to be iced.  USE AN ACE WRAP OR TED HOSE FOR SWELLING CONTROL  In addition to icing and elevation, Ace wraps or TED hose are  used to help limit and resolve swelling.  It is recommended to use Ace wraps or TED hose until you are informed to stop.    When using Ace Wraps start the wrapping distally (farthest away from the body) and wrap proximally (closer to the body)   Example: If you had surgery on your leg or thing and you do not have a splint on, start the ace wrap at the toes and work your way up to the thigh        If you had surgery on your upper extremity and do not have a splint on, start the ace wrap at your fingers and work your way up to the upper arm  [place]: [phone]   VISIT OUR WEBSITE FOR ADDITIONAL INFORMATION: [email or web address]     Discharge Wound Care Instructions  Do NOT apply any ointments, solutions or lotions to pin sites or surgical wounds.  These prevent needed drainage and even though solutions like hydrogen peroxide kill bacteria, they also damage cells lining the pin sites that help fight infection.  Applying lotions or ointments can keep the wounds moist and can cause them to breakdown and open up as well. This can increase the risk for infection. When in doubt call the office.  If any drainage is noted, use one layer of adaptic or Mepitel, then gauze, Kerlix, and an ace wrap. - These dressing supplies should be available at local medical supply stores [facility], [facility], etc) as well as Management consultant ([facility], [facility], [employer], etc)  Once the incision is completely dry and without drainage, it may be left open to air out.  Showering may begin 36-48 hours later.  Cleaning gently with soap and water.

## 2021-07-11 NOTE — Anesthesia Postprocedure Evaluation (Signed)
Anesthesia Post Note  Patient: Erika Perry  Procedure(s) Performed: OPEN REDUCTION INTERNAL FIXATION (ORIF) DISTAL HUMERUS FRACTURE (Right: Arm Upper)     Patient location during evaluation: PACU Anesthesia Type: General Level of consciousness: awake and alert and oriented Pain management: pain level controlled Vital Signs Assessment: post-procedure vital signs reviewed and stable Respiratory status: spontaneous breathing, nonlabored ventilation and respiratory function stable Cardiovascular status: blood pressure returned to baseline and stable Postop Assessment: no apparent nausea or vomiting Anesthetic complications: no   No notable events documented.  Last Vitals:  Vitals:   07/11/21 1102 07/11/21 1117  BP: (!) 129/99 126/64  Pulse: 74 71  Resp: 15 14  Temp:    SpO2: 92% 92%    Last Pain:  Vitals:   07/11/21 1102  TempSrc:   PainSc: Asleep                 Hamish Banks A.

## 2021-07-11 NOTE — Transfer of Care (Signed)
Immediate Anesthesia Transfer of Care Note  Patient: Erika Perry  Procedure(s) Performed: OPEN REDUCTION INTERNAL FIXATION (ORIF) DISTAL HUMERUS FRACTURE (Right: Arm Upper)  Patient Location: PACU  Anesthesia Type:GA combined with regional for post-op pain  Level of Consciousness: awake  Airway & Oxygen Therapy: Patient Spontanous Breathing and Patient connected to nasal cannula oxygen  Post-op Assessment: Report given to RN and Post -op Vital signs reviewed and stable  Post vital signs: Reviewed and stable  Last Vitals:  Vitals Value Taken Time  BP 147/77 07/11/21 1002  Temp    Pulse 74 07/11/21 1003  Resp 17 07/11/21 1003  SpO2 91 % 07/11/21 1003  Vitals shown include unvalidated device data.  Last Pain:  Vitals:   07/11/21 0626  TempSrc:   PainSc: 0-No pain         Complications: No notable events documented.

## 2021-07-12 ENCOUNTER — Encounter (HOSPITAL_COMMUNITY): Payer: Self-pay | Admitting: Student

## 2021-07-12 DIAGNOSIS — S72461A Displaced supracondylar fracture with intracondylar extension of lower end of right femur, initial encounter for closed fracture: Secondary | ICD-10-CM | POA: Diagnosis not present

## 2021-07-12 DIAGNOSIS — S42401A Unspecified fracture of lower end of right humerus, initial encounter for closed fracture: Secondary | ICD-10-CM

## 2021-07-12 DIAGNOSIS — R531 Weakness: Secondary | ICD-10-CM | POA: Diagnosis not present

## 2021-07-12 HISTORY — DX: Unspecified fracture of lower end of right humerus, initial encounter for closed fracture: S42.401A

## 2021-07-12 LAB — BASIC METABOLIC PANEL
Anion gap: 8 (ref 5–15)
BUN: 20 mg/dL (ref 8–23)
CO2: 26 mmol/L (ref 22–32)
Calcium: 8.6 mg/dL — ABNORMAL LOW (ref 8.9–10.3)
Chloride: 101 mmol/L (ref 98–111)
Creatinine, Ser: 0.7 mg/dL (ref 0.44–1.00)
GFR, Estimated: >60 mL/min (ref >60)
Glucose, Bld: 174 mg/dL — ABNORMAL HIGH (ref 70–99)
Potassium: 4.7 mmol/L (ref 3.5–5.1)
Sodium: 135 mmol/L (ref 135–145)

## 2021-07-12 LAB — CBC
HCT: 31.4 % — ABNORMAL LOW (ref 36.0–46.0)
Hemoglobin: 10.5 g/dL — ABNORMAL LOW (ref 12.0–15.0)
MCH: 29.3 pg (ref 26.0–34.0)
MCHC: 33.4 g/dL (ref 30.0–36.0)
MCV: 87.7 fL (ref 80.0–100.0)
Platelets: 196 10*3/uL (ref 150–400)
RBC: 3.58 MIL/uL — ABNORMAL LOW (ref 3.87–5.11)
RDW: 14.9 % (ref 11.5–15.5)
WBC: 8.8 10*3/uL (ref 4.0–10.5)
nRBC: 0 % (ref 0.0–0.2)

## 2021-07-12 NOTE — Plan of Care (Signed)
  Problem: Pain Managment: Goal: General experience of comfort will improve Outcome: Progressing   Problem: Safety: Goal: Ability to remain free from injury will improve Outcome: Progressing   Problem: Skin Integrity: Goal: Risk for impaired skin integrity will decrease Outcome: Progressing   

## 2021-07-12 NOTE — Progress Notes (Signed)
Orthopaedic Trauma Service Progress Note  Patient ID: Erika Perry MRN: [id number] DOB/AGE: 70-Jun-1952 70 y.o.  Subjective:  Doing well Block is still working No new complaints in particular  Does not feel comfortable to go home today.  Wants to go home tomorrow.  Feel this is completely reasonable  Patient is very independent She actually got her working at [employer], has been working there in some form or fashion for about 40 years  Family is very involved and helpful.  They will be frequently checking on her when she goes home  Review of Systems  Constitutional:  Negative for chills and fever.  Respiratory:  Negative for shortness of breath.   Cardiovascular:  Negative for chest pain and palpitations.  Gastrointestinal:  Negative for nausea and vomiting.   Objective:   VITALS:   Vitals:   07/11/21 1247 07/11/21 1453 07/11/21 2105 07/12/21 0421  BP: 137/76 131/78 133/65 (!) 113/55  Pulse: 80 89 97 76  Resp: 16 16 16 16   Temp: 98.3 F (36.8 C) 97.8 F (36.6 C) 97.6 F (36.4 C) 98 F (36.7 C)  TempSrc: Oral Oral    SpO2: 96% 95% 95% 97%  Weight:      Height:        Estimated body mass index is 35.32 kg/m as calculated from the following:   Height as of this encounter: 5\' 2"  (1.575 m).   Weight as of this encounter: 87.6 kg.   Intake/Output      06/02 0701 06/03 0700 06/03 0701 06/04 0700   I.V. (mL/kg) 1009 (11.5)    IV Piggyback 100    Total Intake(mL/kg) 1109 (12.7)    Blood 50    Total Output 50    Net +1059         Urine Occurrence 1 x      LABS  Results for orders placed or performed during the hospital encounter of 07/11/21 (from the past 24 hour(s))  Basic metabolic panel     Status: Abnormal   Collection Time: 07/12/21  2:58 AM  Result Value Ref Range   Sodium 135 135 - 145 mmol/L   Potassium 4.7 3.5 - 5.1 mmol/L   Chloride 101 98 - 111 mmol/L   CO2 26 22 -  32 mmol/L   Glucose, Bld 174 (H) 70 - 99 mg/dL   BUN 20 8 - 23 mg/dL   Creatinine, Ser 09/10/21 0.44 - 1.00 mg/dL   Calcium 8.6 (L) 8.9 - 10.3 mg/dL   GFR, Estimated 09/11/21 6.65 mL/min   Anion gap 8 5 - 15  CBC     Status: Abnormal   Collection Time: 07/12/21  2:58 AM  Result Value Ref Range   WBC 8.8 4.0 - 10.5 K/uL   RBC 3.58 (L) 3.87 - 5.11 MIL/uL   Hemoglobin 10.5 (L) 12.0 - 15.0 g/dL   HCT >35 (L) 09/11/21 - 70.1 %   MCV 87.7 80.0 - 100.0 fL   MCH 29.3 26.0 - 34.0 pg   MCHC 33.4 30.0 - 36.0 g/dL   RDW 77.9 39.0 - 30.0 %   Platelets 196 150 - 400 K/uL   nRBC 0.0 0.0 - 0.2 %     PHYSICAL EXAM:   Gen: Sitting in bedside chair.  Awake and alert, no acute distress, very pleasant.  Daughter and son at bedside Lungs: Unlabored Cardiac: Regular Ext:       Right upper extremity  Sling is in place  Compressive dressing in place  There is some strikethrough noted on the Ace wrap however this appears to be dried.  No fresh blood noted  Patient is moving her digits  No sensory functions noted given block is still working  Mild swelling distally but expected degree of swelling  Extremity is warm  + DP pulse  Compartments are soft  No pain with passive stretching of digits or wrist   Assessment/Plan: 1 Day Post-Op   Principal Problem:   Closed fracture of right distal humerus Active Problems:   Hypertension   Anti-infectives (From admission, onward)    Start     Dose/Rate Route Frequency Ordered Stop   07/11/21 1400  ceFAZolin (ANCEF) IVPB 2g/100 mL premix        2 g 200 mL/hr over 30 Minutes Intravenous Every 8 hours 07/11/21 1233 07/12/21 0604   07/11/21 0824  vancomycin (VANCOCIN) powder  Status:  Discontinued          As needed 07/11/21 0824 07/11/21 0958   07/11/21 0600  ceFAZolin (ANCEF) IVPB 2g/100 mL premix        2 g 200 mL/hr over 30 Minutes Intravenous On call to O.R. 07/11/21 7564 07/11/21 0744     .  POD/HD#:   71 year old right-hand-dominant female fall at  work with right distal humerus fracture s/p ORIF R distal humerus and olecranon osteotomy   -R distal humerus fracture s/p ORIF   NWB R UEx   No pushing or pulling   No active elbow extension against resistance  Unrestricted lumbar range of motion otherwise  No range of motion restrictions to shoulder, forearm wrist and hand  Sling can be off when in bed on when mobilizing  Orthoplast splint at nighttime only  Ice and elevate for swelling and pain control  Dressing changes tomorrow   PT and OT evaluations  - Pain management:  Multimodal  - ABL anemia/Hemodynamics  Stable  Check in the morning  - Medical issues   Hypertension   BP is well controlled  - DVT/PE prophylaxis:  Low-dose aspirin.  Does not require additional pharmacologic's as this is an upper extremity injury and patient is mobile - ID:   Perioperative antibiotics - Metabolic Bone Disease:  Check vitamin D levels  Given age patient should start to get routine DEXA scans - Activity:  Up with assistance - FEN/GI prophylaxis/Foley/Lines:  Regular diet  Okay to bring food from home  - Impediments to fracture healing:  Low energy fracture  Age - Dispo:  Therapy evaluations  Home tomorrow  Follow-up with orthopedics in 10 to 14 days     Mearl Latin, PA-C [phone] (C) 07/12/2021, 11:11 AM  [facility] [address] [place] [address] [phone] Collier Bullock (F)    After 5pm and on the weekends please log on to Amion, go to orthopaedics and the look under the [facility] Call for the provider(s) on call. You can also call our office at [phone] and then follow the prompts to be connected to the call team.   Patient ID: Erika Perry, female   DOB: [birth year], 71 y.o.   MRN: [id number]

## 2021-07-12 NOTE — Evaluation (Signed)
Physical Therapy Evaluation Patient Details Name: Erika Perry MRN: [id number] DOB: 12/28/1950 Today's Date: 07/12/2021  History of Present Illness  CLARICE ZULAUF is an 71 y.o. female with PMH significant for prediabetes and hypertension presenting for surgical fixation of right distal humerus fracture.  Patient sustained a fall while at work on [employer], landing on her right elbow.  Was seen at urgent care and found to have right intra-articular distal humerus fracture.  [name].   Clinical Impression  PT eval complete. Pt mod I bed mobility. Supervision provided for transfers, ambulation 200' without AD, and ascend/descend 3 steps with rail.  Pt reports she lives with daughter who can provided needed level of assist. Pt demonstrates good understanding and ability to maintain NWB RUE. No further skilled PT intervention indicated. PT signing off.     Recommendations for follow up therapy are one component of a multi-disciplinary discharge planning process, led by the attending physician.  Recommendations may be updated based on patient status, additional functional criteria and insurance authorization.  Follow Up Recommendations No PT follow up    Assistance Recommended at Discharge PRN  Patient can return home with the following  Help with stairs or ramp for entrance;Assist for transportation;Assistance with cooking/housework;A little help with bathing/dressing/bathroom    Equipment Recommendations None recommended by PT  Recommendations for Other Services       Functional Status Assessment Patient has had a recent decline in their functional status and demonstrates the ability to make significant improvements in function in a reasonable and predictable amount of time.     Precautions / Restrictions Precautions Precautions: Fall Precaution Comments: unstricted ROM per op note Required Braces  or Orthoses: Sling Restrictions RUE Weight Bearing: Non weight bearing      Mobility  Bed Mobility Overal bed mobility: Modified Independent             General bed mobility comments: +rail, HOB elevated, increased time    Transfers Overall transfer level: Needs assistance Equipment used: None Transfers: Sit to/from Stand Sit to Stand: Supervision                Ambulation/Gait Ambulation/Gait assistance: Supervision Gait Distance (Feet): 200 Feet Assistive device: None Gait Pattern/deviations: Step-through pattern, Wide base of support, Decreased stride length Gait velocity: WFL Gait velocity interpretation: >2.62 ft/sec, indicative of community ambulatory   General Gait Details: mildly unsteady at times but no physical assist needed, no overt LOB  Stairs Stairs: Yes Stairs assistance: Supervision Stair Management: One rail Right, One rail Left, Forwards, Alternating pattern Number of Stairs: 3 General stair comments: L rail for ascent and R rail for descent, supervision for safety  Wheelchair Mobility    Modified Rankin (Stroke Patients Only)       Balance Overall balance assessment: Mild deficits observed, not formally tested                                           Pertinent Vitals/Pain Pain Assessment Pain Assessment: No/denies pain (block still in place)    Home Living Family/patient expects to be discharged to:: Private residence Living Arrangements: Children Available Help at Discharge: Family;Available PRN/intermittently Type of Home: House Home Access: Stairs to enter Entrance Stairs-Rails: Doctor, general practice of Steps: 3   Home Layout: One level Home Equipment: None  Prior Function Prior Level of Function : Independent/Modified Independent;Working/employed;Driving                     Hand Dominance   Dominant Hand: Right    Extremity/Trunk Assessment   Upper Extremity  Assessment Upper Extremity Assessment: Defer to OT evaluation    Lower Extremity Assessment Lower Extremity Assessment: Overall WFL for tasks assessed    Cervical / Trunk Assessment Cervical / Trunk Assessment: Normal  Communication   Communication: No difficulties  Cognition Arousal/Alertness: Awake/alert Behavior During Therapy: WFL for tasks assessed/performed Overall Cognitive Status: Within Functional Limits for tasks assessed                                          General Comments General comments (skin integrity, edema, etc.): Pt on 2L O2 on arrival. [place] removed and pt mobilized on RA. HR 89 SpO2 93%    Exercises     Assessment/Plan    PT Assessment Patient does not need any further PT services  PT Problem List         PT Treatment Interventions      PT Goals (Current goals can be found in the Care Plan section)  Acute Rehab PT Goals Patient Stated Goal: home PT Goal Formulation: All assessment and education complete, DC therapy    Frequency       Co-evaluation               AM-PAC PT "6 Clicks" Mobility  Outcome Measure Help needed turning from your back to your side while in a flat bed without using bedrails?: None Help needed moving from lying on your back to sitting on the side of a flat bed without using bedrails?: A Little Help needed moving to and from a bed to a chair (including a wheelchair)?: A Little Help needed standing up from a chair using your arms (e.g., wheelchair or bedside chair)?: A Little Help needed to walk in hospital room?: A Little Help needed climbing 3-5 steps with a railing? : A Little 6 Click Score: 19    End of Session Equipment Utilized During Treatment: Gait belt;Other (comment) (RUE sling) Activity Tolerance: Patient tolerated treatment well Patient left: in chair;with call bell/phone within reach Nurse Communication: Mobility status;Other (comment) (PT now on RA.) PT Visit Diagnosis: Other  abnormalities of gait and mobility (R26.89)    Time: 8563-1497 PT Time Calculation (min) (ACUTE ONLY): 17 min   Charges:   PT Evaluation $PT Eval Low Complexity: 1 Low          Aida Raider, PT  Office # [phone]   Ilda Foil 07/12/2021, 9:12 AM

## 2021-07-12 NOTE — Progress Notes (Signed)
Occupational Therapy Treatment Patient Details Name: Erika Perry MRN: [id number] DOB: [birth year] Today's Date: 07/12/2021   History of present illness Erika Perry is an 71 y.o. female with PMH significant for prediabetes and hypertension presenting for surgical fixation of right distal humerus fracture.  Patient sustained a fall while at work on [employer] Day, landing on her right elbow.  Was seen at urgent care and found to have right intra-articular distal humerus fracture.  [name].   OT comments  This 71 yo female seen today to facilitate more AROM of RUE however block still actively working and pt really does not have any more AROM than she has yesterday (maybe a little more in digits). She was able to get up and walk on unit with me with S and toilet as well with S. She will continue to benefit from acute OT with follow up OPOT.   Recommendations for follow up therapy are one component of a multi-disciplinary discharge planning process, led by the attending physician.  Recommendations may be updated based on patient status, additional functional criteria and insurance authorization.    Follow Up Recommendations  Outpatient OT    Assistance Recommended at Discharge Intermittent Supervision/Assistance  Patient can return home with the following  A little help with walking and/or transfers;A little help with bathing/dressing/bathroom;Assistance with feeding;Assistance with cooking/housework;Assist for transportation   Equipment Recommendations  Tub/shower seat (per pt and son pt is worker's comp)       Precautions / Restrictions Precautions Precautions: Fall Precaution Comments: unstricted ROM per op note--no resistive elbow extension Required Braces or Orthoses: Sling Restrictions Weight Bearing Restrictions: Yes RUE Weight Bearing: Non weight bearing Other Position/Activity Restrictions: Per  PA note 6/3 pt to wear sling when up and about, can have off in bed, wear splint in bed at night.       Mobility Bed Mobility Overal bed mobility: Modified Independent             General bed mobility comments: +rail, HOB elevated, increased time    Transfers Overall transfer level: Needs assistance Equipment used: None Transfers: Sit to/from Stand Sit to Stand: Supervision                 Balance Overall balance assessment: Mild deficits observed, not formally tested                                         ADL either performed or assessed with clinical judgement   ADL Overall ADL's : Needs assistance/impaired Eating/Feeding: Set up;Sitting   Grooming: Supervision/safety;Standing   Upper Body Bathing: Minimal assistance;Sitting   Lower Body Bathing: Supervison/ safety;Sit to/from stand   Upper Body Dressing : Maximal assistance;Sitting   Lower Body Dressing: Moderate assistance Lower Body Dressing Details (indicate cue type and reason): S sit<>stand Toilet Transfer: Supervision/safety;Ambulation;Grab bars;Comfort height toilet   Toileting- Clothing Manipulation and Hygiene: Supervision/safety;Sit to/from stand               Vision Patient Visual Report: No change from baseline            Cognition Arousal/Alertness: Awake/alert Behavior During Therapy: WFL for tasks assessed/performed Overall Cognitive Status: Within Functional Limits for tasks assessed  Exercises Other Exercises Other Exercises: Pt tolerated in supine 10 reps each of: AROM for composite digit flexion/extension, AAROM for shoulder abduction, and shoulder flexion (minimal range). The rest was PROM (minimal range since block still not worn off yet). Encouraged her to continue to move her fingers and keep arm propped up on pillows to help control swelling            Pertinent Vitals/ Pain       Pain  Assessment Pain Assessment: No/denies pain         Frequency  Min 2X/week        Progress Toward Goals  OT Goals(current goals can now be found in the care plan section)  Progress towards OT goals: Progressing toward goals  Acute Rehab OT Goals Patient Stated Goal: probably home tomorrow OT Goal Formulation: With patient Time For Goal Achievement: 07/25/21 Potential to Achieve Goals: Good  Plan Discharge plan remains appropriate       AM-PAC OT "6 Clicks" Daily Activity     Outcome Measure   Help from another person eating meals?: A Little Help from another person taking care of personal grooming?: A Little Help from another person toileting, which includes using toliet, bedpan, or urinal?: A Little Help from another person bathing (including washing, rinsing, drying)?: A Lot Help from another person to put on and taking off regular upper body clothing?: A Lot Help from another person to put on and taking off regular lower body clothing?: A Lot 6 Click Score: 15    End of Session Equipment Utilized During Treatment: Gait belt (sling)  OT Visit Diagnosis: Muscle weakness (generalized) (M62.81)   Activity Tolerance Patient tolerated treatment well   Patient Left in bed;with bed alarm set   Nurse Communication  (son should be bringing arm splint back in and pt is to wear at night)        Time: 9563-8756 OT Time Calculation (min): 23 min  Charges: OT General Charges $OT Visit: 1 Visit OT Treatments $Self Care/Home Management : 8-22 mins $Therapeutic Exercise: 8-22 mins  Ignacia Palma, OTR/L Acute Re[facility] Services [facility] [phone] Office [phone]    Evette Georges 07/12/2021, 5:36 PM

## 2021-07-12 NOTE — TOC Progression Note (Signed)
Transition of Care [facility]) - Progression Note    Patient Details  Name: Erika Perry MRN: [id number] Date of Birth: 09-18-1950  Transition of Care [facility]) CM/SW Contact  Bartholomew Crews, RN Phone Number: [phone] 07/12/2021, 4:24 PM  Clinical Narrative:     Referral to [employer] for shower stool. Will deliver to bedside prior to discharge.   Expected Discharge Plan: Home/Self Care Barriers to Discharge: Continued Medical Work up  Expected Discharge Plan and Services Expected Discharge Plan: Home/Self Care In-house Referral: NA Discharge Planning Services: CM Consult Post Acute Care Choice: NA Living arrangements for the past 2 months: Single Family Home                 DME Arranged: Shower stool DME Agency: [employer] Date DME Agency Contacted: 07/12/21 Time DME Agency Contacted: [phone] Representative spoke with at DME Agency: [place]: NA [place] Agency: NA         Social Determinants of Health ([place]) Interventions    Readmission Risk Interventions     View : No data to display.

## 2021-07-12 NOTE — Care Management Obs Status (Signed)
[place] [facility] NOTIFICATION   Patient Details  Name: Erika Perry MRN: [id number] Date of Birth: [birth year]   Medicare Observation Status Notification Given:  Yes    Bartholomew Crews, RN 07/12/2021, 2:11 PM

## 2021-07-12 NOTE — TOC Initial Note (Signed)
Transition of Care [facility]) - Initial/Assessment Note    Patient Details  Name: Erika Perry MRN: [id number] Date of Birth: 03-Oct-1950  Transition of Care [facility]) CM/SW Contact:    Bess Kinds, RN Phone Number: [phone] 07/12/2021, 2:08 PM  Clinical Narrative:                  Spoke with patient at the bedside to discuss post acute transition. Patient advised that her hospital stay should be covered under workman's comp, and that her workman comp nurse would call the hospital to straighten it all out. Family to provide assistance at home and transportation home at discharge. No further TOC  needs identified at this time.   Expected Discharge Plan: Home/Self Care Barriers to Discharge: Continued Medical Work up   Patient Goals and CMS Choice   CMS Medicare.gov Compare Post Acute Care list provided to:: Patient Choice offered to / list presented to : NA  Expected Discharge Plan and Services Expected Discharge Plan: Home/Self Care In-house Referral: NA Discharge Planning Services: CM Consult Post Acute Care Choice: NA Living arrangements for the past 2 months: Single Family Home                 DME Arranged: N/A DME Agency: NA       HH Arranged: NA HH Agency: NA        Prior Living Arrangements/Services Living arrangements for the past 2 months: Single Family Home Lives with:: Self Patient language and need for interpreter reviewed:: Yes Do you feel safe going back to the place where you live?: Yes      Need for Family Participation in Patient Care: Yes (Comment) Care giver support system in place?: Yes (comment)   Criminal Activity/Legal Involvement Pertinent to Current Situation/Hospitalization: No - Comment as needed  Activities of Daily Living Home Assistive Devices/Equipment: Eyeglasses ADL Screening (condition at time of admission) Patient's cognitive ability adequate to safely complete daily activities?: Yes Is the patient deaf or have difficulty hearing?:  Yes Does the patient have difficulty seeing, even when wearing glasses/contacts?: No Does the patient have difficulty concentrating, remembering, or making decisions?: No Patient able to express need for assistance with ADLs?: Yes Does the patient have difficulty dressing or bathing?: Yes Independently performs ADLs?: No Communication: Independent Dressing (OT): Needs assistance Is this a change from baseline?: Pre-admission baseline Grooming: Needs assistance Is this a change from baseline?: Pre-admission baseline Feeding: Independent Bathing: Needs assistance Is this a change from baseline?: Pre-admission baseline Toileting: Needs assistance Is this a change from baseline?: Pre-admission baseline In/Out Bed: Independent Walks in Home: Independent Does the patient have difficulty walking or climbing stairs?: No Weakness of Legs: None Weakness of Arms/Hands: Right  Permission Sought/Granted                  Emotional Assessment Appearance:: Appears stated age Attitude/Demeanor/Rapport: Engaged Affect (typically observed): Accepting Orientation: : Oriented to Self, Oriented to Place, Oriented to  Time, Oriented to Situation Alcohol / Substance Use: Not Applicable Psych Involvement: No (comment)  Admission diagnosis:  Closed fracture of right distal humerus [S42.401A] Patient Active Problem List   Diagnosis Date Noted   Closed fracture of right distal humerus 07/12/2021   Body mass index (BMI) of 37.0-37.9 in adult 07/17/2020   Prediabetes 07/17/2020   Urinary tract infection without hematuria 04/20/2020   Hyperlipidemia 07/18/2018   Chronic venous insufficiency 03/24/2017   Lymphedema 03/24/2017   Hypertension 02/23/2017   Bilateral lower extremity edema 02/23/2017  PCP:  Glori Luis, MD Pharmacy:   [facility] [phone] - [place], [place] - [address] [place] [address] Phone: [phone] Fax: [phone]     Social Determinants of Health  (SDOH) Interventions    Readmission Risk Interventions     View : No data to display.

## 2021-07-13 ENCOUNTER — Encounter (HOSPITAL_COMMUNITY): Payer: Self-pay | Admitting: Student

## 2021-07-13 DIAGNOSIS — S72461A Displaced supracondylar fracture with intracondylar extension of lower end of right femur, initial encounter for closed fracture: Secondary | ICD-10-CM | POA: Diagnosis not present

## 2021-07-13 DIAGNOSIS — E559 Vitamin D deficiency, unspecified: Secondary | ICD-10-CM

## 2021-07-13 HISTORY — DX: Vitamin D deficiency, unspecified: E55.9

## 2021-07-13 LAB — BASIC METABOLIC PANEL
Anion gap: 5 (ref 5–15)
BUN: 18 mg/dL (ref 8–23)
CO2: 31 mmol/L (ref 22–32)
Calcium: 8.4 mg/dL — ABNORMAL LOW (ref 8.9–10.3)
Chloride: 101 mmol/L (ref 98–111)
Creatinine, Ser: 0.61 mg/dL (ref 0.44–1.00)
GFR, Estimated: >60 mL/min (ref >60)
Glucose, Bld: 143 mg/dL — ABNORMAL HIGH (ref 70–99)
Potassium: 4.6 mmol/L (ref 3.5–5.1)
Sodium: 137 mmol/L (ref 135–145)

## 2021-07-13 LAB — CBC
HCT: 31.1 % — ABNORMAL LOW (ref 36.0–46.0)
Hemoglobin: 10.3 g/dL — ABNORMAL LOW (ref 12.0–15.0)
MCH: 29.4 pg (ref 26.0–34.0)
MCHC: 33.1 g/dL (ref 30.0–36.0)
MCV: 88.9 fL (ref 80.0–100.0)
Platelets: 178 10*3/uL (ref 150–400)
RBC: 3.5 MIL/uL — ABNORMAL LOW (ref 3.87–5.11)
RDW: 15.4 % (ref 11.5–15.5)
WBC: 7.1 10*3/uL (ref 4.0–10.5)
nRBC: 0 % (ref 0.0–0.2)

## 2021-07-13 LAB — VITAMIN D 25 HYDROXY (VIT D DEFICIENCY, FRACTURES): Vit D, 25-Hydroxy: 11.55 ng/mL — ABNORMAL LOW (ref 30–100)

## 2021-07-13 MED ORDER — VITAMIN D (ERGOCALCIFEROL) 1.25 MG (50000 UNIT) PO CAPS: 50000.0000 [IU] | ORAL_CAPSULE | ORAL | 0 refills | Status: DC

## 2021-07-13 MED ORDER — VITAMIN D 125 MCG (5000 UT) PO CAPS: 1.0000 | ORAL_CAPSULE | Freq: Every day | ORAL | 6 refills | Status: DC

## 2021-07-13 NOTE — Progress Notes (Signed)
Occupational Therapy Treatment and Discharge Patient Details Name: Erika Perry MRN: [id number] DOB: [birth year] Today's Date: 07/13/2021   History of present illness Erika Perry is an 71 y.o. female with PMH significant for prediabetes and hypertension presenting for surgical fixation of right distal humerus fracture.  Patient sustained a fall while at work on [employer] Day, landing on her right elbow.  Was seen at urgent care and found to have right intra-articular distal humerus fracture.  [name].   OT comments  This 71 yo female getting more active return in her RUE today and thus more painful as well. Exercise sheets for RUE given as well as encouragement to try and use arm as much as possible as long as not pushing, pulling, lifting, and no resistive elbow extension.I printed out and gave pt the instructions from Ainsley Spinner, PA-C last note as to the restrictions on her RUE. Pt to D/C today, we will D/C from acute OT.   Recommendations for follow up therapy are one component of a multi-disciplinary discharge planning process, led by the attending physician.  Recommendations may be updated based on patient status, additional functional criteria and insurance authorization.    Follow Up Recommendations  Outpatient OT    Assistance Recommended at Discharge Intermittent Supervision/Assistance  Patient can return home with the following  A little help with bathing/dressing/bathroom;Assistance with feeding;Assistance with cooking/housework;Assist for transportation   Equipment Recommendations  Tub/shower seat       Precautions / Restrictions Precautions Precautions: Fall Precaution Comments: unstricted ROM per op note--no resistive elbow extension Required Braces or Orthoses: Sling Restrictions Weight Bearing Restrictions: Yes RUE Weight Bearing: Non weight bearing Other Position/Activity  Restrictions: Per PA note 6/3 pt to wear sling when up and about, can have off in bed, wear splint in bed at night.       Mobility Bed Mobility               General bed mobility comments: pt up in recliner upon my arrival    Transfers Overall transfer level: Independent                       Balance Overall balance assessment: Mild deficits observed, not formally tested                                         ADL either performed or assessed with clinical judgement   ADL Overall ADL's : Needs assistance/impaired                 Upper Body Dressing : Maximal assistance;Sitting   Lower Body Dressing: Moderate assistance Lower Body Dressing Details (indicate cue type and reason): independent sit<>stand                    Extremity/Trunk Assessment Upper Extremity Assessment RUE Deficits / Details: increased AROM today for all joints all motions; ROM restriction due to edema today (this was not present in hand yesterday, but could not see rest of arm yesterday due to ace wrapped--that was removed by PA today) and pain RUE Coordination: decreased fine motor;decreased gross motor            Vision Baseline Vision/History: 1 Wears glasses Patient Visual Report: No change from baseline  Cognition Arousal/Alertness: Awake/alert Behavior During Therapy: WFL for tasks assessed/performed Overall Cognitive Status: Within Functional Limits for tasks assessed                                          Exercises Other Exercises Other Exercises: Pt with increased AROM in all joints all directions today but still really limited in all. Increased edema in hand noted today. Educated and gave pt handout on edema control. Also educated and gave pt handout on hand to shoulder exercises (composite finger flex/ext, wrist flex/ext, elbow flex/ext, shoulder flex/ext --lap slides, shoulder abduct/adduction--lateral slides  while seated on bed). Issued pt a blue and yellow squeeze ball (different sizes) and gave her 2 pieces of red tubing to build up utensils and/or toothbrush to encouarge her to continue to try and use RUE more. Did have her attempt to use it for pulling up her LB clothing today.            Pertinent Vitals/ Pain       Pain Assessment Pain Assessment: 0-10 Pain Score: 5  Pain Location: elbow Pain Descriptors / Indicators: Sore Pain Intervention(s): Limited activity within patient's tolerance, Monitored during session, Repositioned         Frequency  Min 2X/week        Progress Toward Goals  OT Goals(current goals can now be found in the care plan section)  Progress towards OT goals: Goals met and updated - see care plan  Acute Rehab OT Goals Patient Stated Goal: home today OT Goal Formulation: With patient Time For Goal Achievement: 07/25/21 Potential to Achieve Goals: Good  Plan Discharge plan remains appropriate       AM-PAC OT "6 Clicks" Daily Activity     Outcome Measure   Help from another person eating meals?: A Little Help from another person taking care of personal grooming?: A Little Help from another person toileting, which includes using toliet, bedpan, or urinal?: A Little Help from another person bathing (including washing, rinsing, drying)?: A Lot Help from another person to put on and taking off regular upper body clothing?: A Lot Help from another person to put on and taking off regular lower body clothing?: A Lot 6 Click Score: 15    End of Session Equipment Utilized During Treatment:  (sling for RUE)  OT Visit Diagnosis: Muscle weakness (generalized) (M62.81);Pain Pain - Right/Left: Right Pain - part of body: Arm   Activity Tolerance Patient tolerated treatment well   Patient Left  (in wheelchair)   Nurse Communication  (Pt ready to go and in wheelchair)        Time: 3710-6269 OT Time Calculation (min): 25 min  Charges: OT General  Charges $OT Visit: 1 Visit  [place], OTR/L Acute Rehab Services [facility] [phone] Office [phone]    Almon Register 07/13/2021, 12:50 PM

## 2021-07-13 NOTE — Progress Notes (Signed)
Orthopaedic Trauma Service Progress Note  Patient ID: Erika Perry MRN: [id number] DOB/AGE: Jul 28, 1950 71 y.o.  Subjective:  Doing great  Ready to go home  Pain well controlled   ROS As above  Objective:   VITALS:   Vitals:   07/12/21 1624 07/12/21 1930 07/13/21 0450 07/13/21 0910  BP: 114/71 (!) 119/55 (!) 122/55 130/65  Pulse: 78 83 69 90  Resp: 16 14 16 18   Temp: 98.9 F (37.2 C) 97.8 F (36.6 C) 97.8 F (36.6 C) 98.1 F (36.7 C)  TempSrc: Oral Oral Oral Oral  SpO2: 97% 92% 92% 95%  Weight:      Height:        Estimated body mass index is 35.32 kg/m as calculated from the following:   Height as of this encounter: 5\' 2"  (1.575 m).   Weight as of this encounter: 87.6 kg.   Intake/Output      06/03 0701 06/04 0700 06/04 0701 06/05 0700   I.V. (mL/kg)     IV Piggyback     Total Intake(mL/kg)     Blood     Total Output     Net          Urine Occurrence 2 x      LABS  Results for orders placed or performed during the hospital encounter of 07/11/21 (from the past 24 hour(s))  Basic metabolic panel     Status: Abnormal   Collection Time: 07/13/21  2:29 AM  Result Value Ref Range   Sodium 137 135 - 145 mmol/L   Potassium 4.6 3.5 - 5.1 mmol/L   Chloride 101 98 - 111 mmol/L   CO2 31 22 - 32 mmol/L   Glucose, Bld 143 (H) 70 - 99 mg/dL   BUN 18 8 - 23 mg/dL   Creatinine, Ser 09/10/21 0.44 - 1.00 mg/dL   Calcium 8.4 (L) 8.9 - 10.3 mg/dL   GFR, Estimated 09/12/21 3.38 mL/min   Anion gap 5 5 - 15  CBC     Status: Abnormal   Collection Time: 07/13/21  2:29 AM  Result Value Ref Range   WBC 7.1 4.0 - 10.5 K/uL   RBC 3.50 (L) 3.87 - 5.11 MIL/uL   Hemoglobin 10.3 (L) 12.0 - 15.0 g/dL   HCT >05 (L) 09/12/21 - 39.7 %   MCV 88.9 80.0 - 100.0 fL   MCH 29.4 26.0 - 34.0 pg   MCHC 33.1 30.0 - 36.0 g/dL   RDW 67.3 41.9 - 37.9 %   Platelets 178 150 - 400 K/uL   nRBC 0.0 0.0 - 0.2 %  VITAMIN D 25  Hydroxy (Vit-D Deficiency, Fractures)     Status: Abnormal   Collection Time: 07/13/21  2:29 AM  Result Value Ref Range   Vit D, 25-Hydroxy 11.55 (L) 30 - 100 ng/mL     PHYSICAL EXAM:   Gen: Sitting on EOB, ready to go home.  Awake and alert, no acute distress, very pleasant.  son at bedside Lungs: Unlabored Cardiac: Regular Ext:       Right upper extremity             Sling is in place             Compressive dressing in place  dressings removed    Incision look great   Scant drainage, sanguinous    No signs of infection  Radial, ulnar, median motor and sensory functions intact AIN and PIN motor intact                          Mild swelling distally but expected degree of swelling             Extremity is warm             + DP pulse             Compartments are soft             No pain with passive stretching of digits or wrist  Assessment/Plan: 2 Days Post-Op   Principal Problem:   Closed fracture of right distal humerus Active Problems:   Hypertension   Anti-infectives (From admission, onward)    Start     Dose/Rate Route Frequency Ordered Stop   07/11/21 1400  ceFAZolin (ANCEF) IVPB 2g/100 mL premix        2 g 200 mL/hr over 30 Minutes Intravenous Every 8 hours 07/11/21 1233 07/12/21 0604   07/11/21 0824  vancomycin (VANCOCIN) powder  Status:  Discontinued          As needed 07/11/21 0824 07/11/21 0958   07/11/21 0600  ceFAZolin (ANCEF) IVPB 2g/100 mL premix        2 g 200 mL/hr over 30 Minutes Intravenous On call to O.R. 07/11/21 7893 07/11/21 0744     .  POD/HD#: 44  71 year old right-hand-dominant female fall at work with right distal humerus fracture s/p ORIF R distal humerus and olecranon osteotomy    -R distal humerus fracture s/p ORIF              NWB R UEx                         No pushing or pulling                         No active elbow extension against resistance             Unrestricted lumbar range of motion otherwise              No range of motion restrictions to shoulder, forearm wrist and hand             Sling can be off when in bed on when mobilizing             Orthoplast splint at nighttime only             Ice and elevate for swelling and pain control             Dressing changed today    Change again in 48 hours    Ok to leave open to air if no drainage o/w 4x4s and tape or mepilex    - Pain management:             Multimodal   - ABL anemia/Hemodynamics             Stable   - Medical issues              Hypertension  BP is well controlled   - DVT/PE prophylaxis:             Low-dose aspirin.  Does not require additional pharmacologic's as this is an upper extremity injury and patient is mobile - ID:              Perioperative antibiotics completed   - Metabolic Bone Disease:             vitamin d deficiency---> 11.55 ng/mL   Supplement              Given age patient should start to get routine DEXA scans - Activity:             Up with assistance - FEN/GI prophylaxis/Foley/Lines:             Regular diet   - Impediments to fracture healing:             Low energy fracture             Age  Vitamin d deficiency   - Dispo:             Therapy evaluations             Home tomorrow             Follow-up with orthopedics in 10 to 14 days   Mearl LatinKeith W. Cady Hafen, PA-C [phone] (C) 07/13/2021, 10:58 AM  [facility] [address] [place] [address] [phone] Collier Bullock(O) [phone] (F)    After 5pm and on the weekends please log on to Amion, go to orthopaedics and the look under the [facility] Call for the provider(s) on call. You can also call our office at [phone] and then follow the prompts to be connected to the call team.   Patient ID: Erika Perry, female   DOB: 17-Nov-1950, 71 y.o.   MRN: [id number]

## 2021-07-13 NOTE — Discharge Summary (Signed)
Orthopaedic Trauma Service (OTS) Discharge Summary   Patient ID: Erika Perry MRN: [id number] DOB/AGE: [birth year] 71 y.o.  Admit date: 07/11/2021 Discharge date: 07/13/2021  Admission Diagnoses: Closed Right distal humerus fracture  HTN  Discharge Diagnoses:  Principal Problem:   Closed fracture of right distal humerus Active Problems:   Hypertension   BMI 35.0-35.9,adult   Vitamin D deficiency   Past Medical History:  Diagnosis Date   BMI 35.0-35.9,adult 07/17/2020   Closed fracture of right distal humerus 07/12/2021   HLD (hyperlipidemia)    Hypertension    Pre-diabetes    Vitamin D deficiency 07/13/2021     Procedures Performed: 07/11/2021- Dr. Doreatha Martin   CPT 24546-Open reduction internal fixation of right supracondylar distal humerus fracture CPT 25360-Right olecranon osteotomy    Discharged Condition: good  Hospital Course:   71 year old right-hand-dominant female sustained a fall at work on [employer] with resultant right distal humerus fracture.  Patient was seen and evaluated at urgent care and then was subsequently referred to orthopedic trauma specialist for definitive management.  Patient was seen and evaluated in the office and surgery was scheduled for 07/11/2021.  Patient tolerated the procedures noted above.  Patient did have a preoperative block.  Patient tolerated surgery well.  On postoperative day #1 she was doing fair but having some difficulty mobilizing.  She did not feel comfortable returning home on postoperative day #1.  Given the surgery that she had performed felt that this was completely reasonable.  She worked with therapies on postoperative day #1 and did very well.  No major issues were noted during postop day #1 and on postop day #2 she was deemed stable for discharge home with the assistance of her family.  Dressing changes performed on postoperative day #2 prior to discharge.  Her wound was stable.  She did have fairly moderate swelling to  her hand but nothing out of the ordinary for her injury.  Interventions were discussed with patient and family regarding swelling control.  New Mepilex dressing was applied to her incision.  Reviewed wound care with patient and family.  At the time of discharge patient was tolerating a regular diet and voiding without difficulty passing gas.  No abdominal pain no other complaints or concerns.  Block is worn off prior to discharge and she is getting adequate pain control with Tylenol and oxycodone.  No other issues noted.  She did complete Ancef for perioperative antibiosis.  Of note she is cephalosporin tolerant despite having an allergy listed to penicillin.  Patient discharged in stable condition on 07/13/2021.  She will schedule follow-up visit in the next 10 to 14 days to the office for suture removal and follow-up x-rays.  She has unrestricted range of motion of her right elbow forearm wrist and hand however she will refrain from resisted active extension of her right elbow.  Sling can be off when she is in bed or chair and would recommend her keeping her sling on when mobilizing and ambulating.  She is to wear her Orthoplast splint at night only.  Consults: None  Significant Diagnostic Studies: labs:    Latest Reference Range & Units 07/12/21 02:58 07/13/21 02:29  Sodium 135 - 145 mmol/L 135 137  Potassium 3.5 - 5.1 mmol/L 4.7 4.6  Chloride 98 - 111 mmol/L 101 101  CO2 22 - 32 mmol/L 26 31  Glucose 70 - 99 mg/dL 174 (H) 143 (H)  BUN 8 - 23 mg/dL 20 18  Creatinine 0.44 - 1.00  mg/dL 0.70 0.61  Calcium 8.9 - 10.3 mg/dL 8.6 (L) 8.4 (L)  Anion gap 5 - _0 GFR, Estimated >60 mL/min >60 >60  Vitamin D, 25-Hydroxy 30 - 100 ng/mL  11.55 (L)  WBC 4.0 - 10.5 K/uL 8.8 7.1  RBC 3.87 - 5.11 MIL/uL 3.58 (L) 3.50 (L)  Hemoglobin 12.0 - 15.0 g/dL 10.5 (L) 10.3 (L)  HCT 36.0 - 46.0 % 31.4 (L) 31.1 (L)  MCV 80.0 - 100.0 fL 87.7 88.9  MCH 26.0 - 34.0 pg 29.3 29.4  MCHC 30.0 - 36.0 g/dL 33.4 33.1  RDW  11.5 - 15.5 % 14.9 15.4  Platelets 150 - 400 K/uL 196 178  nRBC 0.0 - 0.2 % 0.0 0.0  (H): Data is abnormally high (L): Data is abnormally low  Treatments: IV hydration, antibiotics: Ancef, analgesia: acetaminophen, Dilaudid, and oxycodone, anticoagulation: ASA, therapies: PT, OT, and RN, and surgery: as above   Discharge Exam:        Orthopaedic Trauma Service Progress Note   Patient ID: Erika Perry MRN: [id number] DOB/AGE: 1950/02/15 71 y.o.   Subjective:   Doing great  Ready to go home  Pain well controlled     ROS As above   Objective:    VITALS:         Vitals:    07/12/21 1624 07/12/21 1930 07/13/21 0450 07/13/21 0910  BP: 114/71 (!) 119/55 (!) 122/55 130/65  Pulse: 78 83 69 90  Resp: _1 Temp: 98.9 F (37.2 C) 97.8 F (36.6 C) 97.8 F (36.6 C) 98.1 F (36.7 C)  TempSrc: Oral Oral Oral Oral  SpO2: 97% 92% 92% 95%  Weight:          Height:              Estimated body mass index is 35.32 kg/m as calculated from the following:   Height as of this encounter: 5' 2" (1.575 m).   Weight as of this encounter: 87.6 kg.     Intake/Output      06/03 0701 06/04 0700 06/04 0701 06/05 0700   I.V. (mL/kg)     IV Piggyback     Total Intake(mL/kg)     Blood     Total Output     Net          Urine Occurrence 2 x       LABS   Lab Results Last 24 Hours       Results for orders placed or performed during the hospital encounter of 07/11/21 (from the past 24 hour(s))  Basic metabolic panel     Status: Abnormal    Collection Time: 07/13/21  2:29 AM  Result Value Ref Range    Sodium 137 135 - 145 mmol/L    Potassium 4.6 3.5 - 5.1 mmol/L    Chloride 101 98 - 111 mmol/L    CO2 31 22 - 32 mmol/L    Glucose, Bld 143 (H) 70 - 99 mg/dL    BUN 18 8 - 23 mg/dL    Creatinine, Ser 0.61 0.44 - 1.00 mg/dL    Calcium 8.4 (L) 8.9 - 10.3 mg/dL    GFR, Estimated >60 >60 mL/min    Anion gap 5 5 - 15  CBC     Status: Abnormal    Collection Time: 07/13/21   2:29 AM  Result Value Ref Range    WBC 7.1 4.0 - 10.5 K/uL    RBC 3.50 (  L) 3.87 - 5.11 MIL/uL    Hemoglobin 10.3 (L) 12.0 - 15.0 g/dL    HCT 31.1 (L) 36.0 - 46.0 %    MCV 88.9 80.0 - 100.0 fL    MCH 29.4 26.0 - 34.0 pg    MCHC 33.1 30.0 - 36.0 g/dL    RDW 15.4 11.5 - 15.5 %    Platelets 178 150 - 400 K/uL    nRBC 0.0 0.0 - 0.2 %  VITAMIN D 25 Hydroxy (Vit-D Deficiency, Fractures)     Status: Abnormal    Collection Time: 07/13/21  2:29 AM  Result Value Ref Range    Vit D, 25-Hydroxy 11.55 (L) 30 - 100 ng/mL          PHYSICAL EXAM:    Gen: Sitting on EOB, ready to go home.  Awake and alert, no acute distress, very pleasant.  son at bedside Lungs: Unlabored Cardiac: Regular Ext:       Right upper extremity             Sling is in place             Compressive dressing in place             dressings removed                          Incision look great                         Scant drainage, sanguinous                          No signs of infection  Radial, ulnar, median motor and sensory functions intact AIN and PIN motor intact                          Mild swelling distally but expected degree of swelling             Extremity is warm             + DP pulse             Compartments are soft             No pain with passive stretching of digits or wrist   Assessment/Plan: 2 Days Post-Op    Principal Problem:   Closed fracture of right distal humerus Active Problems:   Hypertension     Anti-infectives (From admission, onward)        Start     Dose/Rate Route Frequency Ordered Stop    07/11/21 1400   ceFAZolin (ANCEF) IVPB 2g/100 mL premix        2 g 200 mL/hr over 30 Minutes Intravenous Every 8 hours 07/11/21 1233 07/12/21 0604    07/11/21 0824   vancomycin (VANCOCIN) powder  Status:  Discontinued            As needed 07/11/21 0824 07/11/21 0958    07/11/21 0600   ceFAZolin (ANCEF) IVPB 2g/100 mL premix        2 g 200 mL/hr over 30 Minutes Intravenous On call  to O.R. 07/11/21 3295 07/11/21 0744         .   POD/HD#: 71   71 year old right-hand-dominant female fall at work with right distal humerus fracture s/p ORIF R distal humerus and olecranon osteotomy    -  R distal humerus fracture s/p ORIF              NWB R UEx                         No pushing or pulling                         No active elbow extension against resistance             Unrestricted lumbar range of motion otherwise             No range of motion restrictions to shoulder, forearm wrist and hand             Sling can be off when in bed on when mobilizing             Orthoplast splint at nighttime only             Ice and elevate for swelling and pain control             Dressing changed today                          Change again in 48 hours                          Ok to leave open to air if no drainage o/w 4x4s and tape or mepilex    - Pain management:             Multimodal   - ABL anemia/Hemodynamics             Stable   - Medical issues              Hypertension                         BP is well controlled   - DVT/PE prophylaxis:             Low-dose aspirin.  Does not require additional pharmacologic's as this is an upper extremity injury and patient is mobile - ID:              Perioperative antibiotics completed    - Metabolic Bone Disease:             vitamin d deficiency---> 11.55 ng/mL                         Supplement              Given age patient should start to get routine DEXA scans - Activity:             Up with assistance - FEN/GI prophylaxis/Foley/Lines:             Regular diet   - Impediments to fracture healing:             Low energy fracture             Age             Vitamin d deficiency    - Dispo:             Therapy evaluations             Home tomorrow  Follow-up with orthopedics in 10 to 14 days  Disposition: Discharge disposition: 01-Home or Self Care        Allergies as of 07/13/2021        Reactions   Latex    Pt states that she is allergic to latex in pre-op.  Regardless of allergy type latex will not be used.    Bactrim [sulfamethoxazole-trimethoprim]    Nausea, no appitite   Penicillins    Other reaction(s): Nausea/Vomiting ()        Medication List     STOP taking these medications    clindamycin 300 MG capsule Commonly known as: CLEOCIN   HYDROcodone-acetaminophen 5-325 MG tablet Commonly known as: NORCO/VICODIN   ibuprofen 800 MG tablet Commonly known as: ADVIL       TAKE these medications    aspirin EC 81 MG tablet Take 1 tablet (81 mg total) by mouth daily. Swallow whole.   Docusate Sodium 100 MG capsule Take 100 mg by mouth 2 (two) times daily.   hydrochlorothiazide 12.5 MG capsule Commonly known as: Microzide Take 1 capsule (12.5 mg total) by mouth daily.   losartan 50 MG tablet Commonly known as: COZAAR Take 1 tablet (50 mg total) by mouth daily.   methocarbamol 500 MG tablet Commonly known as: ROBAXIN Take 500 mg by mouth 4 (four) times daily as needed.   naloxone 4 MG/0.1ML Liqd nasal spray kit Commonly known as: NARCAN Place 4 mg into the nose as needed for opioid reversal.   oxyCODONE-acetaminophen 5-325 MG tablet Commonly known as: PERCOCET/ROXICET Take 1 tablet by mouth every 6 (six) hours as needed for pain.   rosuvastatin 20 MG tablet Commonly known as: Crestor Take 1 tablet (20 mg total) by mouth daily.   Vitamin D (Ergocalciferol) 1.25 MG (50000 UNIT) Caps capsule Commonly known as: DRISDOL Take 1 capsule (50,000 Units total) by mouth every 7 (seven) days.   Vitamin D 125 MCG (5000 UT) Caps Take 1 capsule by mouth daily.               Durable Medical Equipment  (From admission, onward)           Start     Ordered   07/12/21 1111  For home use only DME Shower stool  Once        07/12/21 1110            Follow-up Information     Haddix, Thomasene Lot, MD. Schedule an appointment as soon as  possible for a visit in 2 week(s).   Specialty: Orthopedic Surgery Why: for wound check, repeat x-rays, suture removal Contact information: [place] [address] [phone]                 Discharge Instructions and Plan:  71 year old right-hand-dominant female fall at work with right distal humerus fracture s/p ORIF R distal humerus and olecranon osteotomy   Weightbearing: NWB RUE ROM: unrestricted ROM R elbow but no active extension against resistance  Insicional and dressing care: Daily dressing changes with 4x4 gauze and tape or silicone foam dressing (mepilex) Orthopedic device(s): orthoplast splint at night. Sling on when up ambulating  Showering:  ok to shower and clean wound with soap and water only once wounds stop draining VTE prophylaxis: Aspirin 49m qd x 4 weeks Pain control: multimodal: percocet and robaxin Bone Health/Optimization: labs show vitamin d deficiency and fracture is consistent with fragility fracture.  Supplement with vitamin d2 50,000 IUs x 4 weeks  and vitamin d3 5000 IUs daily until further notice. Recheck labs in 3 months. Will need DEXA in 4-8 weeks as well to work up suspected osteoporosis  Follow - up plan: 2 weeks Contact information:  Katha Hamming MD, Rushie Nyhan PA-C   Signed:  Jari Pigg, PA-C [phone] (C) 07/13/2021, 11:08 AM  [facility] [place] [id number] [phone] Domingo Sep (F)

## 2021-07-14 ENCOUNTER — Encounter (HOSPITAL_COMMUNITY): Payer: Self-pay | Admitting: Student

## 2021-07-15 ENCOUNTER — Encounter (HOSPITAL_COMMUNITY): Payer: Self-pay | Admitting: Student

## 2021-08-07 ENCOUNTER — Ambulatory Visit (INDEPENDENT_AMBULATORY_CARE_PROVIDER_SITE_OTHER): Payer: Medicare Other

## 2021-08-07 VITALS — Ht 62.0 in | Wt 193.0 lb

## 2021-08-07 DIAGNOSIS — Z0000 Encounter for general adult medical examination without abnormal findings: Secondary | ICD-10-CM

## 2021-08-07 NOTE — Progress Notes (Signed)
Subjective:   Erika Perry is a 71 y.o. female who presents for an Initial Medicare Annual Wellness Visit.  Review of Systems    No ROS.  Medicare Wellness Virtual Visit.  Visual/audio telehealth visit, UTA vital signs.   See social history for additional risk factors.   Cardiac Risk Factors include: advanced age (>8men, >66 women)     Objective:    Today's Vitals   08/07/21 0842  Weight: 193 lb (87.5 kg)  Height: 5\' 2"  (1.575 m)   Body mass index is 35.3 kg/m.     08/07/2021    8:46 AM 07/11/2021    6:29 AM 02/28/2016    3:57 PM  Advanced Directives  Does Patient Have a Medical Advance Directive? No No No  Would patient like information on creating a medical advance directive? No - Patient declined No - Patient declined     Current Medications (verified) Outpatient Encounter Medications as of 08/07/2021  Medication Sig   aspirin EC 81 MG tablet Take 1 tablet (81 mg total) by mouth daily. Swallow whole.   Cholecalciferol (VITAMIN D) 125 MCG (5000 UT) CAPS Take 1 capsule by mouth daily.   Docusate Sodium 100 MG capsule Take 100 mg by mouth 2 (two) times daily.   hydrochlorothiazide (MICROZIDE) 12.5 MG capsule Take 1 capsule (12.5 mg total) by mouth daily.   losartan (COZAAR) 50 MG tablet Take 1 tablet (50 mg total) by mouth daily.   methocarbamol (ROBAXIN) 500 MG tablet Take 500 mg by mouth 4 (four) times daily as needed.   naloxone (NARCAN) nasal spray 4 mg/0.1 mL Place 4 mg into the nose as needed for opioid reversal.   oxyCODONE-acetaminophen (PERCOCET/ROXICET) 5-325 MG tablet Take 1 tablet by mouth every 6 (six) hours as needed for pain.   rosuvastatin (CRESTOR) 20 MG tablet Take 1 tablet (20 mg total) by mouth daily.   Vitamin D, Ergocalciferol, (DRISDOL) 1.25 MG (50000 UNIT) CAPS capsule Take 1 capsule (50,000 Units total) by mouth every 7 (seven) days.   [DISCONTINUED] atorvastatin (LIPITOR) 10 MG tablet Take 1 tablet (10 mg total) by mouth daily.    [DISCONTINUED] metFORMIN (GLUCOPHAGE) 500 MG tablet Take 1 tablet (500 mg total) by mouth 2 (two) times daily with a meal.   No facility-administered encounter medications on file as of 08/07/2021.    Allergies (verified) Latex, Bactrim [sulfamethoxazole-trimethoprim], and Penicillins   History: Past Medical History:  Diagnosis Date   BMI 35.0-35.9,adult 07/17/2020   Closed fracture of right distal humerus 07/12/2021   HLD (hyperlipidemia)    Hypertension    Pre-diabetes    Vitamin D deficiency 07/13/2021   Past Surgical History:  Procedure Laterality Date   CESAREAN SECTION  1981, 1990   ORIF HUMERUS FRACTURE Right 07/11/2021   Procedure: OPEN REDUCTION INTERNAL FIXATION (ORIF) DISTAL HUMERUS FRACTURE;  Surgeon: 09/10/2021, MD;  Location: [facility] OR;  Service: Orthopedics;  Laterality: Right;   Family History  Problem Relation Age of Onset   Cancer Mother    Diabetes Mother    Hypertension Mother    Other Father        unknown medical history   Social History   Socioeconomic History   Marital status: Widowed    Spouse name: Not on file   Number of children: Not on file   Years of education: Not on file   Highest education level: Not on file  Occupational History   Not on file  Tobacco Use   Smoking status:  Never   Smokeless tobacco: Never  Vaping Use   Vaping Use: Never used  Substance and Sexual Activity   Alcohol use: No   Drug use: Never   Sexual activity: Not Currently  Other Topics Concern   Not on file  Social History Narrative   Not on file   Social Determinants of Health   Financial Resource Strain: Low Risk  (08/07/2021)   Overall Financial Resource Strain (CARDIA)    Difficulty of Paying Living Expenses: Not hard at all  Food Insecurity: No Food Insecurity (08/07/2021)   Hunger Vital Sign    Worried About Running Out of Food in the Last Year: Never true    Ran Out of Food in the Last Year: Never true  Transportation Needs: No Transportation Needs  (08/07/2021)   PRAPARE - Administrator, Civil Service (Medical): No    Lack of Transportation (Non-Medical): No  Physical Activity: Not on file  Stress: No Stress Concern Present (08/07/2021)   [employer] of Occupational Health - Occupational Stress Questionnaire    Feeling of Stress : Not at all  Social Connections: Unknown (08/07/2021)   Social Connection and Isolation Panel [NHANES]    Frequency of Communication with Friends and Family: More than three times a week    Frequency of Social Gatherings with Friends and Family: More than three times a week    Attends Religious Services: Not on file    Active Member of Clubs or Organizations: Not on file    Attends Banker Meetings: Not on file    Marital Status: Widowed    Tobacco Counseling Counseling given: Not Answered   Clinical Intake:  Pre-visit preparation completed: Yes        Diabetes: No  How often do you need to have someone help you when you read instructions, pamphlets, or other written materials from your doctor or pharmacy?: 1 - Never  Interpreter Needed?: No      Activities of Daily Living    08/07/2021    8:47 AM 07/11/2021    6:29 AM  In your present state of health, do you have any difficulty performing the following activities:  Hearing? 0   Vision? 0   Difficulty concentrating or making decisions? 0   Walking or climbing stairs? 0   Dressing or bathing? 0   Doing errands, shopping? 0 0  Preparing Food and eating ? N   Using the Toilet? N   In the past six months, have you accidently leaked urine? N   Do you have problems with loss of bowel control? N   Managing your Medications? N   Managing your Finances? N   Housekeeping or managing your Housekeeping? N     Patient Care Team: Glori Luis, MD as PCP - General (Family Medicine)  Indicate any recent Medical Services you may have received from other than [facility] providers in the past year (date may be  approximate).     Assessment:   This is a routine wellness examination for [name].  Virtual Visit via Telephone Note  I connected with  Erika Perry on 08/07/21 at  8:15 AM EDT by telephone and verified that I am speaking with the correct person using two identifiers.  Persons participating in the virtual visit: patient/Nurse Health Advisor   I discussed the limitations of performing an evaluation and management service by telehealth. We continued and completed visit with audio only. Some vital signs may be absent or patient  reported.   Hearing/Vision screen Hearing Screening - Comments:: Patient is able to hear conversational tones without difficulty.  No issues reported. Vision Screening - Comments:: Wears corrective lenses when reading They have seen their ophthalmologist in the last 12 months.    Dietary issues and exercise activities discussed: Current Exercise Habits: Home exercise routine, Time (Minutes): 60, Frequency (Times/Week): 2, Weekly Exercise (Minutes/Week): 120, Intensity: Mild   Goals Addressed             This Visit's Progress    Maintain Healthy Lifestyle       Stay active, as tolerated Healthy diet       Depression Screen    08/07/2021    8:44 AM 06/16/2021   11:34 AM 04/09/2021    2:36 PM 12/24/2020    1:25 PM 07/17/2020    1:32 PM 04/16/2020    9:15 AM 07/18/2018   10:28 AM  PHQ 2/9 Scores  PHQ - 2 Score 0 0 0 0 0 0 0  PHQ- 9 Score      0 0    Fall Risk    08/07/2021    8:47 AM 06/16/2021   11:34 AM 04/09/2021    2:36 PM 12/24/2020    1:25 PM 07/17/2020    1:32 PM  Fall Risk   Falls in the past year? 1 0 0 0 0  Number falls in past yr: 0 0  0 0  Injury with Fall?  0  0 0  Risk for fall due to :  No Fall Risks No Fall Risks    Follow up Falls evaluation completed Falls evaluation completed Falls evaluation completed Falls evaluation completed Falls evaluation completed    FALL RISK PREVENTION PERTAINING TO THE HOME: Home free of loose throw  rugs in walkways, pet beds, electrical cords, etc? Yes  Adequate lighting in your home to reduce risk of falls? Yes   ASSISTIVE DEVICES UTILIZED TO PREVENT FALLS: Life alert? No  Use of a cane, walker or w/c? No   TIMED UP AND GO: Was the test performed? No .   Cognitive Function:  Patient is alert and oriented x3.       Immunizations  There is no immunization history on file for this patient.  Tdap vaccine- discontinued per patient.   Shingrix vaccine- discontinued per patient.   Screening Tests Health Maintenance  Topic Date Due   FOOT EXAM  08/09/2021 (Originally 04/16/2021)   MAMMOGRAM  10/10/2021 (Originally 06/28/2001)   DEXA SCAN  10/10/2021 (Originally 06/28/2016)   Pneumonia Vaccine 10+ Years old (1 - PCV) 12/24/2021 (Originally 06/28/2016)   OPHTHALMOLOGY EXAM  08/22/2021   INFLUENZA VACCINE  09/09/2021   HEMOGLOBIN A1C  12/17/2021   Hepatitis C Screening  Completed   HPV VACCINES  Aged Out   COLONOSCOPY (Pts 45-33yrs Insurance coverage will need to be confirmed)  Discontinued   TETANUS/TDAP  Discontinued   COVID-19 Vaccine  Discontinued   Zoster Vaccines- Shingrix  Discontinued   Health Maintenance There are no preventive care reminders to display for this patient.  Colonoscopy- discontinued per patient.   Mammogram- deferred per patient.   Bone density- deferred per patient.   Foot exam- deferred per patient.   Lung Cancer Screening: (Low Dose CT Chest recommended if Age 95-80 years, 30 pack-year currently smoking OR have quit w/in 15years.) does not qualify.   Vision Screening: Recommended annual ophthalmology exams for early detection of glaucoma and other disorders of the eye.  Dental Screening: Recommended annual  dental exams for proper oral hygiene  Community Resource Referral / Chronic Care Management: CRR required this visit?  No   CCM required this visit?  No      Plan:   Keep all routine maintenance appointments.   I have personally  reviewed and noted the following in the patient's chart:   Medical and social history Use of alcohol, tobacco or illicit drugs  Current medications and supplements including opioid prescriptions. Patient is currently taking opioid prescriptions. Information provided to patient regarding non-opioid alternatives. Patient advised to discuss non-opioid treatment plan with their provider. Followed by Ortho.  Functional ability and status Nutritional status Physical activity Advanced directives List of other physicians Hospitalizations, surgeries, and ER visits in previous 12 months Vitals Screenings to include cognitive, depression, and falls Referrals and appointments  In addition, I have reviewed and discussed with patient certain preventive protocols, quality metrics, and best practice recommendations. A written personalized care plan for preventive services as well as general preventive health recommendations were provided to patient.     Ashok Pall, LPN   03/05/[id number]

## 2021-08-07 NOTE — Patient Instructions (Addendum)
Erika Perry , Thank you for taking time to come for your Medicare Wellness Visit. I appreciate your ongoing commitment to your health goals. Please review the following plan we discussed and let me know if I can assist you in the future.   These are the goals we discussed:  Goals      Maintain Healthy Lifestyle     Stay active, as tolerated Healthy diet        This is a list of the screening recommended for you and due dates:  Health Maintenance  Topic Date Due   Complete foot exam   08/09/2021*   Mammogram  10/10/2021*   DEXA scan (bone density measurement)  10/10/2021*   Pneumonia Vaccine (1 - PCV) 12/24/2021*   Eye exam for diabetics  08/22/2021   Flu Shot  09/09/2021   Hemoglobin A1C  12/17/2021   Hepatitis C Screening: USPSTF Recommendation to screen - Ages 18-79 yo.  Completed   HPV Vaccine  Aged Out   Colon Cancer Screening  Discontinued   Tetanus Vaccine  Discontinued   COVID-19 Vaccine  Discontinued   Zoster (Shingles) Vaccine  Discontinued  *Topic was postponed. The date shown is not the original due date.   Opioid Pain Medicine Management Opioids are powerful medicines that are used to treat moderate to severe pain. When used for short periods of time, they can help you to: Sleep better. Do better in physical or occupational therapy. Feel better in the first few days after an injury. Recover from surgery. Opioids should be taken with the supervision of a trained health care provider. They should be taken for the shortest period of time possible. This is because opioids can be addictive, and the longer you take opioids, the greater your risk of addiction. This addiction can also be called opioid use disorder. What are the risks? Using opioid pain medicines for longer than 3 days increases your risk of side effects. Side effects include: Constipation. Nausea and vomiting. Breathing difficulties (respiratory depression). Drowsiness. Confusion. Opioid use  disorder. Itching. Taking opioid pain medicine for a long period of time can affect your ability to do daily tasks. It also puts you at risk for: Motor vehicle crashes. Depression. Suicide. Heart attack. Overdose, which can be life-threatening. What is a pain treatment plan? A pain treatment plan is an agreement between you and your health care provider. Pain is unique to each person, and treatments vary depending on your condition. To manage your pain, you and your health care provider need to work together. To help you do this: Discuss the goals of your treatment, including how much pain you might expect to have and how you will manage the pain. Review the risks and benefits of taking opioid medicines. Remember that a good treatment plan uses more than one approach and minimizes the chance of side effects. Be honest about the amount of medicines you take and about any drug or alcohol use. Get pain medicine prescriptions from only one health care provider. Pain can be managed with many types of alternative treatments. Ask your health care provider to refer you to one or more specialists who can help you manage pain through: Physical or occupational therapy. Counseling (cognitive behavioral therapy). Good nutrition. Biofeedback. Massage. Meditation. Non-opioid medicine. Following a gentle exercise program. How to use opioid pain medicine Taking medicine Take your pain medicine exactly as told by your health care provider. Take it only when you need it. If your pain gets less severe, you may  take less than your prescribed dose if your health care provider approves. If you are not having pain, do nottake pain medicine unless your health care provider tells you to take it. If your pain is severe, do nottry to treat it yourself by taking more pills than instructed on your prescription. Contact your health care provider for help. Write down the times when you take your pain medicine. It is  easy to become confused while on pain medicine. Writing the time can help you avoid overdose. Take other over-the-counter or prescription medicines only as told by your health care provider. Keeping yourself and others safe  While you are taking opioid pain medicine: Do not drive, use machinery, or power tools. Do not sign legal documents. Do not drink alcohol. Do not take sleeping pills. Do not supervise children by yourself. Do not do activities that require climbing or being in high places. Do not go to a lake, river, ocean, spa, or swimming pool. Do not share your pain medicine with anyone. Keep pain medicine in a locked cabinet or in a secure area where pets and children cannot reach it. Stopping your use of opioids If you have been taking opioid medicine for more than a few weeks, you may need to slowly decrease (taper) how much you take until you stop completely. Tapering your use of opioids can decrease your risk of symptoms of withdrawal, such as: Pain and cramping in the abdomen. Nausea. Sweating. Sleepiness. Restlessness. Uncontrollable shaking (tremors). Cravings for the medicine. Do not attempt to taper your use of opioids on your own. Talk with your health care provider about how to do this. Your health care provider may prescribe a step-down schedule based on how much medicine you are taking and how long you have been taking it. Getting rid of leftover pills Do not save any leftover pills. Get rid of leftover pills safely by: Taking the medicine to a prescription take-back program. This is usually offered by the county or law enforcement. Bringing them to a pharmacy that has a drug disposal container. Flushing them down the toilet. Check the label or package insert of your medicine to see whether this is safe to do. Throwing them out in the trash. Check the label or package insert of your medicine to see whether this is safe to do. If it is safe to throw it out, remove  the medicine from the original container, put it into a sealable bag or container, and mix it with used coffee grounds, food scraps, dirt, or cat litter before putting it in the trash. Follow these instructions at home: Activity Do exercises as told by your health care provider. Avoid activities that make your pain worse. Return to your normal activities as told by your health care provider. Ask your health care provider what activities are safe for you. General instructions You may need to take these actions to prevent or treat constipation: Drink enough fluid to keep your urine pale yellow. Take over-the-counter or prescription medicines. Eat foods that are high in fiber, such as beans, whole grains, and fresh fruits and vegetables. Limit foods that are high in fat and processed sugars, such as fried or sweet foods. Keep all follow-up visits. This is important. Where to find support If you have been taking opioids for a long time, you may benefit from receiving support for quitting from a local support group or counselor. Ask your health care provider for a referral to these resources in your area. Where to  find more information Centers for Disease Control and Prevention (CDC): FootballExhibition.com.br [place] Food and Drug Administration (FDA): PumpkinSearch.com.ee Get help right away if: You may have taken too much of an opioid (overdosed). Common symptoms of an overdose: Your breathing is slower or more shallow than normal. You have a very slow heartbeat (pulse). You have slurred speech. You have nausea and vomiting. Your pupils become very small. You have other potential symptoms: You are very confused. You faint or feel like you will faint. You have cold, clammy skin. You have blue lips or fingernails. You have thoughts of harming yourself or harming others. These symptoms may represent a serious problem that is an emergency. Do not wait to see if the symptoms will go away. Get medical help right away.  Call your local emergency services (911 in the [place] Do not drive yourself to the hospital.  If you ever feel like you may hurt yourself or others, or have thoughts about taking your own life, get help right away. Go to your nearest emergency department or: Call your local emergency services (911 in the [place] Call the [facility] ((854) 016-2804 in the [place] Call a suicide crisis helpline, such as the National Suicide Prevention Lifeline at 813-397-1997 or 988 in the [place] This is open 24 hours a day in the [place] Text the Crisis Text Line at 606-609-2503 (in the [place] Summary Opioid medicines can help you manage moderate to severe pain for a short period of time. A pain treatment plan is an agreement between you and your health care provider. Discuss the goals of your treatment, including how much pain you might expect to have and how you will manage the pain. If you think that you or someone else may have taken too much of an opioid, get medical help right away. This information is not intended to replace advice given to you by your health care provider. Make sure you discuss any questions you have with your health care provider. Document Revised: 08/21/2020 Document Reviewed: 05/08/2020 Elsevier Patient Education  2023 [employer].

## 2021-08-11 ENCOUNTER — Other Ambulatory Visit (INDEPENDENT_AMBULATORY_CARE_PROVIDER_SITE_OTHER): Payer: Medicare Other

## 2021-08-11 DIAGNOSIS — E785 Hyperlipidemia, unspecified: Secondary | ICD-10-CM

## 2021-08-11 LAB — HEPATIC FUNCTION PANEL
ALT: 9 U/L (ref 0–35)
AST: 11 U/L (ref 0–37)
Albumin: 4.2 g/dL (ref 3.5–5.2)
Alkaline Phosphatase: 73 U/L (ref 39–117)
Bilirubin, Direct: 0.2 mg/dL (ref 0.0–0.3)
Total Bilirubin: 0.9 mg/dL (ref 0.2–1.2)
Total Protein: 6.5 g/dL (ref 6.0–8.3)

## 2021-08-11 LAB — LDL CHOLESTEROL, DIRECT: Direct LDL: 54 mg/dL

## 2021-09-17 ENCOUNTER — Encounter: Payer: Self-pay | Admitting: Family Medicine

## 2021-09-17 ENCOUNTER — Ambulatory Visit (INDEPENDENT_AMBULATORY_CARE_PROVIDER_SITE_OTHER): Payer: Medicare Other | Admitting: Family Medicine

## 2021-09-17 VITALS — BP 128/80 | HR 89 | Temp 98.0°F | Ht 62.0 in | Wt 198.8 lb

## 2021-09-17 DIAGNOSIS — I10 Essential (primary) hypertension: Secondary | ICD-10-CM | POA: Diagnosis not present

## 2021-09-17 DIAGNOSIS — S42401D Unspecified fracture of lower end of right humerus, subsequent encounter for fracture with routine healing: Secondary | ICD-10-CM | POA: Diagnosis not present

## 2021-09-17 DIAGNOSIS — Z6835 Body mass index (BMI) 35.0-35.9, adult: Secondary | ICD-10-CM | POA: Diagnosis not present

## 2021-09-17 MED ORDER — HYDROCHLOROTHIAZIDE 12.5 MG PO CAPS: 12.5000 mg | ORAL_CAPSULE | Freq: Every day | ORAL | 3 refills | Status: DC

## 2021-09-17 MED ORDER — LOSARTAN POTASSIUM 50 MG PO TABS: 50.0000 mg | ORAL_TABLET | Freq: Every day | ORAL | 3 refills | Status: DC

## 2021-09-17 NOTE — Assessment & Plan Note (Signed)
She will continue to see orthopedics.  Discussed the need for bone density scan given her fracture.

## 2021-09-17 NOTE — Progress Notes (Signed)
Marikay Alar, MD Phone: [phone]  Erika Perry is a 71 y.o. female who presents today for follow-up.  Hypertension: Not checking at home.  Taking losartan.  She is been out of HCTZ for 2 weeks.  No chest pain, shortness of breath, or edema.  Obesity: Patient has been eating anything and everything.  She has not been able to exercise much given a humerus fracture.    Fall/humerus fracture: Patient notes she fell on Mother's Day.  She ended up having surgery in June and had an ORIF of her humerus.  She is still doing therapy for this.  She is having some pain with the therapy.  Her surgeon is discussing whether or not to put her back under and work on her elbow again.  Social History   Tobacco Use  Smoking Status Never  Smokeless Tobacco Never    Current Outpatient Medications on File Prior to Visit  Medication Sig Dispense Refill   Cholecalciferol (VITAMIN D) 125 MCG (5000 UT) CAPS Take 1 capsule by mouth daily. 30 capsule 6   Docusate Sodium 100 MG capsule Take 100 mg by mouth 2 (two) times daily.     methocarbamol (ROBAXIN) 500 MG tablet Take 500 mg by mouth 4 (four) times daily as needed.     naloxone (NARCAN) nasal spray 4 mg/0.1 mL Place 4 mg into the nose as needed for opioid reversal.     oxyCODONE-acetaminophen (PERCOCET/ROXICET) 5-325 MG tablet Take 1 tablet by mouth every 6 (six) hours as needed for pain.     rosuvastatin (CRESTOR) 20 MG tablet Take 1 tablet (20 mg total) by mouth daily. 90 tablet 1   Vitamin D, Ergocalciferol, (DRISDOL) 1.25 MG (50000 UNIT) CAPS capsule Take 1 capsule (50,000 Units total) by mouth every 7 (seven) days. 4 capsule 0   [DISCONTINUED] atorvastatin (LIPITOR) 10 MG tablet Take 1 tablet (10 mg total) by mouth daily. 90 tablet 1   [DISCONTINUED] metFORMIN (GLUCOPHAGE) 500 MG tablet Take 1 tablet (500 mg total) by mouth 2 (two) times daily with a meal. 180 tablet 1   No current facility-administered medications on file prior to visit.      ROS see history of present illness  Objective  Physical Exam Vitals:   09/17/21 1354 09/17/21 1429  BP: (!) 150/80 128/80  Pulse: 89   Temp: 98 F (36.7 C)   SpO2: 97%     BP Readings from Last 3 Encounters:  09/17/21 128/80  07/13/21 130/65  06/16/21 (!) 150/70   Wt Readings from Last 3 Encounters:  09/17/21 198 lb 12.8 oz (90.2 kg)  08/07/21 193 lb (87.5 kg)  07/11/21 193 lb 2 oz (87.6 kg)    Physical Exam Constitutional:      General: She is not in acute distress.    Appearance: She is not diaphoretic.  Cardiovascular:     Rate and Rhythm: Normal rate and regular rhythm.     Heart sounds: Normal heart sounds.  Pulmonary:     Effort: Pulmonary effort is normal.     Breath sounds: Normal breath sounds.  Skin:    General: Skin is warm and dry.  Neurological:     Mental Status: She is alert.      Assessment/Plan: Please see individual problem list.  Problem List Items Addressed This Visit     BMI 35.0-35.9,adult (Chronic)    Discussed dietary changes including eating more lean meats and fruits and vegetables and less fatty/sugary foods.  Discussed she would need to  be able to exercise to be able to lose weight and exercise would be difficult until she has improved with her elbow      Hypertension - Primary (Chronic)    Improved on recheck.  She will continue losartan 50 mg daily.  I will refill her hydrochlorothiazide 12.5 mg daily.      Relevant Medications   hydrochlorothiazide (MICROZIDE) 12.5 MG capsule   losartan (COZAAR) 50 MG tablet   Closed fracture of right distal humerus    She will continue to see orthopedics.  Discussed the need for bone density scan given her fracture.      Relevant Orders   DG Bone Density    Return in about 3 months (around 12/18/2021) for weight.   Marikay Alar, MD [facility]

## 2021-09-17 NOTE — Assessment & Plan Note (Signed)
Improved on recheck.  She will continue losartan 50 mg daily.  I will refill her hydrochlorothiazide 12.5 mg daily.

## 2021-09-17 NOTE — Patient Instructions (Signed)
Nice to see you. Please call [phone] to schedule your bone density scan. 

## 2021-09-17 NOTE — Assessment & Plan Note (Signed)
Discussed dietary changes including eating more lean meats and fruits and vegetables and less fatty/sugary foods.  Discussed she would need to be able to exercise to be able to lose weight and exercise would be difficult until she has improved with her elbow

## 2021-09-23 ENCOUNTER — Ambulatory Visit: Payer: Self-pay | Admitting: Student

## 2021-09-26 ENCOUNTER — Encounter (HOSPITAL_COMMUNITY): Payer: Self-pay | Admitting: Student

## 2021-09-26 ENCOUNTER — Other Ambulatory Visit: Payer: Self-pay

## 2021-09-26 NOTE — Progress Notes (Signed)
PCP - Marikay Alar, MD Cardiologist - denies  PPM/ICD - denies  Chest x-ray - n/a EKG - 07/11/21  CPAP - n/a  Fasting Blood Sugar - n/a  Blood Thinner Instructions: n/a Aspirin Instructions: Patient was instructed: As of today, STOP taking any Aspirin (unless otherwise instructed by your surgeon) Aleve, Naproxen, Ibuprofen, Motrin, Advil, Goody's, BC's, all herbal medications, fish oil, and all vitamins.  ERAS Protcol - n/a  COVID TEST- n/a  Anesthesia review: no  Patient verbally denies any shortness of breath, fever, cough and chest pain during phone call   -------------  SDW INSTRUCTIONS given:  Your procedure is scheduled on Monday, August 21st, 2023.  Report to [facility] Main Entrance "A" at 05:30 A.M., and check in at the Admitting office.  Call this number if you have problems the morning of surgery:  [phone]   Remember:  Do not eat or drink after midnight the night before your surgery    Take these medicines the morning of surgery with A SIP OF WATER Crestor    The day of surgery:                    Do not wear jewelry, make up, or nail polish            Do not wear lotions, powders, perfumes, or deodorant.            Do not shave 48 hours prior to surgery.              Do not bring valuables to the hospital.            [facility] is not responsible for any belongings or valuables.  Do NOT Smoke (Tobacco/Vaping) 24 hours prior to your procedure If you use a CPAP at night, you may bring all equipment for your overnight stay.   Contacts, glasses, dentures or bridgework may not be worn into surgery.      For patients admitted to the hospital, discharge time will be determined by your treatment team.   Patients discharged the day of surgery will not be allowed to drive home, and someone needs to stay with them for 24 hours.    Special instructions:   [facility]- Preparing For Surgery  Before surgery, you can play an important role. Because  skin is not sterile, your skin needs to be as free of germs as possible. You can reduce the number of germs on your skin by washing with CHG (chlorahexidine gluconate) Soap before surgery.  CHG is an antiseptic cleaner which kills germs and bonds with the skin to continue killing germs even after washing.    Oral Hygiene is also important to reduce your risk of infection.  Remember - BRUSH YOUR TEETH THE MORNING OF SURGERY WITH YOUR REGULAR TOOTHPASTE  Please do not use if you have an allergy to CHG or antibacterial soaps. If your skin becomes reddened/irritated stop using the CHG.  Do not shave (including legs and underarms) for at least 48 hours prior to first CHG shower. It is OK to shave your face.  Please follow these instructions carefully.   Shower the NIGHT BEFORE SURGERY and the MORNING OF SURGERY with DIAL Soap.   Pat yourself dry with a CLEAN TOWEL.  Wear CLEAN PAJAMAS to bed the night before surgery  Place CLEAN SHEETS on your bed the night of your first shower and DO NOT SLEEP WITH PETS.   Day of Surgery: Please shower morning of  surgery  Wear Clean/Comfortable clothing the morning of surgery Do not apply any deodorants/lotions.   Remember to brush your teeth WITH YOUR REGULAR TOOTHPASTE.   Questions were answered. Patient verbalized understanding of instructions.

## 2021-09-28 NOTE — Anesthesia Preprocedure Evaluation (Signed)
Anesthesia Evaluation  Patient identified by MRN, date of birth, ID band Patient awake    Reviewed: Allergy & Precautions, NPO status , Patient's Chart, lab work & pertinent test results  History of Anesthesia Complications Negative for: history of anesthetic complications  Airway Mallampati: II  TM Distance: <3 FB Neck ROM: Full    Dental  (+) Edentulous Upper, Edentulous Lower   Pulmonary neg pulmonary ROS,    Pulmonary exam normal        Cardiovascular hypertension, Pt. on medications Normal cardiovascular exam     Neuro/Psych negative neurological ROS  negative psych ROS   GI/Hepatic negative GI ROS, Neg liver ROS,   Endo/Other   Pre-DM Obesity   Renal/GU negative Renal ROS     Musculoskeletal negative musculoskeletal ROS (+)   Abdominal   Peds  Hematology negative hematology ROS (+)   Anesthesia Other Findings   Reproductive/Obstetrics                            Anesthesia Physical Anesthesia Plan  ASA: 2  Anesthesia Plan: General   Post-op Pain Management: Tylenol PO (pre-op)*   Induction: Intravenous  PONV Risk Score and Plan: 3 and Treatment may vary due to age or medical condition, Ondansetron and Dexamethasone  Airway Management Planned: LMA  Additional Equipment: None  Intra-op Plan:   Post-operative Plan: Extubation in OR  Informed Consent: I have reviewed the patients History and Physical, chart, labs and discussed the procedure including the risks, benefits and alternatives for the proposed anesthesia with the patient or authorized representative who has indicated his/her understanding and acceptance.     Dental advisory given  Plan Discussed with: CRNA and Anesthesiologist  Anesthesia Plan Comments:        Anesthesia Quick Evaluation

## 2021-09-28 NOTE — H&P (Signed)
Orthopaedic Trauma Service (OTS) H&P  Patient ID: ALEC MCPHEE MRN: [id number] DOB/AGE: Dec 20, 1950 71 y.o.  Reason for surgery: Right elbow stiffness status post ORIF distal humerus  HPI: Erika Perry is an 71 y.o. female presenting for surgery on the right upper extremity.  Patient underwent ORIF of right distal humerus fracture on 07/11/2021 after sustaining a fall at work.  Patient has been working with physical therapy on aggressive elbow range of motion but continues to have significant limitations which have not allowed her to return to functional activities using the right upper extremity.  Over the last several weeks she has not made significant progress in her elbow motion despite her best efforts, including the addition progressive splinting of the elbow.  She presents today for close manipulation of the right elbow.  Prior to initial injury, did not note any limitations with range of motion or strength in the right upper extremity.  Past Medical History:  Diagnosis Date   BMI 35.0-35.9,adult 07/17/2020   Closed fracture of right distal humerus 07/12/2021   HLD (hyperlipidemia)    Hypertension    Pre-diabetes    Vitamin D deficiency 07/13/2021    Past Surgical History:  Procedure Laterality Date   CESAREAN SECTION  1981, 1990   ORIF HUMERUS FRACTURE Right 07/11/2021   Procedure: OPEN REDUCTION INTERNAL FIXATION (ORIF) DISTAL HUMERUS FRACTURE;  Surgeon: Roby Lofts, MD;  Location: [facility] OR;  Service: Orthopedics;  Laterality: Right;   TONSILLECTOMY      Family History  Problem Relation Age of Onset   Cancer Mother    Diabetes Mother    Hypertension Mother    Other Father        unknown medical history    Social History:  reports that she has never smoked. She has never used smokeless tobacco. She reports that she does not drink alcohol and does not use drugs.  Allergies:  Allergies  Allergen Reactions   Latex     Pt states that she is allergic to latex in pre-op.   Regardless of allergy type latex will not be used.    Bactrim [Sulfamethoxazole-Trimethoprim]     Nausea, no appitite   Penicillins     Tolerated Cephalosporin Date: 07/11/2021.      Medications: I have reviewed the patient's current medications. Prior to Admission:  No medications prior to admission.    ROS: Constitutional: No fever or chills Vision: No changes in vision ENT: No difficulty swallowing CV: No chest pain Pulm: No SOB or wheezing GI: No nausea or vomiting GU: No urgency or inability to hold urine Skin: No poor wound healing Neurologic: No numbness or tingling Psychiatric: No depression or anxiety Heme: No bruising Allergic: No reaction to medications or food   Exam: There were no vitals taken for this visit. General: No acute distress Orientation: Alert and oriented x4 Mood and Affect: Mood and affect appropriate, pleasant and cooperative Gait: Within normal limits Coordination and balance: Within normal limits  Right upper extremity: Well-healed surgical incision to the posterior arm.  No significant tenderness with palpation about the elbow.  Does have significant limitations with range of motion of the shoulder.  70 degree arc of motion of the elbow.  Pain and stiffness noted with passive and active wrist flexion/extension.  Unable to make a full fist.  Endorses sensation throughout all aspects of the hand.  Is neurovascularly intact  Left upper extremity: Skin without lesions. No tenderness to palpation. Full painless ROM, full strength in  each muscle group without evidence of instability.  Motor and sensory function at baseline.  Neurovascularly intact   Medical Decision Making: Data: Imaging: AP and lateral views of the right elbow show distal humerus fracture in appropriate alignment with medial lateral plating.  No signs of any hardware failure or loosening.  No signs of early posttraumatic arthritis.  Labs: No results found for this or any previous  visit (from the past 168 hour(s)).   Assessment/Plan: 71 year old female status post ORIF right distal humerus fracture 07/11/2021  Patient with significant stiffness to right elbow despite aggressive physical therapies and use of Ermi elbow extensionator device.  At this point, I recommend proceeding with closed manipulation of the right elbow.  That manual manipulation of the elbow, I do not feel that she will regain a significant amount of range of motion to allow the arm to become more functional.  Risks and benefits of the procedure were discussed with the patient, with the biggest risk being continued stiffness.  Patient agrees to proceed with closed manipulation.  We will plan to discharge patient home postoperatively and allow for continued aggressive range of motion of the right shoulder, elbow, wrist, hand.

## 2021-09-29 ENCOUNTER — Other Ambulatory Visit: Payer: Self-pay

## 2021-09-29 ENCOUNTER — Ambulatory Visit (HOSPITAL_COMMUNITY): Payer: Medicare Other

## 2021-09-29 ENCOUNTER — Encounter (HOSPITAL_COMMUNITY)
Admission: RE | Disposition: A | Discharge status: Home / Self Care | Payer: Self-pay | Source: Home / Self Care | Attending: Student

## 2021-09-29 ENCOUNTER — Encounter (HOSPITAL_COMMUNITY): Payer: Self-pay | Admitting: Student

## 2021-09-29 ENCOUNTER — Ambulatory Visit (HOSPITAL_COMMUNITY)
Admission: RE | Admit: 2021-09-29 | Discharge: 2021-09-29 | Disposition: A | Discharge status: Home / Self Care | Payer: Worker's Compensation | Attending: Student | Admitting: Student

## 2021-09-29 ENCOUNTER — Ambulatory Visit (HOSPITAL_BASED_OUTPATIENT_CLINIC_OR_DEPARTMENT_OTHER): Payer: Worker's Compensation | Admitting: Anesthesiology

## 2021-09-29 ENCOUNTER — Ambulatory Visit (HOSPITAL_COMMUNITY): Payer: Worker's Compensation | Admitting: Anesthesiology

## 2021-09-29 DIAGNOSIS — Z6835 Body mass index (BMI) 35.0-35.9, adult: Secondary | ICD-10-CM | POA: Diagnosis not present

## 2021-09-29 DIAGNOSIS — E669 Obesity, unspecified: Secondary | ICD-10-CM | POA: Insufficient documentation

## 2021-09-29 DIAGNOSIS — M24621 Ankylosis, right elbow: Secondary | ICD-10-CM | POA: Insufficient documentation

## 2021-09-29 DIAGNOSIS — R7303 Prediabetes: Secondary | ICD-10-CM | POA: Diagnosis not present

## 2021-09-29 DIAGNOSIS — I10 Essential (primary) hypertension: Secondary | ICD-10-CM | POA: Diagnosis not present

## 2021-09-29 HISTORY — PX: CLOSED REDUCTION ELBOW FRACTURE: SHX930

## 2021-09-29 LAB — BASIC METABOLIC PANEL
Anion gap: 7 (ref 5–15)
BUN: 14 mg/dL (ref 8–23)
CO2: 30 mmol/L (ref 22–32)
Calcium: 9.1 mg/dL (ref 8.9–10.3)
Chloride: 99 mmol/L (ref 98–111)
Creatinine, Ser: 0.78 mg/dL (ref 0.44–1.00)
GFR, Estimated: >60 mL/min (ref >60)
Glucose, Bld: 221 mg/dL — ABNORMAL HIGH (ref 70–99)
Potassium: 3.9 mmol/L (ref 3.5–5.1)
Sodium: 136 mmol/L (ref 135–145)

## 2021-09-29 SURGERY — CLOSED REDUCTION, ELBOW
Anesthesia: General | Site: Elbow | Laterality: Right

## 2021-09-29 MED ORDER — MIDAZOLAM HCL 2 MG/2ML IJ SOLN
INTRAMUSCULAR | Status: AC
  Filled 2021-09-29: qty 2

## 2021-09-29 MED ORDER — HYDROCODONE-ACETAMINOPHEN 5-325 MG PO TABS
1.0000 | ORAL_TABLET | ORAL | 0 refills | Status: DC | PRN
Start: 2021-09-29 — End: 2021-12-23

## 2021-09-29 MED ORDER — ACETAMINOPHEN 500 MG PO TABS
1000.0000 mg | ORAL_TABLET | Freq: Once | ORAL | Status: AC
Start: 2021-09-29 — End: 2021-09-29
  Administered 2021-09-29: 1000 mg via ORAL
  Filled 2021-09-29: qty 2

## 2021-09-29 MED ORDER — PROPOFOL 10 MG/ML IV BOLUS
INTRAVENOUS | Status: DC | PRN
  Administered 2021-09-29: 140 mg via INTRAVENOUS

## 2021-09-29 MED ORDER — DEXAMETHASONE SODIUM PHOSPHATE 10 MG/ML IJ SOLN
INTRAMUSCULAR | Status: AC
  Filled 2021-09-29: qty 1

## 2021-09-29 MED ORDER — ONDANSETRON HCL 4 MG/2ML IJ SOLN
INTRAMUSCULAR | Status: AC
  Filled 2021-09-29: qty 2

## 2021-09-29 MED ORDER — FENTANYL CITRATE (PF) 100 MCG/2ML IJ SOLN: 25.0000 ug | INTRAMUSCULAR | Status: DC | PRN

## 2021-09-29 MED ORDER — ONDANSETRON HCL 4 MG/2ML IJ SOLN
INTRAMUSCULAR | Status: DC | PRN
  Administered 2021-09-29: 4 mg via INTRAVENOUS

## 2021-09-29 MED ORDER — FENTANYL CITRATE (PF) 100 MCG/2ML IJ SOLN
INTRAMUSCULAR | Status: DC | PRN
  Administered 2021-09-29: 50 ug via INTRAVENOUS

## 2021-09-29 MED ORDER — LIDOCAINE 2% (20 MG/ML) 5 ML SYRINGE
INTRAMUSCULAR | Status: AC
  Filled 2021-09-29: qty 5

## 2021-09-29 MED ORDER — OXYCODONE HCL 5 MG PO TABS
ORAL_TABLET | ORAL | Status: AC
  Administered 2021-09-29: 5 mg via ORAL
  Filled 2021-09-29: qty 1

## 2021-09-29 MED ORDER — FENTANYL CITRATE (PF) 250 MCG/5ML IJ SOLN
INTRAMUSCULAR | Status: AC
  Filled 2021-09-29: qty 5

## 2021-09-29 MED ORDER — OXYCODONE HCL 5 MG/5ML PO SOLN: 5.0000 mg | Freq: Once | ORAL | Status: AC | PRN

## 2021-09-29 MED ORDER — LIDOCAINE 2% (20 MG/ML) 5 ML SYRINGE
INTRAMUSCULAR | Status: DC | PRN
  Administered 2021-09-29: 60 mg via INTRAVENOUS

## 2021-09-29 MED ORDER — ONDANSETRON HCL 4 MG/2ML IJ SOLN
4.0000 mg | Freq: Once | INTRAMUSCULAR | Status: DC | PRN
Start: 2021-09-29 — End: 2021-09-29

## 2021-09-29 MED ORDER — OXYCODONE HCL 5 MG PO TABS: 5.0000 mg | ORAL_TABLET | Freq: Once | ORAL | Status: AC | PRN

## 2021-09-29 MED ORDER — CHLORHEXIDINE GLUCONATE 0.12 % MT SOLN
15.0000 mL | Freq: Once | OROMUCOSAL | Status: AC
  Administered 2021-09-29: 15 mL via OROMUCOSAL
  Filled 2021-09-29: qty 15

## 2021-09-29 MED ORDER — LACTATED RINGERS IV SOLN: INTRAVENOUS | Status: DC

## 2021-09-29 MED ORDER — ORAL CARE MOUTH RINSE: 15.0000 mL | Freq: Once | OROMUCOSAL | Status: AC

## 2021-09-29 MED ORDER — PROPOFOL 10 MG/ML IV BOLUS
INTRAVENOUS | Status: AC
  Filled 2021-09-29: qty 20

## 2021-09-29 MED ORDER — DEXAMETHASONE SODIUM PHOSPHATE 10 MG/ML IJ SOLN
INTRAMUSCULAR | Status: DC | PRN
  Administered 2021-09-29: 10 mg via INTRAVENOUS

## 2021-09-29 MED ORDER — MIDAZOLAM HCL 5 MG/5ML IJ SOLN
INTRAMUSCULAR | Status: DC | PRN
  Administered 2021-09-29: 2 mg via INTRAVENOUS

## 2021-09-29 SURGICAL SUPPLY — 1 items: BANDAGE ELASTIC 3 LF NS (GAUZE/BANDAGES/DRESSINGS) IMPLANT

## 2021-09-29 NOTE — Discharge Instructions (Signed)
[facility] Discharge Instructions   General Discharge Instructions  WEIGHT BEARING STATUS:Weightbearing as tolerated right upper extremity  RANGE OF MOTION/ACTIVITY: Ok for unrestricted shoulder, elbow, wrist/hand motion as tolerated. Continue working with progressive stretching extension device  DVT/PE prophylaxis: None  Diet: as you were eating previously.  Can use over the counter stool softeners and bowel preparations, such as Miralax, to help with bowel movements.  Narcotics can be constipating.  Be sure to drink plenty of fluids  PAIN MEDICATION USE AND EXPECTATIONS  You have likely been given narcotic medications to help control your pain.  After a traumatic event that results in an fracture (broken bone) with or without surgery, it is ok to use narcotic pain medications to help control one's pain.  We understand that everyone responds to pain differently and each individual patient will be evaluated on a regular basis for the continued need for narcotic medications. Ideally, narcotic medication use should last no more than 6-8 weeks (coinciding with fracture healing).   As a patient it is your responsibility as well to monitor narcotic medication use and report the amount and frequency you use these medications when you come to your office visit.   We would also advise that if you are using narcotic medications, you should take a dose prior to therapy to maximize you participation.  IF YOU ARE ON NARCOTIC MEDICATIONS IT IS NOT PERMISSIBLE TO OPERATE A MOTOR VEHICLE (MOTORCYCLE/CAR/TRUCK/MOPED) OR HEAVY MACHINERY DO NOT MIX NARCOTICS WITH OTHER CNS (CENTRAL NERVOUS SYSTEM) DEPRESSANTS SUCH AS ALCOHOL   STOP SMOKING OR USING NICOTINE PRODUCTS!!!!  As discussed nicotine severely impairs your body's ability to heal surgical and traumatic wounds but also impairs bone healing.  Wounds and bone heal by forming microscopic blood vessels (angiogenesis) and nicotine is a  vasoconstrictor (essentially, shrinks blood vessels).  Therefore, if vasoconstriction occurs to these microscopic blood vessels they essentially disappear and are unable to deliver necessary nutrients to the healing tissue.  This is one modifiable factor that you can do to dramatically increase your chances of healing your injury.    (This means no smoking, no nicotine gum, patches, etc)  ICE AND ELEVATE INJURED/OPERATIVE EXTREMITY  Using ice and elevating the injured extremity above your heart can help with swelling and pain control.  Icing in a pulsatile fashion, such as 20 minutes on and 20 minutes off, can be followed.    Do not place ice directly on skin. Make sure there is a barrier between to skin and the ice pack.    Using frozen items such as frozen peas works well as the conform nicely to the are that needs to be iced.  USE AN ACE WRAP OR TED HOSE FOR SWELLING CONTROL  In addition to icing and elevation, Ace wraps or TED hose are used to help limit and resolve swelling.  It is recommended to use Ace wraps or TED hose until you are informed to stop.    When using Ace Wraps start the wrapping distally (farthest away from the body) and wrap proximally (closer to the body)   Example: If you had surgery on your leg or thing and you do not have a splint on, start the ace wrap at the toes and work your way up to the thigh        If you had surgery on your upper extremity and do not have a splint on, start the ace wrap at your fingers and work your way up to the  upper arm   CALL THE OFFICE WITH ANY QUESTIONS OR CONCERNS: [phone]   VISIT OUR WEBSITE FOR ADDITIONAL INFORMATION: [email or web address]

## 2021-09-29 NOTE — Transfer of Care (Signed)
Immediate Anesthesia Transfer of Care Note  Patient: Erika Perry  Procedure(s) Performed: CLOSED MANIPULATION ELBOW (Right: Elbow)  Patient Location: PACU  Anesthesia Type:General  Level of Consciousness: drowsy and patient cooperative  Airway & Oxygen Therapy: Patient Spontanous Breathing and Patient connected to nasal cannula oxygen  Post-op Assessment: Report given to RN and Post -op Vital signs reviewed and stable  Post vital signs: Reviewed and stable  Last Vitals:  Vitals Value Taken Time  BP 148/69 09/29/21 0815  Temp    Pulse 97 09/29/21 0816  Resp 17 09/29/21 0816  SpO2 95 % 09/29/21 0816  Vitals shown include unvalidated device data.  Last Pain:  Vitals:   09/29/21 0639  TempSrc:   PainSc: 0-No pain         Complications: No notable events documented.

## 2021-09-29 NOTE — Anesthesia Postprocedure Evaluation (Signed)
Anesthesia Post Note  Patient: Erika Perry  Procedure(s) Performed: CLOSED MANIPULATION ELBOW (Right: Elbow)     Patient location during evaluation: PACU Anesthesia Type: General Level of consciousness: awake and alert Pain management: pain level controlled Vital Signs Assessment: post-procedure vital signs reviewed and stable Respiratory status: spontaneous breathing, nonlabored ventilation and respiratory function stable Cardiovascular status: stable and blood pressure returned to baseline Anesthetic complications: no   No notable events documented.  Last Vitals:  Vitals:   09/29/21 0830 09/29/21 0845  BP: (!) 159/64 (!) 136/57  Pulse: 90 95  Resp: 18 20  Temp:  36.4 C  SpO2: 97% 91%    Last Pain:  Vitals:   09/29/21 0845  TempSrc:   PainSc: 6                  Beryle Lathe

## 2021-09-29 NOTE — Op Note (Signed)
Orthopaedic Surgery Operative Note (CSN: [id number] ) Date of Surgery: 09/29/2021  Admit Date: 09/29/2021   Diagnoses: Pre-Op Diagnoses: Right elbow arthrofibrosis  Post-Op Diagnosis: Same  Procedures: CPT 24300-Closed manipulation of right elbow  Surgeons : Primary: Roby Lofts, MD  Assistant: Ulyses Southward, PA-C  Location: OR 3   Anesthesia:General   Antibiotics: None   Tourniquet time:None   Estimated Blood Loss:None  Complications:None  Specimens:None  Implants: * No implants in log *   Indications for Surgery: 71 year old female who sustained a right intra-articular distal humerus fracture on the right that underwent open reduction internal fixation on 07/11/2021 nearly 3 weeks after her initial injury.  She had physical therapy but unfortunately she continued to have severe stiffness that affected her upper extremity and her ability to function.  This included use of her hand and wrist.  I felt that she was indicated for closed manipulation to assist with regaining elbow motion.  Operative Findings: Premanipulation flexion was only about 90 degrees and extension was degrees short of full extension.  Postmanipulation was about 35 degrees short of full extension and flexion was about 130 degrees.  Procedure: The patient was identified in the preoperative holding area. Consent was confirmed with the patient and their family and all questions were answered. The operative extremity was marked after confirmation with the patient. she was then brought back to the operating room by our anesthesia colleagues.  She was placed under general anesthetic.  She was carefully transferred over to radiolucent flat top table.  A timeout was performed to verify the patient, the procedure, and the extremity.  Preoperative antibiotics were not dosed due to the close nature of the procedure.  I for started out by manipulating her IP and MCP joints.  I was able to get good flexion of the  digits to where she was able to make a fist.  I then manipulated her wrist.  I was able to slowly extend and flex her wrist to break up some scar tissue.  I then slowly manipulated her elbow into extension.  I was able to get some good scar tissue and got nearly 35 degrees short of full extension.  I then slowly and gently flexed the elbow until I was able to get the hand all the way to her shoulder.  There is no pops or concern for loss of fixation.  Fluoroscopic imaging showed a fixation that was stable after manipulation.  Patient was then awoken from anesthesia and taken to the PACU in stable condition.           Post Op Plan/Instructions: The patient will be weightbearing as tolerated to the right upper extremity.  She will receive physical therapy this week.  Note DVT prophylaxis is needed.  She will be discharged home from the PACU.  I was present and performed the entire surgery.  Ulyses Southward, PA-C did assist me throughout the case. An assistant was necessary given the difficulty in approach, maintenance of reduction and ability to instrument the fracture.   Truitt Merle, MD [facility]

## 2021-09-29 NOTE — Anesthesia Procedure Notes (Signed)
Procedure Name: LMA Insertion Date/Time: 09/29/2021 7:40 AM  Performed by: Shireen Quan, CRNAPre-anesthesia Checklist: Patient identified, Emergency Drugs available, Suction available and Patient being monitored Patient Re-evaluated:Patient Re-evaluated prior to induction Oxygen Delivery Method: Circle System Utilized Preoxygenation: Pre-oxygenation with 100% oxygen Induction Type: IV induction Ventilation: Mask ventilation without difficulty LMA: LMA inserted LMA Size: 4.0 Number of attempts: 1 Placement Confirmation: positive ETCO2 Tube secured with: Tape Dental Injury: Teeth and Oropharynx as per pre-operative assessment

## 2021-09-29 NOTE — Interval H&P Note (Signed)
History and Physical Interval Note:  09/29/2021 7:19 AM  Erika Perry  has presented today for surgery, with the diagnosis of Right elbow arthrofibrosis.  The various methods of treatment have been discussed with the patient and family. After consideration of risks, benefits and other options for treatment, the patient has consented to  Procedure(s): CLOSED MANIPULATION ELBOW (Right) as a surgical intervention.  The patient's history has been reviewed, patient examined, no change in status, stable for surgery.  I have reviewed the patient's chart and labs.  Questions were answered to the patient's satisfaction.     Caryn Bee P Edker Punt

## 2021-09-30 ENCOUNTER — Encounter (HOSPITAL_COMMUNITY): Payer: Self-pay | Admitting: Student

## 2021-11-04 DIAGNOSIS — L03114 Cellulitis of left upper limb: Secondary | ICD-10-CM | POA: Diagnosis not present

## 2021-11-04 DIAGNOSIS — L03113 Cellulitis of right upper limb: Secondary | ICD-10-CM | POA: Diagnosis not present

## 2021-12-23 ENCOUNTER — Ambulatory Visit (INDEPENDENT_AMBULATORY_CARE_PROVIDER_SITE_OTHER): Payer: Medicare Other | Admitting: Family Medicine

## 2021-12-23 ENCOUNTER — Encounter: Payer: Self-pay | Admitting: Family Medicine

## 2021-12-23 VITALS — BP 120/70 | HR 92 | Temp 98.1°F | Resp 14 | Ht 62.0 in | Wt 191.6 lb

## 2021-12-23 DIAGNOSIS — N898 Other specified noninflammatory disorders of vagina: Secondary | ICD-10-CM

## 2021-12-23 DIAGNOSIS — Z1231 Encounter for screening mammogram for malignant neoplasm of breast: Secondary | ICD-10-CM

## 2021-12-23 DIAGNOSIS — E785 Hyperlipidemia, unspecified: Secondary | ICD-10-CM | POA: Diagnosis not present

## 2021-12-23 DIAGNOSIS — S42401D Unspecified fracture of lower end of right humerus, subsequent encounter for fracture with routine healing: Secondary | ICD-10-CM | POA: Diagnosis not present

## 2021-12-23 DIAGNOSIS — R7303 Prediabetes: Secondary | ICD-10-CM

## 2021-12-23 DIAGNOSIS — I10 Essential (primary) hypertension: Secondary | ICD-10-CM

## 2021-12-23 DIAGNOSIS — Z6835 Body mass index (BMI) 35.0-35.9, adult: Secondary | ICD-10-CM

## 2021-12-23 LAB — LIPID PANEL
Cholesterol: 89 mg/dL (ref 0–200)
HDL: 31.8 mg/dL — ABNORMAL LOW (ref >39.00)
LDL Cholesterol: 25 mg/dL (ref 0–99)
NonHDL: 57.53
Total CHOL/HDL Ratio: 3
Triglycerides: 161 mg/dL — ABNORMAL HIGH (ref 0.0–149.0)
VLDL: 32.2 mg/dL (ref 0.0–40.0)

## 2021-12-23 LAB — HEPATIC FUNCTION PANEL
ALT: 11 U/L (ref 0–35)
AST: 14 U/L (ref 0–37)
Albumin: 4 g/dL (ref 3.5–5.2)
Alkaline Phosphatase: 93 U/L (ref 39–117)
Bilirubin, Direct: 0.2 mg/dL (ref 0.0–0.3)
Total Bilirubin: 0.9 mg/dL (ref 0.2–1.2)
Total Protein: 6.5 g/dL (ref 6.0–8.3)

## 2021-12-23 LAB — HEMOGLOBIN A1C: Hgb A1c MFr Bld: 11.1 % — ABNORMAL HIGH (ref 4.6–6.5)

## 2021-12-23 MED ORDER — FLUCONAZOLE 150 MG PO TABS: 150.0000 mg | ORAL_TABLET | ORAL | 0 refills | Status: AC

## 2021-12-23 NOTE — Assessment & Plan Note (Signed)
Status post 2 surgeries.  She will see orthopedics in [facility] for a second opinion.

## 2021-12-23 NOTE — Assessment & Plan Note (Signed)
Improved on recheck.  Adequately controlled.  She will continue HCTZ 12.5 mg daily and losartan 50 mg daily.

## 2021-12-23 NOTE — Patient Instructions (Signed)
Nice to see you. We will contact you with your lab results. You are due for a mammogram and bone density scan.  Please call [phone] to schedule this and your bone density scan.

## 2021-12-23 NOTE — Assessment & Plan Note (Signed)
Check A1c.  Continue to work on diet and exercise.  I encouraged walking for exercise given that she does not have good use of her right upper extremity at this time.

## 2021-12-23 NOTE — Progress Notes (Signed)
Marikay Alar, MD Phone: [phone]  Erika Perry is a 71 y.o. female who presents today for f/u.  HYPERTENSION Disease Monitoring Home BP Monitoring not checking Chest pain- no    Dyspnea- no Medications Compliance-  taking HCTZ, losartan.   Edema- no BMET    Component Value Date/Time   NA 136 09/29/2021 0606   NA 142 04/16/2020 0951   NA 141 10/10/2012 1749   K 3.9 09/29/2021 0606   K 3.5 10/10/2012 1749   CL 99 09/29/2021 0606   CL 107 10/10/2012 1749   CO2 30 09/29/2021 0606   CO2 26 10/10/2012 1749   GLUCOSE 221 (H) 09/29/2021 0606   GLUCOSE 82 10/10/2012 1749   BUN 14 09/29/2021 0606   BUN 11 04/16/2020 0951   BUN 12 10/10/2012 1749   CREATININE 0.78 09/29/2021 0606   CREATININE 0.55 (L) 10/10/2012 1749   CALCIUM 9.1 09/29/2021 0606   CALCIUM 8.9 10/10/2012 1749   GFRNONAA >60 09/29/2021 0606   GFRNONAA >60 10/10/2012 1749   GFRAA >60 10/10/2012 1749   HYPERLIPIDEMIA Symptoms Chest pain on exertion:  no   Medications: Compliance- taking crestor Right upper quadrant pain- no  Muscle aches- no Lipid Panel     Component Value Date/Time   CHOL 173 10/21/2020 0825   TRIG 106 10/21/2020 0825   HDL 39 (L) 10/21/2020 0825   CHOLHDL 3.8 04/16/2020 0951   CHOLHDL 5 07/11/2018 1024   VLDL 32.4 07/11/2018 1024   LDLCALC 115 (H) 10/21/2020 0825   LDLDIRECT 54.0 08/11/2021 1350   LABVLDL 19 10/21/2020 0825   Prediabetes: Patient is drinking more water.  She is eating plenty of fruits and vegetables.  She has 1 glass of sweet tea daily.  She occasionally has sweets and junk food though not often.  Right humerus fracture: Patient is status post open reduction internal fixation as well as a closed manipulation of the right elbow.  She still has not gained full use of her right hand and does not have good range of motion at her elbow.  She is seeing another provider in [place] for a second opinion.  She was treated for a cellulitis notes that resolved with  antibiotics and has not had any recurrent erythema in her right upper extremity.  Vaginal itching: Patient notes some vaginal itching for the past week or so.  No vaginal discharge.  She notes in the past she has been treated with something for this though she is not sure if it was a yeast infection or some other type of bacterial infection.  She defers an exam today.  Social History   Tobacco Use  Smoking Status Never  Smokeless Tobacco Never    Current Outpatient Medications on File Prior to Visit  Medication Sig Dispense Refill   hydrochlorothiazide (MICROZIDE) 12.5 MG capsule Take 1 capsule (12.5 mg total) by mouth daily. 90 capsule 3   losartan (COZAAR) 50 MG tablet Take 1 tablet (50 mg total) by mouth daily. 90 tablet 3   rosuvastatin (CRESTOR) 20 MG tablet Take 1 tablet (20 mg total) by mouth daily. 90 tablet 1   [DISCONTINUED] atorvastatin (LIPITOR) 10 MG tablet Take 1 tablet (10 mg total) by mouth daily. 90 tablet 1   [DISCONTINUED] metFORMIN (GLUCOPHAGE) 500 MG tablet Take 1 tablet (500 mg total) by mouth 2 (two) times daily with a meal. 180 tablet 1   No current facility-administered medications on file prior to visit.     ROS see history of present  illness  Objective  Physical Exam Vitals:   12/23/21 0923 12/23/21 0937  BP: (!) 140/80 120/70  Pulse: 92   Resp: 14   Temp: 98.1 F (36.7 C)   SpO2: 96%     BP Readings from Last 3 Encounters:  12/23/21 120/70  09/29/21 (!) 136/57  09/17/21 128/80   Wt Readings from Last 3 Encounters:  12/23/21 191 lb 9.6 oz (86.9 kg)  09/29/21 194 lb (88 kg)  09/17/21 198 lb 12.8 oz (90.2 kg)    Physical Exam Constitutional:      General: She is not in acute distress.    Appearance: She is not diaphoretic.  Cardiovascular:     Rate and Rhythm: Normal rate and regular rhythm.     Heart sounds: Normal heart sounds.  Pulmonary:     Effort: Pulmonary effort is normal.     Breath sounds: Normal breath sounds.  Skin:     General: Skin is warm and dry.  Neurological:     Mental Status: She is alert.      Assessment/Plan: Please see individual problem list.  Problem List Items Addressed This Visit     BMI 35.0-35.9,adult (Chronic)    Congratulated on weight loss.  She will continue to work on diet and exercise.      Hyperlipidemia (Chronic)    Check lipid panel.  Continue Crestor 20 mg daily.      Relevant Orders   Hepatic function panel   Lipid panel   Hypertension (Chronic)    Improved on recheck.  Adequately controlled.  She will continue HCTZ 12.5 mg daily and losartan 50 mg daily.      Prediabetes (Chronic)    Check A1c.  Continue to work on diet and exercise.  I encouraged walking for exercise given that she does not have good use of her right upper extremity at this time.      Relevant Orders   HgB A1c   Closed fracture of right distal humerus - Primary    Status post 2 surgeries.  She will see orthopedics in [facility] for a second opinion.      Vaginal itching    Possibly related to a yeast infection.  We will trial Diflucan 1 tablet every 3 days for 2 doses and if not improving she will let us know.      Relevant Medications   fluconazole (DIFLUCAN) 150 MG tablet   Other Visit Diagnoses     Encounter for screening mammogram for malignant neoplasm of breast       Relevant Orders   MM 3D SCREEN BREAST BILATERAL        Health Maintenance: Patient will call to schedule her mammogram.  Return in about 6 months (around 06/23/2022) for Weight/hypertension.   Tommi Rumps, MD [facility]

## 2021-12-23 NOTE — Assessment & Plan Note (Signed)
Check lipid panel.  Continue Crestor 20 mg daily. 

## 2021-12-23 NOTE — Assessment & Plan Note (Signed)
Congratulated on weight loss.  She will continue to work on diet and exercise. 

## 2021-12-23 NOTE — Assessment & Plan Note (Signed)
Possibly related to a yeast infection.  We will trial Diflucan 1 tablet every 3 days for 2 doses and if not improving she will let us know.

## 2021-12-29 ENCOUNTER — Telehealth: Payer: Self-pay | Admitting: Family Medicine

## 2021-12-29 ENCOUNTER — Encounter: Payer: Self-pay | Admitting: Family Medicine

## 2021-12-29 ENCOUNTER — Other Ambulatory Visit: Payer: Self-pay | Admitting: Family Medicine

## 2021-12-29 DIAGNOSIS — E785 Hyperlipidemia, unspecified: Secondary | ICD-10-CM

## 2021-12-29 MED ORDER — METFORMIN HCL 500 MG PO TABS: 500.0000 mg | ORAL_TABLET | Freq: Two times a day (BID) | ORAL | 3 refills | Status: DC

## 2021-12-29 NOTE — Telephone Encounter (Signed)
Pt need a refill on rosuvastatin sent to [facility] and pt want to discuss metformin

## 2021-12-30 ENCOUNTER — Telehealth: Payer: Self-pay

## 2021-12-30 ENCOUNTER — Other Ambulatory Visit: Payer: Self-pay

## 2021-12-30 DIAGNOSIS — E785 Hyperlipidemia, unspecified: Secondary | ICD-10-CM

## 2021-12-30 MED ORDER — ROSUVASTATIN CALCIUM 20 MG PO TABS: 20.0000 mg | ORAL_TABLET | Freq: Every day | ORAL | 3 refills | Status: DC

## 2021-12-30 NOTE — Telephone Encounter (Signed)
I called and lvm informing the patient that the metformin was at [facility] in [place] for her.  Jamal Pavon,cma

## 2021-12-30 NOTE — Telephone Encounter (Signed)
Patient states they were going to put her on metformin but they never called it into the drug store and she is wondering what is going on.

## 2021-12-30 NOTE — Telephone Encounter (Signed)
Please find out what she wants to discuss about the metformin. I sent her a response on mychart about starting the metformin so we could see if she responds to that prior to calling her.

## 2021-12-30 NOTE — Telephone Encounter (Signed)
Crestor pended for you to sign and patient wants to discuss metformin

## 2021-12-30 NOTE — Telephone Encounter (Signed)
Lvm for patient to call back if she does not answer on mychart.  Haylea Schlichting,cma

## 2022-01-14 NOTE — Telephone Encounter (Signed)
Metformin does not usually cause a rash. It would be best to have a visit to visualize the rash and determine if it is related to the metformin or something else. She can be scheduled in a 15 minute slot for this.

## 2022-01-14 NOTE — Telephone Encounter (Signed)
Pt has been scheduled 01/19/22 @ 3:15 in 15 min slot.

## 2022-01-14 NOTE — Telephone Encounter (Signed)
I called and spoke with Erika Perry to see what she wanted to discuss about the metformin. Erika Perry stated she has developed a rash and she does not know if the metformin is the cause of it. Erika Perry stated she is still taking it but she has a rash. I asked the Erika Perry does she have any other sx's she stated she feels dizzy and light headed sometimes but not all the time.

## 2022-01-14 NOTE — Telephone Encounter (Signed)
Patient has a rash on her bottom and thinks it is form her metFORMIN (GLUCOPHAGE) 500 MG tablet, she has been on this medication for  a week and has had the rash for 4 days. She wants to know if she should stop taking medication.

## 2022-01-19 ENCOUNTER — Ambulatory Visit (INDEPENDENT_AMBULATORY_CARE_PROVIDER_SITE_OTHER): Payer: Medicare Other | Admitting: Family Medicine

## 2022-01-19 ENCOUNTER — Encounter: Payer: Self-pay | Admitting: Family Medicine

## 2022-01-19 VITALS — BP 110/70 | HR 91 | Temp 98.2°F | Ht 62.0 in | Wt 185.0 lb

## 2022-01-19 DIAGNOSIS — B354 Tinea corporis: Secondary | ICD-10-CM | POA: Insufficient documentation

## 2022-01-19 MED ORDER — KETOCONAZOLE 2 % EX CREA: 1.0000 | TOPICAL_CREAM | Freq: Every day | CUTANEOUS | 0 refills | Status: DC

## 2022-01-19 NOTE — Progress Notes (Signed)
  Marikay Alar, MD Phone: [phone]  Erika Perry is a 71 y.o. female who presents today for same-day visit.  Rash: patient reports that she developed a rash on her torso, back, and buttocks five days ago. She denies pruritus, pain, or weeping from the rash. She denies using any new body care products or laundry detergent. She denies traveling, going camping, or being in contact with another person with the same rash.   Social History   Tobacco Use  Smoking Status Never  Smokeless Tobacco Never    Current Outpatient Medications on File Prior to Visit  Medication Sig Dispense Refill   hydrochlorothiazide (MICROZIDE) 12.5 MG capsule Take 1 capsule (12.5 mg total) by mouth daily. 90 capsule 3   losartan (COZAAR) 50 MG tablet Take 1 tablet (50 mg total) by mouth daily. 90 tablet 3   metFORMIN (GLUCOPHAGE) 500 MG tablet Take 1 tablet (500 mg total) by mouth 2 (two) times daily with a meal. 180 tablet 3   rosuvastatin (CRESTOR) 20 MG tablet Take 1 tablet (20 mg total) by mouth daily. 90 tablet 3   [DISCONTINUED] atorvastatin (LIPITOR) 10 MG tablet Take 1 tablet (10 mg total) by mouth daily. 90 tablet 1   No current facility-administered medications on file prior to visit.     ROS see history of present illness  Objective  Vitals:   01/19/22 1516  BP: 110/70  Pulse: 91  Temp: 98.2 F (36.8 C)  SpO2: 96%    BP Readings from Last 3 Encounters:  01/19/22 110/70  12/23/21 120/70  09/29/21 (!) 136/57   Wt Readings from Last 3 Encounters:  01/19/22 185 lb (83.9 kg)  12/23/21 191 lb 9.6 oz (86.9 kg)  09/29/21 194 lb (88 kg)    Physical Exam Constitutional:      Appearance: Normal appearance.  HENT:     Head: Normocephalic and atraumatic.  Skin:    Findings: Rash present.     Comments: Rash is present on patient's lower torso, back, and buttocks. Rash is composed of groups of well-demarcated erythematous rings with central clearing.  Neurological:     Mental  Status: She is alert.     Assessment/Plan: Please see individual problem list.  Problem List Items Addressed This Visit     Tinea corporis - Primary    Rash is consistent with a fungal ringworm infection. We will start ketoconazole 2% cream daily. Explained to patient that it may take weeks for the rash to resolve. Advised patient that her rash is contagious. Patient understands to not have any skin-to-skin contact with other members of her household and to not share bath towels. If there is no improvement she will contact us for an oral antifungal.  She will additionally call if she runs out of ketoconazole or if it is too much of an effort to apply a cream over such a wide area.      Relevant Medications   ketoconazole (NIZORAL) 2 % cream      Return if symptoms worsen or fail to improve.   Gae Gallop, Medical Student [facility]

## 2022-01-19 NOTE — Patient Instructions (Signed)
Nice to see you. Please try the ketoconazole cream for your rash.  If it is not beneficial or if it is too much effort to use the cream for this please let me know.

## 2022-01-19 NOTE — Assessment & Plan Note (Addendum)
Rash is consistent with a fungal ringworm infection. We will start ketoconazole 2% cream daily. Explained to patient that it may take weeks for the rash to resolve. Advised patient that her rash is contagious. Patient understands to not have any skin-to-skin contact with other members of her household and to not share bath towels. If there is no improvement she will contact us for an oral antifungal.  She will additionally call if she runs out of ketoconazole or if it is too much of an effort to apply a cream over such a wide area.

## 2022-01-19 NOTE — Progress Notes (Signed)
Patient seen along with medical student Zada Zhong.  I personally evaluated this patient along with the student, and verified all aspects of the history, physical exam, and medical decision making as documented by the student.  I agree with the student's documentation and have made all necessary edits.  Brianny Soulliere, MD  

## 2022-01-21 NOTE — Telephone Encounter (Signed)
Patient was seen by the provider about this.  Brinsley Wence,cma

## 2022-02-27 ENCOUNTER — Inpatient Hospital Stay
Admission: EM | Admit: 2022-02-27 | Discharge: 2022-03-05 | DRG: 871 | Disposition: A | Discharge status: Home Health | Payer: 59 | Attending: Internal Medicine | Admitting: Internal Medicine

## 2022-02-27 ENCOUNTER — Encounter: Payer: Self-pay | Admitting: Emergency Medicine

## 2022-02-27 ENCOUNTER — Other Ambulatory Visit: Payer: Self-pay

## 2022-02-27 ENCOUNTER — Emergency Department: Payer: 59

## 2022-02-27 DIAGNOSIS — E785 Hyperlipidemia, unspecified: Secondary | ICD-10-CM | POA: Diagnosis present

## 2022-02-27 DIAGNOSIS — R778 Other specified abnormalities of plasma proteins: Secondary | ICD-10-CM | POA: Diagnosis not present

## 2022-02-27 DIAGNOSIS — R42 Dizziness and giddiness: Secondary | ICD-10-CM

## 2022-02-27 DIAGNOSIS — I429 Cardiomyopathy, unspecified: Secondary | ICD-10-CM | POA: Diagnosis not present

## 2022-02-27 DIAGNOSIS — D696 Thrombocytopenia, unspecified: Secondary | ICD-10-CM | POA: Diagnosis present

## 2022-02-27 DIAGNOSIS — E119 Type 2 diabetes mellitus without complications: Secondary | ICD-10-CM | POA: Diagnosis present

## 2022-02-27 DIAGNOSIS — A4151 Sepsis due to Escherichia coli [E. coli]: Secondary | ICD-10-CM | POA: Diagnosis present

## 2022-02-27 DIAGNOSIS — E559 Vitamin D deficiency, unspecified: Secondary | ICD-10-CM | POA: Diagnosis present

## 2022-02-27 DIAGNOSIS — R7881 Bacteremia: Secondary | ICD-10-CM | POA: Diagnosis not present

## 2022-02-27 DIAGNOSIS — R0902 Hypoxemia: Secondary | ICD-10-CM | POA: Diagnosis not present

## 2022-02-27 DIAGNOSIS — N390 Urinary tract infection, site not specified: Secondary | ICD-10-CM | POA: Diagnosis present

## 2022-02-27 DIAGNOSIS — R7989 Other specified abnormal findings of blood chemistry: Secondary | ICD-10-CM

## 2022-02-27 DIAGNOSIS — E871 Hypo-osmolality and hyponatremia: Secondary | ICD-10-CM | POA: Diagnosis not present

## 2022-02-27 DIAGNOSIS — A419 Sepsis, unspecified organism: Secondary | ICD-10-CM | POA: Diagnosis not present

## 2022-02-27 DIAGNOSIS — U071 COVID-19: Secondary | ICD-10-CM | POA: Diagnosis not present

## 2022-02-27 DIAGNOSIS — Z882 Allergy status to sulfonamides status: Secondary | ICD-10-CM

## 2022-02-27 DIAGNOSIS — I951 Orthostatic hypotension: Secondary | ICD-10-CM | POA: Diagnosis not present

## 2022-02-27 DIAGNOSIS — I5023 Acute on chronic systolic (congestive) heart failure: Secondary | ICD-10-CM | POA: Diagnosis not present

## 2022-02-27 DIAGNOSIS — I5021 Acute systolic (congestive) heart failure: Secondary | ICD-10-CM | POA: Diagnosis not present

## 2022-02-27 DIAGNOSIS — E1165 Type 2 diabetes mellitus with hyperglycemia: Secondary | ICD-10-CM | POA: Diagnosis present

## 2022-02-27 DIAGNOSIS — Z7984 Long term (current) use of oral hypoglycemic drugs: Secondary | ICD-10-CM

## 2022-02-27 DIAGNOSIS — I110 Hypertensive heart disease with heart failure: Secondary | ICD-10-CM | POA: Diagnosis present

## 2022-02-27 DIAGNOSIS — I5020 Unspecified systolic (congestive) heart failure: Secondary | ICD-10-CM | POA: Diagnosis present

## 2022-02-27 DIAGNOSIS — I252 Old myocardial infarction: Secondary | ICD-10-CM

## 2022-02-27 DIAGNOSIS — R000 Tachycardia, unspecified: Secondary | ICD-10-CM | POA: Diagnosis not present

## 2022-02-27 DIAGNOSIS — N170 Acute kidney failure with tubular necrosis: Secondary | ICD-10-CM | POA: Diagnosis present

## 2022-02-27 DIAGNOSIS — Z833 Family history of diabetes mellitus: Secondary | ICD-10-CM

## 2022-02-27 DIAGNOSIS — E872 Acidosis, unspecified: Secondary | ICD-10-CM | POA: Diagnosis not present

## 2022-02-27 DIAGNOSIS — R0602 Shortness of breath: Secondary | ICD-10-CM | POA: Diagnosis not present

## 2022-02-27 DIAGNOSIS — I5043 Acute on chronic combined systolic (congestive) and diastolic (congestive) heart failure: Secondary | ICD-10-CM | POA: Diagnosis not present

## 2022-02-27 DIAGNOSIS — E669 Obesity, unspecified: Secondary | ICD-10-CM | POA: Diagnosis present

## 2022-02-27 DIAGNOSIS — R55 Syncope and collapse: Secondary | ICD-10-CM | POA: Diagnosis not present

## 2022-02-27 DIAGNOSIS — I420 Dilated cardiomyopathy: Secondary | ICD-10-CM

## 2022-02-27 DIAGNOSIS — U099 Post covid-19 condition, unspecified: Secondary | ICD-10-CM | POA: Diagnosis present

## 2022-02-27 DIAGNOSIS — N3000 Acute cystitis without hematuria: Secondary | ICD-10-CM | POA: Diagnosis not present

## 2022-02-27 DIAGNOSIS — I2489 Other forms of acute ischemic heart disease: Secondary | ICD-10-CM | POA: Diagnosis not present

## 2022-02-27 DIAGNOSIS — J9601 Acute respiratory failure with hypoxia: Secondary | ICD-10-CM | POA: Diagnosis not present

## 2022-02-27 DIAGNOSIS — Z880 Allergy status to penicillin: Secondary | ICD-10-CM

## 2022-02-27 DIAGNOSIS — J9811 Atelectasis: Secondary | ICD-10-CM | POA: Diagnosis present

## 2022-02-27 DIAGNOSIS — R6521 Severe sepsis with septic shock: Secondary | ICD-10-CM | POA: Diagnosis not present

## 2022-02-27 DIAGNOSIS — Z8249 Family history of ischemic heart disease and other diseases of the circulatory system: Secondary | ICD-10-CM | POA: Diagnosis not present

## 2022-02-27 DIAGNOSIS — R739 Hyperglycemia, unspecified: Secondary | ICD-10-CM | POA: Diagnosis not present

## 2022-02-27 DIAGNOSIS — B9620 Unspecified Escherichia coli [E. coli] as the cause of diseases classified elsewhere: Secondary | ICD-10-CM | POA: Diagnosis present

## 2022-02-27 DIAGNOSIS — I214 Non-ST elevation (NSTEMI) myocardial infarction: Secondary | ICD-10-CM

## 2022-02-27 DIAGNOSIS — I2510 Atherosclerotic heart disease of native coronary artery without angina pectoris: Secondary | ICD-10-CM | POA: Diagnosis present

## 2022-02-27 DIAGNOSIS — E118 Type 2 diabetes mellitus with unspecified complications: Secondary | ICD-10-CM | POA: Diagnosis not present

## 2022-02-27 DIAGNOSIS — I10 Essential (primary) hypertension: Secondary | ICD-10-CM | POA: Diagnosis present

## 2022-02-27 DIAGNOSIS — Z6833 Body mass index (BMI) 33.0-33.9, adult: Secondary | ICD-10-CM | POA: Diagnosis not present

## 2022-02-27 DIAGNOSIS — R6520 Severe sepsis without septic shock: Secondary | ICD-10-CM | POA: Diagnosis not present

## 2022-02-27 DIAGNOSIS — E876 Hypokalemia: Secondary | ICD-10-CM

## 2022-02-27 DIAGNOSIS — Z91040 Latex allergy status: Secondary | ICD-10-CM

## 2022-02-27 DIAGNOSIS — E869 Volume depletion, unspecified: Secondary | ICD-10-CM | POA: Diagnosis present

## 2022-02-27 DIAGNOSIS — Z79899 Other long term (current) drug therapy: Secondary | ICD-10-CM

## 2022-02-27 DIAGNOSIS — I959 Hypotension, unspecified: Secondary | ICD-10-CM | POA: Diagnosis not present

## 2022-02-27 LAB — RESP PANEL BY RT-PCR (RSV, FLU A&B, COVID)  RVPGX2
Influenza A by PCR: NEGATIVE
Influenza B by PCR: NEGATIVE
Resp Syncytial Virus by PCR: NEGATIVE
SARS Coronavirus 2 by RT PCR: POSITIVE — AB

## 2022-02-27 LAB — TROPONIN I (HIGH SENSITIVITY)
Troponin I (High Sensitivity): 29 ng/L — ABNORMAL HIGH (ref <18)
Troponin I (High Sensitivity): 110 ng/L (ref <18)

## 2022-02-27 LAB — BASIC METABOLIC PANEL
Anion gap: 13 (ref 5–15)
BUN: 16 mg/dL (ref 8–23)
CO2: 24 mmol/L (ref 22–32)
Calcium: 8.5 mg/dL — ABNORMAL LOW (ref 8.9–10.3)
Chloride: 94 mmol/L — ABNORMAL LOW (ref 98–111)
Creatinine, Ser: 0.89 mg/dL (ref 0.44–1.00)
GFR, Estimated: >60 mL/min (ref >60)
Glucose, Bld: 338 mg/dL — ABNORMAL HIGH (ref 70–99)
Potassium: 2.6 mmol/L — CL (ref 3.5–5.1)
Sodium: 131 mmol/L — ABNORMAL LOW (ref 135–145)

## 2022-02-27 LAB — CBC
HCT: 37.5 % (ref 36.0–46.0)
Hemoglobin: 12.2 g/dL (ref 12.0–15.0)
MCH: 28.4 pg (ref 26.0–34.0)
MCHC: 32.5 g/dL (ref 30.0–36.0)
MCV: 87.4 fL (ref 80.0–100.0)
Platelets: 146 10*3/uL — ABNORMAL LOW (ref 150–400)
RBC: 4.29 MIL/uL (ref 3.87–5.11)
RDW: 17.8 % — ABNORMAL HIGH (ref 11.5–15.5)
WBC: 5.8 10*3/uL (ref 4.0–10.5)
nRBC: 0 % (ref 0.0–0.2)

## 2022-02-27 LAB — MAGNESIUM: Magnesium: 1.7 mg/dL (ref 1.7–2.4)

## 2022-02-27 MED ORDER — LACTATED RINGERS IV BOLUS
1000.0000 mL | Freq: Once | INTRAVENOUS | Status: AC
  Administered 2022-02-27: 1000 mL via INTRAVENOUS

## 2022-02-27 MED ORDER — ASPIRIN 81 MG PO CHEW
324.0000 mg | CHEWABLE_TABLET | Freq: Once | ORAL | Status: AC
  Administered 2022-02-28: 324 mg via ORAL
  Filled 2022-02-27: qty 4

## 2022-02-27 MED ORDER — POTASSIUM CHLORIDE CRYS ER 20 MEQ PO TBCR: 40.0000 meq | EXTENDED_RELEASE_TABLET | Freq: Once | ORAL | Status: DC

## 2022-02-27 MED ORDER — POTASSIUM CHLORIDE 20 MEQ PO PACK
40.0000 meq | PACK | Freq: Every day | ORAL | Status: DC
  Administered 2022-02-27 – 2022-03-04 (×6): 40 meq via ORAL
  Filled 2022-02-27 (×8): qty 2

## 2022-02-27 MED ORDER — ACETAMINOPHEN 500 MG PO TABS
1000.0000 mg | ORAL_TABLET | Freq: Once | ORAL | Status: AC
  Administered 2022-02-27: 1000 mg via ORAL
  Filled 2022-02-27: qty 2

## 2022-02-27 MED ORDER — POTASSIUM CHLORIDE 10 MEQ/100ML IV SOLN
10.0000 meq | INTRAVENOUS | Status: AC
  Administered 2022-02-27 (×2): 10 meq via INTRAVENOUS
  Filled 2022-02-27 (×2): qty 100

## 2022-02-27 MED ORDER — POTASSIUM CHLORIDE 20 MEQ PO PACK
40.0000 meq | PACK | Freq: Once | ORAL | Status: AC
  Administered 2022-02-27: 40 meq via ORAL
  Filled 2022-02-27: qty 2

## 2022-02-27 NOTE — ED Triage Notes (Signed)
Patient to ED via [facility] from home for dizziness with sudden onset. Daughter states patient was confused at that time. Currently Aox3.   87% RA with EMS initially 417 cbg 142 HR 99.5  20 R hand- 500 mL NaCl

## 2022-02-27 NOTE — ED Provider Notes (Signed)
[facility] Provider Note    Event Date/Time   First MD Initiated Contact with Patient 02/27/22 2101     (approximate)   History   Dizziness   HPI  Erika Perry is a 72 y.o. female who comes in for sudden onset of dizziness from home.  Patient daughter does report that she was confused at the time as well.  Initial oxygen was 87%.  Patient given 500 cc of fluid..  Patient reports that she was feeling a little bit of dizziness around 5:00 mostly when she stood up.  She reported feeling some chills but denies any no known fever or cough.  Denies any abdominal pain.  She reports that when she is laying still that she has no symptoms it was only when she was up and walking that she felt like she was going to pass out.  She denies any headaches, falls or hitting her head or any other concerns.   Physical Exam   Triage Vital Signs: ED Triage Vitals  Enc Vitals Group     BP 02/27/22 1808 106/65     Pulse Rate 02/27/22 1808 (!) 128     Resp 02/27/22 1808 18     Temp 02/27/22 1808 100.1 F (37.8 C)     Temp Source 02/27/22 1808 Oral     SpO2 02/27/22 1808 95 %     Weight --      Height --      Head Circumference --      Peak Flow --      Pain Score 02/27/22 1806 0     Pain Loc --      Pain Edu? --      Excl. in [place]? --     Most recent vital signs: Vitals:   02/27/22 1808  BP: 106/65  Pulse: (!) 128  Resp: 18  Temp: 100.1 F (37.8 C)  SpO2: 95%     General: Awake, no distress.  CV:  Good peripheral perfusion.  Resp:  Normal effort.  Abd:  No distention.  Soft and nontender Other:  Cranial nerves II to XII are intact.  Equal strength in arms and legs.  Heel-to-shin intact.  Unable to do finger-to-nose due to chronic issues moving the right arm secondary to prior elbow fracture.   ED Results / Procedures / Treatments   Labs (all labs ordered are listed, but only abnormal results are displayed) Labs Reviewed  BASIC METABOLIC PANEL -  Abnormal; Notable for the following components:      Result Value   Sodium 131 (*)    Potassium 2.6 (*)    Chloride 94 (*)    Glucose, Bld 338 (*)    Calcium 8.5 (*)    All other components within normal limits  CBC - Abnormal; Notable for the following components:   RDW 17.8 (*)    Platelets 146 (*)    All other components within normal limits  URINALYSIS, ROUTINE W REFLEX MICROSCOPIC  CBG MONITORING, ED     EKG  My interpretation of EKG:  Sinus tachycardia rate of 126 without any ST elevation or T wave inversions, normal intervals  RADIOLOGY I have reviewed the xray personally and interpretted and no PNA  IMPRESSION: 1. No acute cardiopulmonary process. 2. Faint focal area of increased density in the right suprahilar region. Follow-up with chest radiograph or CT on a nonemergent/outpatient basis recommended.   PROCEDURES:  Critical Care performed: No  .1-3 Lead EKG Interpretation  Performed by: Vanessa Ralston, MD Authorized by: Vanessa [place], MD     Interpretation: normal     ECG rate:  90   ECG rate assessment: normal     Rhythm: sinus rhythm     Ectopy: none     Conduction: normal      MEDICATIONS ORDERED IN ED: Medications  potassium chloride SA (KLOR-CON M) CR tablet 40 mEq (has no administration in time range)  potassium chloride 10 mEq in 100 mL IVPB (has no administration in time range)     IMPRESSION / MDM / ASSESSMENT AND PLAN / ED COURSE  I reviewed the triage vital signs and the nursing notes.   Patient's presentation is most consistent with acute presentation with potential threat to life or bodily function.   Patient comes in with concerns for dizziness slightly low blood pressures and some tachycardia already got 500 cc of fluid.  Will check orthostatics, add on labs to evaluate for COVID, ACS.  Urine evaluate for UTI.  BMP shows low potassium at 2.6.  Will give some IV and oral repletion.  Glucose is 338 but patient has normal anion gap  no signs of DKA.  Her CBC is normal.  Patient had + orthostatics will give 1 L of fluid.  Patient is feeling better denies any dizziness.  She has been ambulatory with sats that have stayed above 90% but her troponin has risen.  She denied any chest pain just states that onset dizziness.  Could just be from the hypotension but given patient's age will load with aspirin and recommended that she be admitted for cardiac monitoring, repeat troponins etc.  Incidental findings on x-ray given to patient for follow-up  The patient is on the cardiac monitor to evaluate for evidence of arrhythmia and/or significant heart rate changes.      FINAL CLINICAL IMPRESSION(S) / ED DIAGNOSES   Final diagnoses:  COVID-19  Hypokalemia  Elevated troponin     Rx / DC Orders   ED Discharge Orders     None        Note:  This document was prepared using Dragon voice recognition software and may include unintentional dictation errors.   Vanessa [place], MD 02/28/22 [phone]

## 2022-02-27 NOTE — ED Notes (Signed)
This RN slowed down K+ due to it burning the patient hand.

## 2022-02-27 NOTE — ED Notes (Signed)
Pt ambulated in room. Pt oxygen remained around 90--4% on RA during ambulation. Pt denies dizziness.

## 2022-02-27 NOTE — ED Notes (Signed)
Pt removed from oxygen to assess oxygen sats. Pt is 95-98 % on RA at this time.

## 2022-02-28 ENCOUNTER — Observation Stay (HOSPITAL_COMMUNITY)
Admit: 2022-02-28 | Discharge: 2022-02-28 | Disposition: A | Discharge status: Home / Self Care | Payer: 59 | Attending: Internal Medicine | Admitting: Internal Medicine

## 2022-02-28 ENCOUNTER — Observation Stay: Payer: 59

## 2022-02-28 DIAGNOSIS — I2510 Atherosclerotic heart disease of native coronary artery without angina pectoris: Secondary | ICD-10-CM | POA: Diagnosis present

## 2022-02-28 DIAGNOSIS — N390 Urinary tract infection, site not specified: Secondary | ICD-10-CM | POA: Diagnosis present

## 2022-02-28 DIAGNOSIS — E785 Hyperlipidemia, unspecified: Secondary | ICD-10-CM | POA: Diagnosis present

## 2022-02-28 DIAGNOSIS — Z8249 Family history of ischemic heart disease and other diseases of the circulatory system: Secondary | ICD-10-CM | POA: Diagnosis not present

## 2022-02-28 DIAGNOSIS — E871 Hypo-osmolality and hyponatremia: Secondary | ICD-10-CM | POA: Diagnosis not present

## 2022-02-28 DIAGNOSIS — R7881 Bacteremia: Secondary | ICD-10-CM | POA: Diagnosis not present

## 2022-02-28 DIAGNOSIS — I2489 Other forms of acute ischemic heart disease: Secondary | ICD-10-CM

## 2022-02-28 DIAGNOSIS — N3000 Acute cystitis without hematuria: Secondary | ICD-10-CM | POA: Diagnosis not present

## 2022-02-28 DIAGNOSIS — I5021 Acute systolic (congestive) heart failure: Secondary | ICD-10-CM | POA: Diagnosis not present

## 2022-02-28 DIAGNOSIS — A4151 Sepsis due to Escherichia coli [E. coli]: Secondary | ICD-10-CM | POA: Diagnosis present

## 2022-02-28 DIAGNOSIS — I252 Old myocardial infarction: Secondary | ICD-10-CM | POA: Diagnosis not present

## 2022-02-28 DIAGNOSIS — R55 Syncope and collapse: Secondary | ICD-10-CM | POA: Diagnosis not present

## 2022-02-28 DIAGNOSIS — A419 Sepsis, unspecified organism: Secondary | ICD-10-CM

## 2022-02-28 DIAGNOSIS — R42 Dizziness and giddiness: Secondary | ICD-10-CM | POA: Diagnosis present

## 2022-02-28 DIAGNOSIS — R7989 Other specified abnormal findings of blood chemistry: Secondary | ICD-10-CM | POA: Diagnosis not present

## 2022-02-28 DIAGNOSIS — I110 Hypertensive heart disease with heart failure: Secondary | ICD-10-CM | POA: Diagnosis present

## 2022-02-28 DIAGNOSIS — E872 Acidosis, unspecified: Secondary | ICD-10-CM | POA: Diagnosis present

## 2022-02-28 DIAGNOSIS — E669 Obesity, unspecified: Secondary | ICD-10-CM | POA: Diagnosis present

## 2022-02-28 DIAGNOSIS — E118 Type 2 diabetes mellitus with unspecified complications: Secondary | ICD-10-CM

## 2022-02-28 DIAGNOSIS — I214 Non-ST elevation (NSTEMI) myocardial infarction: Secondary | ICD-10-CM | POA: Diagnosis not present

## 2022-02-28 DIAGNOSIS — J9601 Acute respiratory failure with hypoxia: Secondary | ICD-10-CM | POA: Diagnosis present

## 2022-02-28 DIAGNOSIS — D696 Thrombocytopenia, unspecified: Secondary | ICD-10-CM | POA: Diagnosis present

## 2022-02-28 DIAGNOSIS — E876 Hypokalemia: Secondary | ICD-10-CM | POA: Diagnosis present

## 2022-02-28 DIAGNOSIS — I5023 Acute on chronic systolic (congestive) heart failure: Secondary | ICD-10-CM | POA: Diagnosis not present

## 2022-02-28 DIAGNOSIS — U071 COVID-19: Secondary | ICD-10-CM | POA: Diagnosis present

## 2022-02-28 DIAGNOSIS — E559 Vitamin D deficiency, unspecified: Secondary | ICD-10-CM | POA: Diagnosis present

## 2022-02-28 DIAGNOSIS — I5043 Acute on chronic combined systolic (congestive) and diastolic (congestive) heart failure: Secondary | ICD-10-CM | POA: Diagnosis not present

## 2022-02-28 DIAGNOSIS — J9811 Atelectasis: Secondary | ICD-10-CM | POA: Diagnosis present

## 2022-02-28 DIAGNOSIS — E1165 Type 2 diabetes mellitus with hyperglycemia: Secondary | ICD-10-CM | POA: Diagnosis present

## 2022-02-28 DIAGNOSIS — I429 Cardiomyopathy, unspecified: Secondary | ICD-10-CM | POA: Diagnosis not present

## 2022-02-28 DIAGNOSIS — R6521 Severe sepsis with septic shock: Secondary | ICD-10-CM | POA: Diagnosis present

## 2022-02-28 DIAGNOSIS — I420 Dilated cardiomyopathy: Secondary | ICD-10-CM | POA: Diagnosis present

## 2022-02-28 DIAGNOSIS — I959 Hypotension, unspecified: Secondary | ICD-10-CM

## 2022-02-28 DIAGNOSIS — R6520 Severe sepsis without septic shock: Secondary | ICD-10-CM

## 2022-02-28 DIAGNOSIS — Z6833 Body mass index (BMI) 33.0-33.9, adult: Secondary | ICD-10-CM | POA: Diagnosis not present

## 2022-02-28 DIAGNOSIS — I951 Orthostatic hypotension: Secondary | ICD-10-CM | POA: Diagnosis present

## 2022-02-28 DIAGNOSIS — N170 Acute kidney failure with tubular necrosis: Secondary | ICD-10-CM | POA: Diagnosis present

## 2022-02-28 LAB — ECHOCARDIOGRAM COMPLETE
AR max vel: 1.88 cm2
AV Area VTI: 1.83 cm2
AV Area mean vel: 1.79 cm2
AV Mean grad: 5.5 mmHg
AV Peak grad: 9.5 mmHg
Ao pk vel: 1.54 m/s
Area-P 1/2: 5.88 cm2
Calc EF: 41.3 %
MV M vel: 3.77 m/s
MV Peak grad: 56.9 mmHg
MV VTI: 2.15 cm2
Radius: 0.4 cm
S' Lateral: 4.3 cm
Single Plane A2C EF: 44.7 %
Single Plane A4C EF: 35.3 %
Weight: 2960 oz

## 2022-02-28 LAB — BLOOD CULTURE ID PANEL (REFLEXED) - BCID2

## 2022-02-28 LAB — BASIC METABOLIC PANEL
Anion gap: 13 (ref 5–15)
BUN: 19 mg/dL (ref 8–23)
CO2: 23 mmol/L (ref 22–32)
Calcium: 8.2 mg/dL — ABNORMAL LOW (ref 8.9–10.3)
Chloride: 97 mmol/L — ABNORMAL LOW (ref 98–111)
Creatinine, Ser: 1.15 mg/dL — ABNORMAL HIGH (ref 0.44–1.00)
GFR, Estimated: 51 mL/min — ABNORMAL LOW (ref >60)
Glucose, Bld: 230 mg/dL — ABNORMAL HIGH (ref 70–99)
Potassium: 4 mmol/L (ref 3.5–5.1)
Sodium: 133 mmol/L — ABNORMAL LOW (ref 135–145)

## 2022-02-28 LAB — HEMOGLOBIN A1C
Hgb A1c MFr Bld: 9.2 % — ABNORMAL HIGH (ref 4.8–5.6)
Mean Plasma Glucose: 217.34 mg/dL

## 2022-02-28 LAB — URINALYSIS, ROUTINE W REFLEX MICROSCOPIC
Bilirubin Urine: NEGATIVE
Glucose, UA: 50 mg/dL — AB
Ketones, ur: NEGATIVE mg/dL
Nitrite: NEGATIVE
Protein, ur: >=300 mg/dL — AB
RBC / HPF: >50 RBC/hpf — ABNORMAL HIGH (ref 0–5)
Specific Gravity, Urine: 1.007 (ref 1.005–1.030)
WBC, UA: >50 WBC/hpf — ABNORMAL HIGH (ref 0–5)
pH: 5 (ref 5.0–8.0)

## 2022-02-28 LAB — HIV ANTIBODY (ROUTINE TESTING W REFLEX): HIV Screen 4th Generation wRfx: NONREACTIVE

## 2022-02-28 LAB — CBC
HCT: 32.7 % — ABNORMAL LOW (ref 36.0–46.0)
Hemoglobin: 10.5 g/dL — ABNORMAL LOW (ref 12.0–15.0)
MCH: 28.2 pg (ref 26.0–34.0)
MCHC: 32.1 g/dL (ref 30.0–36.0)
MCV: 87.9 fL (ref 80.0–100.0)
Platelets: 139 10*3/uL — ABNORMAL LOW (ref 150–400)
RBC: 3.72 MIL/uL — ABNORMAL LOW (ref 3.87–5.11)
RDW: 18 % — ABNORMAL HIGH (ref 11.5–15.5)
WBC: 9.6 10*3/uL (ref 4.0–10.5)
nRBC: 0 % (ref 0.0–0.2)

## 2022-02-28 LAB — TROPONIN I (HIGH SENSITIVITY)
Troponin I (High Sensitivity): 135 ng/L (ref <18)
Troponin I (High Sensitivity): 130 ng/L (ref <18)

## 2022-02-28 LAB — CBG MONITORING, ED
Glucose-Capillary: 166 mg/dL — ABNORMAL HIGH (ref 70–99)
Glucose-Capillary: 221 mg/dL — ABNORMAL HIGH (ref 70–99)
Glucose-Capillary: 231 mg/dL — ABNORMAL HIGH (ref 70–99)
Glucose-Capillary: 242 mg/dL — ABNORMAL HIGH (ref 70–99)
Glucose-Capillary: 244 mg/dL — ABNORMAL HIGH (ref 70–99)

## 2022-02-28 LAB — CREATININE, SERUM
Creatinine, Ser: 1.15 mg/dL — ABNORMAL HIGH (ref 0.44–1.00)
GFR, Estimated: 51 mL/min — ABNORMAL LOW (ref >60)

## 2022-02-28 LAB — LACTIC ACID, PLASMA
Lactic Acid, Venous: 6.7 mmol/L (ref 0.5–1.9)
Lactic Acid, Venous: 2.6 mmol/L (ref 0.5–1.9)

## 2022-02-28 LAB — PROCALCITONIN: Procalcitonin: 34.62 ng/mL

## 2022-02-28 MED ORDER — SODIUM CHLORIDE 0.9 % IV SOLN
500.0000 mg | INTRAVENOUS | Status: DC
  Administered 2022-02-28 – 2022-03-01 (×2): 500 mg via INTRAVENOUS
  Filled 2022-02-28 (×2): qty 5

## 2022-02-28 MED ORDER — ALBUTEROL SULFATE (2.5 MG/3ML) 0.083% IN NEBU
2.5000 mg | INHALATION_SOLUTION | Freq: Four times a day (QID) | RESPIRATORY_TRACT | Status: DC | PRN
  Administered 2022-02-28: 2.5 mg via RESPIRATORY_TRACT
  Filled 2022-02-28: qty 3

## 2022-02-28 MED ORDER — INSULIN ASPART 100 UNIT/ML IJ SOLN
0.0000 [IU] | Freq: Three times a day (TID) | INTRAMUSCULAR | Status: DC
  Administered 2022-02-28: 3 [IU] via SUBCUTANEOUS
  Administered 2022-02-28 (×2): 5 [IU] via SUBCUTANEOUS
  Administered 2022-03-01: 2 [IU] via SUBCUTANEOUS
  Administered 2022-03-01: 5 [IU] via SUBCUTANEOUS
  Administered 2022-03-01: 3 [IU] via SUBCUTANEOUS
  Administered 2022-03-02: 2 [IU] via SUBCUTANEOUS
  Administered 2022-03-02: 3 [IU] via SUBCUTANEOUS
  Administered 2022-03-02 – 2022-03-05 (×6): 2 [IU] via SUBCUTANEOUS
  Filled 2022-02-28 (×14): qty 1

## 2022-02-28 MED ORDER — ONDANSETRON HCL 4 MG/2ML IJ SOLN
4.0000 mg | Freq: Four times a day (QID) | INTRAMUSCULAR | Status: DC | PRN
  Administered 2022-02-28 – 2022-03-01 (×3): 4 mg via INTRAVENOUS
  Filled 2022-02-28 (×3): qty 2

## 2022-02-28 MED ORDER — ENOXAPARIN SODIUM 40 MG/0.4ML IJ SOSY
0.5000 mg/kg | PREFILLED_SYRINGE | INTRAMUSCULAR | Status: DC
  Administered 2022-02-28 – 2022-03-01 (×2): 42.5 mg via SUBCUTANEOUS
  Filled 2022-02-28 (×2): qty 0.8

## 2022-02-28 MED ORDER — SODIUM CHLORIDE 0.9 % IV BOLUS
500.0000 mL | Freq: Once | INTRAVENOUS | Status: AC
  Administered 2022-02-28: 500 mL via INTRAVENOUS

## 2022-02-28 MED ORDER — SODIUM CHLORIDE 0.9 % IV SOLN: 250.0000 mL | INTRAVENOUS | Status: DC

## 2022-02-28 MED ORDER — LACTATED RINGERS IV BOLUS
500.0000 mL | Freq: Once | INTRAVENOUS | Status: AC
  Administered 2022-02-28: 500 mL via INTRAVENOUS

## 2022-02-28 MED ORDER — HYDROCODONE-ACETAMINOPHEN 5-325 MG PO TABS: 1.0000 | ORAL_TABLET | ORAL | Status: DC | PRN

## 2022-02-28 MED ORDER — LACTATED RINGERS IV SOLN: INTRAVENOUS | Status: AC

## 2022-02-28 MED ORDER — ACETAMINOPHEN 650 MG RE SUPP: 650.0000 mg | Freq: Four times a day (QID) | RECTAL | Status: DC | PRN

## 2022-02-28 MED ORDER — NOREPINEPHRINE 4 MG/250ML-% IV SOLN
2.0000 ug/min | INTRAVENOUS | Status: DC
  Administered 2022-02-28: 2 ug/min via INTRAVENOUS
  Filled 2022-02-28: qty 250

## 2022-02-28 MED ORDER — SODIUM CHLORIDE 0.9 % IV SOLN
12.5000 mg | Freq: Four times a day (QID) | INTRAVENOUS | Status: AC | PRN
  Administered 2022-02-28: 12.5 mg via INTRAVENOUS
  Filled 2022-02-28: qty 12.5

## 2022-02-28 MED ORDER — SODIUM CHLORIDE 0.9 % IV SOLN
1.0000 g | Freq: Once | INTRAVENOUS | Status: AC
  Administered 2022-02-28: 1 g via INTRAVENOUS
  Filled 2022-02-28: qty 10

## 2022-02-28 MED ORDER — SODIUM CHLORIDE 0.9 % IV SOLN
2.0000 g | INTRAVENOUS | Status: AC
  Administered 2022-03-01 – 2022-03-03 (×3): 2 g via INTRAVENOUS
  Filled 2022-02-28 (×3): qty 20

## 2022-02-28 MED ORDER — SODIUM CHLORIDE 0.9% FLUSH
3.0000 mL | Freq: Two times a day (BID) | INTRAVENOUS | Status: DC
  Administered 2022-02-28 – 2022-03-05 (×8): 3 mL via INTRAVENOUS

## 2022-02-28 MED ORDER — INSULIN ASPART 100 UNIT/ML IJ SOLN
0.0000 [IU] | Freq: Every day | INTRAMUSCULAR | Status: DC
  Administered 2022-02-28: 2 [IU] via SUBCUTANEOUS
  Filled 2022-02-28: qty 1

## 2022-02-28 MED ORDER — LACTATED RINGERS IV BOLUS
1000.0000 mL | Freq: Once | INTRAVENOUS | Status: AC
  Administered 2022-02-28: 1000 mL via INTRAVENOUS

## 2022-02-28 MED ORDER — PERFLUTREN LIPID MICROSPHERE
1.0000 mL | INTRAVENOUS | Status: AC | PRN
  Administered 2022-02-28: 3 mL via INTRAVENOUS

## 2022-02-28 MED ORDER — ROSUVASTATIN CALCIUM 10 MG PO TABS
20.0000 mg | ORAL_TABLET | Freq: Every day | ORAL | Status: DC
  Administered 2022-02-28 – 2022-03-05 (×6): 20 mg via ORAL
  Filled 2022-02-28 (×2): qty 1
  Filled 2022-02-28: qty 2
  Filled 2022-02-28: qty 1
  Filled 2022-02-28 (×2): qty 2

## 2022-02-28 MED ORDER — MIDODRINE HCL 5 MG PO TABS
5.0000 mg | ORAL_TABLET | Freq: Three times a day (TID) | ORAL | Status: DC
  Administered 2022-02-28 – 2022-03-01 (×4): 5 mg via ORAL
  Filled 2022-02-28 (×4): qty 1

## 2022-02-28 MED ORDER — PROCHLORPERAZINE EDISYLATE 10 MG/2ML IJ SOLN
10.0000 mg | Freq: Once | INTRAMUSCULAR | Status: AC
  Administered 2022-02-28: 10 mg via INTRAVENOUS
  Filled 2022-02-28: qty 2

## 2022-02-28 MED ORDER — SODIUM CHLORIDE 0.9 % IV SOLN
1.0000 g | INTRAVENOUS | Status: DC
  Administered 2022-02-28: 1 g via INTRAVENOUS
  Filled 2022-02-28: qty 10

## 2022-02-28 MED ORDER — NOREPINEPHRINE 4 MG/250ML-% IV SOLN: 0.0000 ug/min | INTRAVENOUS | Status: DC

## 2022-02-28 MED ORDER — ACETAMINOPHEN 325 MG PO TABS
650.0000 mg | ORAL_TABLET | Freq: Four times a day (QID) | ORAL | Status: DC | PRN
  Administered 2022-02-28: 650 mg via ORAL
  Filled 2022-02-28: qty 2

## 2022-02-28 MED ORDER — MORPHINE SULFATE (PF) 2 MG/ML IV SOLN: 2.0000 mg | INTRAVENOUS | Status: DC | PRN

## 2022-02-28 MED ORDER — IOHEXOL 350 MG/ML SOLN
75.0000 mL | Freq: Once | INTRAVENOUS | Status: AC | PRN
  Administered 2022-02-28: 75 mL via INTRAVENOUS

## 2022-02-28 NOTE — Progress Notes (Signed)
PHARMACY - PHYSICIAN COMMUNICATION CRITICAL VALUE ALERT - BLOOD CULTURE IDENTIFICATION (BCID)  Erika Perry is an 72 y.o. female who presented to [name] on 02/27/2022 with a chief complaint of sepsis.  Assessment:  3/4 bottles GNR. BCID detected E coli. Suspect urinary source.  Name of physician (or Provider) Contacted: Dr. Mal Misty  Current antibiotics: Ceftriaxone 1 g IV q24h + azithromycin 500 mg IV q24h  Changes to prescribed antibiotics recommended:  Increase ceftriaxone to 2 g for bacteremia  Results for orders placed or performed during the hospital encounter of 02/27/22  Blood Culture ID Panel (Reflexed) (Collected: 02/28/2022 12:31 AM)  Result Value Ref Range   Enterococcus faecalis NOT DETECTED NOT DETECTED   Enterococcus Faecium NOT DETECTED NOT DETECTED   Listeria monocytogenes NOT DETECTED NOT DETECTED   Staphylococcus species NOT DETECTED NOT DETECTED   Staphylococcus aureus (BCID) NOT DETECTED NOT DETECTED   Staphylococcus epidermidis NOT DETECTED NOT DETECTED   Staphylococcus lugdunensis NOT DETECTED NOT DETECTED   Streptococcus species NOT DETECTED NOT DETECTED   Streptococcus agalactiae NOT DETECTED NOT DETECTED   Streptococcus pneumoniae NOT DETECTED NOT DETECTED   Streptococcus pyogenes NOT DETECTED NOT DETECTED   A.calcoaceticus-baumannii NOT DETECTED NOT DETECTED   Bacteroides fragilis NOT DETECTED NOT DETECTED   Enterobacterales DETECTED (A) NOT DETECTED   Enterobacter cloacae complex NOT DETECTED NOT DETECTED   Escherichia coli DETECTED (A) NOT DETECTED   Klebsiella aerogenes NOT DETECTED NOT DETECTED   Klebsiella oxytoca NOT DETECTED NOT DETECTED   Klebsiella pneumoniae NOT DETECTED NOT DETECTED   Proteus species NOT DETECTED NOT DETECTED   Salmonella species NOT DETECTED NOT DETECTED   Serratia marcescens NOT DETECTED NOT DETECTED   Haemophilus influenzae NOT DETECTED NOT DETECTED   Neisseria meningitidis NOT DETECTED NOT DETECTED   Pseudomonas  aeruginosa NOT DETECTED NOT DETECTED   Stenotrophomonas maltophilia NOT DETECTED NOT DETECTED   Candida albicans NOT DETECTED NOT DETECTED   Candida auris NOT DETECTED NOT DETECTED   Candida glabrata NOT DETECTED NOT DETECTED   Candida krusei NOT DETECTED NOT DETECTED   Candida parapsilosis NOT DETECTED NOT DETECTED   Candida tropicalis NOT DETECTED NOT DETECTED   Cryptococcus neoformans/gattii NOT DETECTED NOT DETECTED   CTX-M ESBL NOT DETECTED NOT DETECTED   Carbapenem resistance IMP NOT DETECTED NOT DETECTED   Carbapenem resistance KPC NOT DETECTED NOT DETECTED   Carbapenem resistance NDM NOT DETECTED NOT DETECTED   Carbapenem resist OXA 48 LIKE NOT DETECTED NOT DETECTED   Carbapenem resistance VIM NOT DETECTED NOT DETECTED    Benita Gutter 02/28/2022  1:00 PM

## 2022-02-28 NOTE — Sepsis Progress Note (Signed)
Following for sepsis monitoring

## 2022-02-28 NOTE — Consult Note (Signed)
Cardiology Consultation:   Patient ID: Erika Perry; [id number]; 06-27-1950   Admit date: 02/27/2022 Date of Consult: 02/28/2022  Primary Care Provider: Leone Haven, MD Primary Cardiologist: new - consult by [facility] Primary Electrophysiologist:  None   Patient Profile:   Erika Perry is a 72 y.o. female with a hx of uncontrolled DM, HTN, and HLD who was admitted with severe sepsis secondary to Covid infection and UTI and is being seen today for the evaluation of mildly elevated high sensitivity troponin at the request of Dr. Damita Dunnings.  History of Present Illness:   [name] has no previously known cardiac history.  She presented to [facility] ED on the evening of 02/27/2022 with positional dizziness and 1 week history of cough and SOB with associated chills without fever.  No angina, palpitations, or syncope.  She was found to be COVID-positive.  BP has ranged from the 80s to 1 teens systolic.  Heart rate in the 70s to low 100s bpm.  Oxygen saturation 91 to 100% on room air.  Tmax 100.1.  Initial high-sensitivity troponin 29 with a delta troponin of 110, currently trending to 135.  Lactic acid 6.7 improving to 2.6.  Procalcitonin 34.62.  Sodium 131 trending to 133, potassium 2.6 trending to 4.0, glucose 338, serum creatinine 1.15.  Magnesium 1.7.  WBC 5.8, Hgb 12.2, PLT 146.  Blood culture no growth to date x 2.  Chest x-ray showed no acute cardiopulmonary process with a faint focal area of increased density in the right suprahilar region.  CTA chest was negative for PE with moderate coronary artery calcification in the LAD as well as scattered bilateral groundglass opacities and aortic atherosclerosis being noted.  EKG showed sinus tachycardia, 126 bpm, low voltage QRS, cannot exclude prior anterior infarct, baseline artifact with nonspecific inferior ST-T changes.  In the ED, she was given ASA 324 mg x 1, azithromycin, Rocephin, acetaminophen, lactated Ringer's 1.5 L, and potassium repletion.   She has been admitted with severe sepsis secondary to COVID infection.  Cardiology asked to evaluate mildly elevated high-sensitivity troponin. Currently, without chest pain with continued SOB.    Past Medical History:  Diagnosis Date   BMI 35.0-35.9,adult 07/17/2020   Closed fracture of right distal humerus 07/12/2021   HLD (hyperlipidemia)    Hypertension    Pre-diabetes    Vitamin D deficiency 07/13/2021    Past Surgical History:  Procedure Laterality Date   CESAREAN SECTION  1981, 1990   CLOSED REDUCTION ELBOW FRACTURE Right 09/29/2021   Procedure: CLOSED MANIPULATION ELBOW;  Surgeon: Shona Needles, MD;  Location: [place];  Service: Orthopedics;  Laterality: Right;   ORIF HUMERUS FRACTURE Right 07/11/2021   Procedure: OPEN REDUCTION INTERNAL FIXATION (ORIF) DISTAL HUMERUS FRACTURE;  Surgeon: Shona Needles, MD;  Location: [place];  Service: Orthopedics;  Laterality: Right;   TONSILLECTOMY       Home Meds: Prior to Admission medications   Medication Sig Start Date End Date Taking? Authorizing Provider  hydrochlorothiazide (MICROZIDE) 12.5 MG capsule Take 1 capsule (12.5 mg total) by mouth daily. 09/17/21  Yes Leone Haven, MD  ketoconazole (NIZORAL) 2 % cream Apply 1 Application topically daily. 01/19/22  Yes Leone Haven, MD  losartan (COZAAR) 50 MG tablet Take 1 tablet (50 mg total) by mouth daily. 09/17/21  Yes Leone Haven, MD  metFORMIN (GLUCOPHAGE) 500 MG tablet Take 1 tablet (500 mg total) by mouth 2 (two) times daily with a meal. 12/29/21  Yes Sonnenberg,  Angela Adam, MD  rosuvastatin (CRESTOR) 20 MG tablet Take 1 tablet (20 mg total) by mouth daily. 12/30/21  Yes Leone Haven, MD  atorvastatin (LIPITOR) 10 MG tablet Take 1 tablet (10 mg total) by mouth daily. 07/18/18 10/23/19  Jodelle Green, FNP    Inpatient Medications: Scheduled Meds:  enoxaparin (LOVENOX) injection  0.5 mg/kg Subcutaneous Q24H   insulin aspart  0-15 Units Subcutaneous TID WC   insulin  aspart  0-5 Units Subcutaneous QHS   potassium chloride  40 mEq Oral Daily   rosuvastatin  20 mg Oral Daily   sodium chloride flush  3 mL Intravenous Q12H   Continuous Infusions:  azithromycin Stopped (02/28/22 0453)   cefTRIAXone (ROCEPHIN)  IV Stopped (02/28/22 0332)   lactated ringers 150 mL/hr at 02/28/22 0207   PRN Meds: acetaminophen **OR** acetaminophen, HYDROcodone-acetaminophen, morphine injection  Allergies:   Allergies  Allergen Reactions   Latex     Pt states that she is allergic to latex in pre-op.  Regardless of allergy type latex will not be used.    Bactrim [Sulfamethoxazole-Trimethoprim]     Nausea, no appitite   Penicillins     Tolerated Cephalosporin Date: 07/11/2021.      Social History:   Social History   Socioeconomic History   Marital status: Widowed    Spouse name: Not on file   Number of children: Not on file   Years of education: Not on file   Highest education level: Not on file  Occupational History   Not on file  Tobacco Use   Smoking status: Never   Smokeless tobacco: Never  Vaping Use   Vaping Use: Never used  Substance and Sexual Activity   Alcohol use: No   Drug use: Never   Sexual activity: Not Currently  Other Topics Concern   Not on file  Social History Narrative   Not on file   Social Determinants of Health   Financial Resource Strain: Low Risk  (08/07/2021)   Overall Financial Resource Strain (CARDIA)    Difficulty of Paying Living Expenses: Not hard at all  Food Insecurity: No Food Insecurity (08/07/2021)   Hunger Vital Sign    Worried About Running Out of Food in the Last Year: Never true    [place] in the Last Year: Never true  Transportation Needs: No Transportation Needs (08/07/2021)   PRAPARE - Hydrologist (Medical): No    Lack of Transportation (Non-Medical): No  Physical Activity: Not on file  Stress: No Stress Concern Present (08/07/2021)   [place]    Feeling of Stress : Not at all  Social Connections: Unknown (08/07/2021)   Social Connection and Isolation Panel [NHANES]    Frequency of Communication with Friends and Family: More than three times a week    Frequency of Social Gatherings with Friends and Family: More than three times a week    Attends Religious Services: Not on file    Active Member of Clubs or Organizations: Not on file    Attends Archivist Meetings: Not on file    Marital Status: Widowed  Intimate Partner Violence: Not At Risk (08/07/2021)   Humiliation, Afraid, Rape, and Kick questionnaire    Fear of Current or Ex-Partner: No    Emotionally Abused: No    Physically Abused: No    Sexually Abused: No     Family History:   Family History  Problem Relation Age of Onset   Cancer Mother    Diabetes Mother    Hypertension Mother    Other Father        unknown medical history    ROS:  Review of Systems  Constitutional:  Positive for chills, fever and malaise/fatigue. Negative for diaphoresis and weight loss.  HENT:  Negative for congestion.   Eyes:  Negative for discharge and redness.  Respiratory:  Positive for shortness of breath. Negative for cough, sputum production and wheezing.   Cardiovascular:  Negative for chest pain, palpitations, orthopnea, claudication, leg swelling and PND.  Gastrointestinal:  Negative for abdominal pain, heartburn, nausea and vomiting.  Genitourinary:  Negative for hematuria.  Musculoskeletal:  Negative for falls and myalgias.  Skin:  Negative for rash.  Neurological:  Positive for dizziness and weakness. Negative for tingling, tremors, sensory change, speech change, focal weakness and loss of consciousness.  Endo/Heme/Allergies:  Does not bruise/bleed easily.  Psychiatric/Behavioral:  Negative for substance abuse. The patient is not nervous/anxious.   All other systems reviewed and are negative.     Physical Exam/Data:    Vitals:   02/28/22 0346 02/28/22 0437 02/28/22 0447 02/28/22 0625  BP: (!) 96/53 (!) 91/51 (!) 93/50 (!) 115/59  Pulse: 78 78 75 87  Resp: 15 15 18 18   Temp:    98.1 F (36.7 C)  TempSrc:    Oral  SpO2: 95% 98% 95% 100%  Weight:        Intake/Output Summary (Last 24 hours) at 02/28/2022 0734 Last data filed at 02/28/2022 0453 Gross per 24 hour  Intake 1850 ml  Output --  Net 1850 ml   Filed Weights   02/28/22 0155  Weight: 83.9 kg   Body mass index is 33.84 kg/m.   Physical Exam: General: Well developed, well nourished, in no acute distress. Head: Normocephalic, atraumatic, sclera non-icteric, no xanthomas, nares without discharge.  Neck: Negative for carotid bruits. JVD not elevated. Lungs: Diminished and coarse breath sounds bilaterally. Breathing is unlabored. Heart: Tachycardic with S1 S2. No murmurs, rubs, or gallops appreciated. Abdomen: Soft, non-tender, non-distended with normoactive bowel sounds. No hepatomegaly. No rebound/guarding. No obvious abdominal masses. Msk:  Strength and tone appear normal for age. Extremities: No clubbing or cyanosis. No edema. Distal pedal pulses are 2+ and equal bilaterally. Neuro: Alert and oriented X 3. No facial asymmetry. No focal deficit. Moves all extremities spontaneously. Psych:  Responds to questions appropriately with a normal affect.   EKG:  The EKG was personally reviewed and demonstrates: sinus tachycardia, 126 bpm, low voltage QRS, cannot exclude prior anterior infarct, baseline artifact with nonspecific inferior ST-T changes Telemetry:  Telemetry was personally reviewed and demonstrates: SR with sinus tachycardia with rates in the 80s to 140s bpm  Weights: Filed Weights   02/28/22 0155  Weight: 83.9 kg    Relevant CV Studies:  2D echo pending  Laboratory Data:  Chemistry Recent Labs  Lab 02/27/22 1811 02/28/22 0132 02/28/22 0256  NA 131*  --  133*  K 2.6*  --  4.0  CL 94*  --  97*  CO2 24  --  23   GLUCOSE 338*  --  230*  BUN 16  --  19  CREATININE 0.89 1.15* 1.15*  CALCIUM 8.5*  --  8.2*  GFRNONAA >60 51* 51*  ANIONGAP 13  --  13    No results for input(s): "PROT", "ALBUMIN", "AST", "ALT", "ALKPHOS", "BILITOT" in the last 168 hours. Hematology Recent Labs  Lab  02/27/22 1811 02/28/22 0132  WBC 5.8 9.6  RBC 4.29 3.72*  HGB 12.2 10.5*  HCT 37.5 32.7*  MCV 87.4 87.9  MCH 28.4 28.2  MCHC 32.5 32.1  RDW 17.8* 18.0*  PLT 146* 139*   Cardiac EnzymesNo results for input(s): "TROPONINI" in the last 168 hours. No results for input(s): "TROPIPOC" in the last 168 hours.  BNPNo results for input(s): "BNP", "PROBNP" in the last 168 hours.  DDimer No results for input(s): "DDIMER" in the last 168 hours.  Radiology/Studies:  CT Angio Chest Pulmonary Embolism (PE) W or WO Contrast  Result Date: 02/28/2022 IMPRESSION: 1. No pulmonary embolism. 2. Moderate coronary artery calcification within the left anterior descending coronary artery. 3. Scattered bilateral ground-glass opacity likely relates to atelectasis due to expiratory phase imaging. No definite superimposed focal pulmonary nodule or infiltrate. Aortic Atherosclerosis (ICD10-I70.0). Electronically Signed   By: Helyn Numbers M.D.   On: 02/28/2022 02:13   DG Chest Portable 1 View  Result Date: 02/27/2022 IMPRESSION: 1. No acute cardiopulmonary process. 2. Faint focal area of increased density in the right suprahilar region. Follow-up with chest radiograph or CT on a nonemergent/outpatient basis recommended. Electronically Signed   By: Elgie Collard M.D.   On: 02/27/2022 21:53    Assessment and Plan:   Coronary artery calcification with mildly elevated high-sensitivity troponin: -Never with chest pain, dyspnea, or symptoms of cardiac decompensation -EKG with nonspecific ST-T changes -Mildly elevated high-sensitivity troponin is flat trending and not consistent with ACS, no indication for heparin drip without dynamic troponin  elevation -Agree with echo, if results are reassuring, would consider outpatient ischemic evaluation with coronary CTA once she has improved from her acute COVID illness -LDL of 25 in 12/2021 -ASA -Rosuvastatin  Acute hypoxic respiratory failure with severe sepsis secondary to COVID infection: -Likely the driving force of her presenting symptoms -Add supplemental oxygen -May need BiPAP, would have a low threshold to consult CCM, will defer to primary service  -Management per primary service  Orthostatic hypotension: -Likely exacerbated by her severe sepsis and COVID infection -IV hydration -Obtain orthostatics at the discretion of primary service -Doubt related to mildly elevated troponin -Echo pending  Hypokalemia: -Repleted  HTN: -BP soft in the setting of severe sepsis with COVID infection and UTI -Resume PTA antihypertensive therapy at the discretion of IM  Poorly controlled diabetes: -Last A1c 11.1 -Management per primary service   For questions or updates, please contact [facility] Please consult [email or web address] for contact info under Cardiology/STEMI.   Signed, Eula Listen, PA-C [facility] Pager: [phone] 02/28/2022, 7:34 AM

## 2022-02-28 NOTE — Assessment & Plan Note (Addendum)
Sepsis criteria includes fever, tachycardia, hypotension, hypoxia with COVID infection, possible pneumonia as well as urinary tract infection Chest x-ray showing right suprahilar density with recommendation for follow-up CT versus chest x-ray IV sepsis fluids Follow procalcitonin and lactic acid-->6.7 Antitussives, albuterol as needed Supplemental oxygen if needed Follow blood cultures

## 2022-02-28 NOTE — Assessment & Plan Note (Signed)
Urinalysis consistent with UTI Rocephin started

## 2022-02-28 NOTE — Assessment & Plan Note (Signed)
Blood sugar 338 Sliding scale insulin coverage

## 2022-02-28 NOTE — Progress Notes (Signed)
Interval events noted.  She complains of nausea.  No vomiting, abdominal pain, chest pain, dizziness or shortness of breath.  She is tachycardic, tachypneic and hypotensive.  Blood pressure dropped to 64/46 later in the morning.  Oxygen saturation dropped to 85% on room air. Start 500 normal saline bolus for hypotension.  She has been placed on 2 L/min oxygen via nasal cannula for acute hypoxic respiratory failure.  Continue empiric IV antibiotics for severe sepsis secondary to E. coli bacteremia and UTI.  Robitussin as needed for cough COVID infection.  Elevated troponins likely due to demand ischemia.  2D echo is pending.  She has been evaluated by the cardiologist.  Plan of care discussed with Sonia Side, son, at the bedside.

## 2022-02-28 NOTE — Consult Note (Addendum)
NAME:  Erika Perry, MRN:  [id number], DOB:  1951/01/24, LOS: 0 ADMISSION DATE:  02/27/2022, CONSULTATION DATE: 02/28/22 REFERRING MD: Dr. Mal Misty, CHIEF COMPLAINT: Hypotension    History of Present Illness:  This is a 72 yo female who presented to [facility] ER on 01/19 via EMS with sudden onset of dizziness mostly with standing (felt like she was going to pass out) and confusion.  She reported when she lays down symptoms improve.  Pt also endorsed dysuria a few days prior to presentation.  EMS reported when they arrived pt hypoxic with O2 sats 87% on RA and hyperglycemic CBG 417.  She received 500 ml NS bolus per EMS.   ED Course  Upon arrival to the ER pt alert and oriented.  EKG revealed nsr, hr 90, and no ST elevation or abnormality.  Pt was positive for orthostatics and received 1L IV fluid bolus.  Lab results significant for Na+ 131/K+ 2.6/glucose 338/calcium 8.5/troponin 29/platelets 146.  She received po and iv K+ repletion.  CXR negative, however COVID-19 positive.  Pt subsequently admitted to the progressive care unit per hospitalist team. See detailed hospital course below.  CTA Chest:  No pulmonary embolism. Moderate coronary artery calcification within the left anterior descending coronary artery. Scattered bilateral ground-glass opacity likely relates to atelectasis due to expiratory phase imaging. No definite superimposed focal pulmonary nodule or infiltrate. Aortic Atherosclerosis (ICD10-I70.0).  Pertinent  Medical History  HLD HTN Pre-diabetes Vitamin D Deficiency  Closed Fracture of the Right Distal Humerus   Significant Hospital Events: Including procedures, antibiotic start and stop dates in addition to other pertinent events   01/20: Pt admitted to the progressive care unit with severe sepsis, and acute respiratory failure secondary to COVID-19, postural dizziness with presyncope, UTI.  However, she remained in the ER pending bed availability  01/20: Pt developed worsening  septic shock despite aggressive iv fluid resuscitation requiring levophed gtt.  PCCM team consulted to assist with management   Interim History / Subjective:  Pt appears very weak and volume depleted.  Currently requiring levophed gtt @4mcq /min to maintain map >25  Objective   Blood pressure (!) 68/48, pulse (!) 110, temperature 99.2 F (37.3 C), temperature source Oral, resp. rate (!) 22, weight 83.9 kg, SpO2 93 %.        Intake/Output Summary (Last 24 hours) at 02/28/2022 1357 Last data filed at 02/28/2022 1339 Gross per 24 hour  Intake 2330.83 ml  Output --  Net 2330.83 ml   Filed Weights   02/28/22 0155  Weight: 83.9 kg    Examination: General: Acutely ill appearing female, NAD on 2L O2 via nasal canula  HENT: Supple, no JVD  Lungs: Clear throughout, even, non labored  Cardiovascular: Sinus tachycardia, s1s2, no r/g, 2+ radial/1+ distal pulses, no edema  Abdomen: +BS x4, soft, obese, non tender, non distended  Extremities: Normal bulk and tone, moves all extremities  Neuro: Alert and oriented, follows commands, PERRLA  GU: Purewick in place   Resolved Hospital Problem list     Assessment & Plan:  Acute hypoxic respiratory failure secondary to COVID-19 - Supplemental O2 for dyspnea and/or hypoxia  - Maintain O2 sats >92% - Continue scheduled and prn bronchodilator therapy   Septic shock  Elevated troponin likely demand ischemia in the setting of acute illness  Hx: HLD and HTN  - Continuous telemetry monitoring  - Cardiology consulted appreciate input: no plans for ischemic workup at this time and no indication for heparin gtt for now  -  Trend troponin's until peaked  - Aggressive iv fluid resuscitation, scheduled midodrine, and prn levophed gtt to maintain map >65 - Continue outpatient rosuvastatin  - Hold outpatient antihypertensives   Mild AKI secondary to ATN  Hyponatremia  Lactic acidosis  - Trend BMP and lactic acid  - Replace electrolytes as indicated   - Monitor UOP - Avoid nephrotoxic medications as able   Severe sepsis secondary to COVID-19 and ecoli bacteremia likely secondary to UTI  - Trend WBC and monitor fever curve  - Trend PCT  - Continue ceftriaxone and azithromycin pending sensitivities   Hyperglycemia  Hx: Prediabetes - CBG's ac/hs  - SSI   Best Practice (right click and "Reselect all SmartList Selections" daily)   Diet/type: Regular consistency (see orders) DVT prophylaxis: LMWH GI prophylaxis: N/A Lines: N/A Foley:  N/A Code Status:  full code Last date of multidisciplinary goals of care discussion [N/A]  Labs   CBC: Recent Labs  Lab 02/27/22 1811 02/28/22 0132  WBC 5.8 9.6  HGB 12.2 10.5*  HCT 37.5 32.7*  MCV 87.4 87.9  PLT 146* 139*    Basic Metabolic Panel: Recent Labs  Lab 02/27/22 1811 02/28/22 0132 02/28/22 0256  NA 131*  --  133*  K 2.6*  --  4.0  CL 94*  --  97*  CO2 24  --  23  GLUCOSE 338*  --  230*  BUN 16  --  19  CREATININE 0.89 1.15* 1.15*  CALCIUM 8.5*  --  8.2*  MG 1.7  --   --    GFR: Estimated Creatinine Clearance: 45.7 mL/min (A) (by C-G formula based on SCr of 1.15 mg/dL (H)). Recent Labs  Lab 02/27/22 1811 02/28/22 0031 02/28/22 0132 02/28/22 0347  PROCALCITON  --   --  34.62  --   WBC 5.8  --  9.6  --   LATICACIDVEN  --  6.7*  --  2.6*    Liver Function Tests: No results for input(s): "AST", "ALT", "ALKPHOS", "BILITOT", "PROT", "ALBUMIN" in the last 168 hours. No results for input(s): "LIPASE", "AMYLASE" in the last 168 hours. No results for input(s): "AMMONIA" in the last 168 hours.  ABG No results found for: "PHART", "PCO2ART", "PO2ART", "HCO3", "TCO2", "ACIDBASEDEF", "O2SAT"   Coagulation Profile: No results for input(s): "INR", "PROTIME" in the last 168 hours.  Cardiac Enzymes: No results for input(s): "CKTOTAL", "CKMB", "CKMBINDEX", "TROPONINI" in the last 168 hours.  HbA1C: Hgb A1c MFr Bld  Date/Time Value Ref Range Status  12/23/2021 09:44  AM 11.1 (H) 4.6 - 6.5 % Final    Comment:    Glycemic Control Guidelines for People with Diabetes:Non Diabetic:  <6%Goal of Therapy: <7%Additional Action Suggested:  >8%   06/16/2021 11:52 AM 5.9 4.6 - 6.5 % Final    Comment:    Glycemic Control Guidelines for People with Diabetes:Non Diabetic:  <6%Goal of Therapy: <7%Additional Action Suggested:  >8%     CBG: Recent Labs  Lab 02/28/22 0008 02/28/22 0831 02/28/22 1230  GLUCAP 231* 166* 221*    Review of Systems: Positives in BOLD   Gen: Denies fever, chills, weight change, fatigue, night sweats HEENT: Denies blurred vision, double vision, hearing loss, tinnitus, sinus congestion, rhinorrhea, sore throat, neck stiffness, dysphagia PULM: shortness of breath, cough, sputum production, hemoptysis, wheezing CV: Denies chest pain, edema, orthopnea, paroxysmal nocturnal dyspnea, palpitations GI: Denies abdominal pain, nausea, vomiting, diarrhea, hematochezia, melena, constipation, change in bowel habits GU: Denies dysuria, hematuria, polyuria, oliguria, urethral discharge Endocrine: Denies hot  or cold intolerance, polyuria, polyphagia or appetite change Derm: Denies rash, dry skin, scaling or peeling skin change Heme: Denies easy bruising, bleeding, bleeding gums Neuro: dizziness, headache, numbness, weakness, slurred speech, loss of memory or consciousness  Past Medical History:  She,  has a past medical history of BMI 35.0-35.9,adult (07/17/2020), Closed fracture of right distal humerus (07/12/2021), HLD (hyperlipidemia), Hypertension, Pre-diabetes, and Vitamin D deficiency (07/13/2021).   Surgical History:   Past Surgical History:  Procedure Laterality Date   CESAREAN SECTION  1981, 1990   CLOSED REDUCTION ELBOW FRACTURE Right 09/29/2021   Procedure: CLOSED MANIPULATION ELBOW;  Surgeon: Shona Needles, MD;  Location: [place];  Service: Orthopedics;  Laterality: Right;   ORIF HUMERUS FRACTURE Right 07/11/2021   Procedure: OPEN REDUCTION  INTERNAL FIXATION (ORIF) DISTAL HUMERUS FRACTURE;  Surgeon: Shona Needles, MD;  Location: [place];  Service: Orthopedics;  Laterality: Right;   TONSILLECTOMY       Social History:   reports that she has never smoked. She has never used smokeless tobacco. She reports that she does not drink alcohol and does not use drugs.   Family History:  Her family history includes Cancer in her mother; Diabetes in her mother; Hypertension in her mother; Other in her father.   Allergies Allergies  Allergen Reactions   Latex     Pt states that she is allergic to latex in pre-op.  Regardless of allergy type latex will not be used.    Bactrim [Sulfamethoxazole-Trimethoprim]     Nausea, no appitite   Penicillins     Tolerated Cephalosporin Date: 07/11/2021.       Home Medications  Prior to Admission medications   Medication Sig Start Date End Date Taking? Authorizing Provider  hydrochlorothiazide (MICROZIDE) 12.5 MG capsule Take 1 capsule (12.5 mg total) by mouth daily. 09/17/21  Yes Leone Haven, MD  ketoconazole (NIZORAL) 2 % cream Apply 1 Application topically daily. 01/19/22  Yes Leone Haven, MD  losartan (COZAAR) 50 MG tablet Take 1 tablet (50 mg total) by mouth daily. 09/17/21  Yes Leone Haven, MD  metFORMIN (GLUCOPHAGE) 500 MG tablet Take 1 tablet (500 mg total) by mouth 2 (two) times daily with a meal. 12/29/21  Yes Leone Haven, MD  rosuvastatin (CRESTOR) 20 MG tablet Take 1 tablet (20 mg total) by mouth daily. 12/30/21  Yes Leone Haven, MD  atorvastatin (LIPITOR) 10 MG tablet Take 1 tablet (10 mg total) by mouth daily. 07/18/18 10/23/19  Jodelle Green, FNP     Critical care time: 50 minutes     Donell Beers, [place] Pager [phone] (please enter 7 digits) PCCM Consult Pager [phone] (please enter 7 digits)

## 2022-02-28 NOTE — ED Notes (Signed)
IV team at bedside 

## 2022-02-28 NOTE — Assessment & Plan Note (Addendum)
Secondary to COVID infection, possible secondary bacterial infection Patient had symptoms for a week so no antivirals indicated Rocephin and azithromycin and follow procalcitonin Will get CTA chest in view of presyncopal episode, risk of VTE with COVID diagnosis--->negative for PE Supplemental O2 to keep sats 94 and above

## 2022-02-28 NOTE — Assessment & Plan Note (Signed)
Repleted with IV and oral potassium in the ED Continue to monitor and replete as necessary

## 2022-02-28 NOTE — Progress Notes (Signed)
Repeat blood pressure was 68/48 after 500 mL bolus of normal saline.  Give another bolus of 500 mL normal saline.  Ordered Levophed infusion for septic shock.  Consulted intensivist, Dr. Mortimer Fries, for septic shock.

## 2022-02-28 NOTE — Assessment & Plan Note (Signed)
Hold home antihypertensives due to hypotension on arrival

## 2022-02-28 NOTE — ED Notes (Signed)
Pt actively vomitting, rn secure chat to MD, Mal Misty, phenergran ordered

## 2022-02-28 NOTE — Progress Notes (Signed)
CROSS COVER NOTE  NAME: Erika Perry MRN: [id number] DOB : [birth year]  HPI/Events of Note   Low/soft pressures reported.  Assessment and  Interventions   Assessment: No change in mentation. Heart rate in 70'2 Creatinine elevated now to 1.15 Plan: 500 cc NS bolus Continue to monitor from 0.89      Kathlene Cote NP [facility]

## 2022-02-28 NOTE — Assessment & Plan Note (Addendum)
Patient was orthostatic in the ED, likely secondary to acute COVID infection, versus hypotension from sepsis versus NSTEMI BP was fluid responsive Will get echocardiogram in the a.m., continuous cardiac monitoring overnight Continue IV hydration Neurologic checks Fall and aspiration precautions Plan for each potential etiology outlined below

## 2022-02-28 NOTE — Progress Notes (Signed)
Anticoagulation monitoring(Lovenox):  73 yo female ordered Lovenox 40 mg Q24h    Filed Weights   02/28/22 0155  Weight: 83.9 kg (185 lb)   BMI 33   Lab Results  Component Value Date   CREATININE 0.89 02/27/2022   CREATININE 0.78 09/29/2021   CREATININE 0.61 07/13/2021   Estimated Creatinine Clearance: 59.1 mL/min (by C-G formula based on SCr of 0.89 mg/dL). Hemoglobin & Hematocrit     Component Value Date/Time   HGB 10.5 (L) 02/28/2022 0132   HGB 14.3 04/16/2020 0951   HCT 32.7 (L) 02/28/2022 0132   HCT 43.0 04/16/2020 0951     Per Protocol for Patient with estCrcl > 30 ml/min and BMI > 30, will transition to Lovenox 42.5 mg Q24h.

## 2022-02-28 NOTE — Progress Notes (Signed)
An USGPIV (ultrasound guided PIV) has been placed early in the morning by IV team. It can use for short-term vasopressor infusion. Marked where is end of tip of catheter. A correctly placed ivWatch must be used when administering Vasopressors. Should this treatment be needed be yond 72 hours, central line access should be obtained.  It will be the responsibility of the bedside nurse to follow best practice to prevent extravasations. HS [employer]

## 2022-02-28 NOTE — ED Notes (Signed)
This RN sent down first set of cultures.

## 2022-02-28 NOTE — ED Notes (Signed)
Secure chat to ouma, md, pt states she is still feeling nauseas, provider aware she is having intermittent episodes of nausea

## 2022-02-28 NOTE — H&P (Addendum)
History and Physical    Patient: Erika Perry UJW:[id number] DOB: Dec 06, 1950 DOA: 02/27/2022 DOS: the patient was seen and examined on 02/28/2022 PCP: Leone Haven, MD  Patient coming from: Home  Chief Complaint:  Chief Complaint  Patient presents with   Dizziness    HPI: Erika Perry is a 72 y.o. female with medical history significant for DM, HTN who presents to the ED for evaluation of an episode of dizziness and confusion at home the episode happened on standing.  Patient states she had been coughing for a week.  And earlier in the day she had experienced some chills but had no fever.  She denies  shortness of breath and denies chest pain, vomiting, diarrhea or abdominal pain or dysuria.    O2 sat on arrival of EMS was 87% ED course and data review: Tmax 100.1 with pulse 128 and BP as low as 90/56 with O2 sat in the low 90s.  COVID-positive.: Troponin 29-1 10.  CBC WNL.  BMP significant for potassium of 2.6 and glucose 338.  Lactic acid pending.  EKG, personally viewed and interpreted showing sinus tachycardia at 126 with no ischemic ST-T wave changes.  Chest x-ray with no acute cardiopulmonary process though did show an area of increased density in the right suprahilar region with recommendation for follow-up x-ray or CT.  Patient was treated with an IV fluid bolus and given IV and oral potassium repletion.  She was also given chewable aspirin for possible NSTEMI.  Hospitalist consulted for admission   Review of Systems: As mentioned in the history of present illness. All other systems reviewed and are negative.  Past Medical History:  Diagnosis Date   BMI 35.0-35.9,adult 07/17/2020   Closed fracture of right distal humerus 07/12/2021   HLD (hyperlipidemia)    Hypertension    Pre-diabetes    Vitamin D deficiency 07/13/2021   Past Surgical History:  Procedure Laterality Date   CESAREAN SECTION  1981, 1990   CLOSED REDUCTION ELBOW FRACTURE Right 09/29/2021   Procedure: CLOSED  MANIPULATION ELBOW;  Surgeon: Shona Needles, MD;  Location: [facility];  Service: Orthopedics;  Laterality: Right;   ORIF HUMERUS FRACTURE Right 07/11/2021   Procedure: OPEN REDUCTION INTERNAL FIXATION (ORIF) DISTAL HUMERUS FRACTURE;  Surgeon: Shona Needles, MD;  Location: [facility];  Service: Orthopedics;  Laterality: Right;   TONSILLECTOMY     Social History:  reports that she has never smoked. She has never used smokeless tobacco. She reports that she does not drink alcohol and does not use drugs.  Allergies  Allergen Reactions   Latex     Pt states that she is allergic to latex in pre-op.  Regardless of allergy type latex will not be used.    Bactrim [Sulfamethoxazole-Trimethoprim]     Nausea, no appitite   Penicillins     Tolerated Cephalosporin Date: 07/11/2021.      Family History  Problem Relation Age of Onset   Cancer Mother    Diabetes Mother    Hypertension Mother    Other Father        unknown medical history    Prior to Admission medications   Medication Sig Start Date End Date Taking? Authorizing Provider  hydrochlorothiazide (MICROZIDE) 12.5 MG capsule Take 1 capsule (12.5 mg total) by mouth daily. 09/17/21   Leone Haven, MD  ketoconazole (NIZORAL) 2 % cream Apply 1 Application topically daily. 01/19/22   Leone Haven, MD  losartan (COZAAR) 50 MG tablet Take  1 tablet (50 mg total) by mouth daily. 09/17/21   Glori Luis, MD  metFORMIN (GLUCOPHAGE) 500 MG tablet Take 1 tablet (500 mg total) by mouth 2 (two) times daily with a meal. 12/29/21   Glori Luis, MD  rosuvastatin (CRESTOR) 20 MG tablet Take 1 tablet (20 mg total) by mouth daily. 12/30/21   Glori Luis, MD  atorvastatin (LIPITOR) 10 MG tablet Take 1 tablet (10 mg total) by mouth daily. 07/18/18 10/23/19  Tracey Harries, FNP    Physical Exam: Vitals:   02/27/22 2130 02/27/22 2200 02/27/22 2230 02/27/22 2330  BP: (!) 83/54 (!) 90/56 (!) 104/57 104/67  Pulse: 98 (!) 103 (!) 101 98   Resp: 20  18 16   Temp:      TempSrc:      SpO2: 91% 91% 94% 98%   Physical Exam Vitals and nursing note reviewed.  Constitutional:      General: She is not in acute distress. HENT:     Head: Normocephalic and atraumatic.  Cardiovascular:     Rate and Rhythm: Regular rhythm. Tachycardia present.     Heart sounds: Normal heart sounds.  Pulmonary:     Effort: Pulmonary effort is normal.     Breath sounds: Normal breath sounds.  Abdominal:     Palpations: Abdomen is soft.     Tenderness: There is no abdominal tenderness.  Neurological:     Mental Status: Mental status is at baseline.     Labs on Admission: I have personally reviewed following labs and imaging studies  CBC: Recent Labs  Lab 02/27/22 1811  WBC 5.8  HGB 12.2  HCT 37.5  MCV 87.4  PLT 146*   Basic Metabolic Panel: Recent Labs  Lab 02/27/22 1811  NA 131*  K 2.6*  CL 94*  CO2 24  GLUCOSE 338*  BUN 16  CREATININE 0.89  CALCIUM 8.5*  MG 1.7   GFR: CrCl cannot be calculated (Unknown ideal weight.). Liver Function Tests: No results for input(s): "AST", "ALT", "ALKPHOS", "BILITOT", "PROT", "ALBUMIN" in the last 168 hours. No results for input(s): "LIPASE", "AMYLASE" in the last 168 hours. No results for input(s): "AMMONIA" in the last 168 hours. Coagulation Profile: No results for input(s): "INR", "PROTIME" in the last 168 hours. Cardiac Enzymes: No results for input(s): "CKTOTAL", "CKMB", "CKMBINDEX", "TROPONINI" in the last 168 hours. BNP (last 3 results) No results for input(s): "PROBNP" in the last 8760 hours. HbA1C: No results for input(s): "HGBA1C" in the last 72 hours. CBG: Recent Labs  Lab 02/28/22 0008  GLUCAP 231*   Lipid Profile: No results for input(s): "CHOL", "HDL", "LDLCALC", "TRIG", "CHOLHDL", "LDLDIRECT" in the last 72 hours. Thyroid Function Tests: No results for input(s): "TSH", "T4TOTAL", "FREET4", "T3FREE", "THYROIDAB" in the last 72 hours. Anemia Panel: No results  for input(s): "VITAMINB12", "FOLATE", "FERRITIN", "TIBC", "IRON", "RETICCTPCT" in the last 72 hours. Urine analysis:    Component Value Date/Time   BILIRUBINUR small 04/16/2020 0932   PROTEINUR Negative 04/16/2020 0932   UROBILINOGEN 0.2 04/16/2020 0932   NITRITE negative 04/16/2020 0932   LEUKOCYTESUR Trace (A) 04/16/2020 0932    Radiological Exams on Admission: DG Chest Portable 1 View  Result Date: 02/27/2022 CLINICAL DATA:  Shortness of breath. EXAM: PORTABLE CHEST 1 VIEW COMPARISON:  None Available. FINDINGS: No focal consolidation, pleural effusion, or pneumothorax. Faint focal area of increased density in the right suprahilar region measuring approximately 1.7 x 1.9 cm, likely interstitial or vascular crowding. CT may provide better evaluation  on a nonemergent/outpatient basis. The cardiac silhouette is within normal limits. No acute osseous pathology. IMPRESSION: 1. No acute cardiopulmonary process. 2. Faint focal area of increased density in the right suprahilar region. Follow-up with chest radiograph or CT on a nonemergent/outpatient basis recommended. Electronically Signed   By: Anner Crete M.D.   On: 02/27/2022 21:53     Data Reviewed: Relevant notes from primary care and specialist visits, past discharge summaries as available in EHR, including Care Everywhere. Prior diagnostic testing as pertinent to current admission diagnoses Updated medications and problem lists for reconciliation ED course, including vitals, labs, imaging, treatment and response to treatment Triage notes, nursing and pharmacy notes and ED provider's notes Notable results as noted in HPI   Assessment and Plan: * Postural dizziness with presyncope Patient was orthostatic in the ED, likely secondary to acute COVID infection, versus hypotension from sepsis versus NSTEMI BP was fluid responsive Will get echocardiogram in the a.m., continuous cardiac monitoring overnight Continue IV  hydration Neurologic checks Fall and aspiration precautions Plan for each potential etiology outlined below  Severe sepsis (HCC) Sepsis criteria includes fever, tachycardia, hypotension, hypoxia with COVID infection, possible pneumonia as well as urinary tract infection Chest x-ray showing right suprahilar density with recommendation for follow-up CT versus chest x-ray IV sepsis fluids Follow procalcitonin and lactic acid-->6.7 Antitussives, albuterol as needed Supplemental oxygen if needed Follow blood cultures  Acute respiratory failure due to COVID-19 ([place]) Secondary to COVID infection, possible secondary bacterial infection Patient had symptoms for a week so no antivirals indicated Rocephin and azithromycin and follow procalcitonin Will get CTA chest in view of presyncopal episode, risk of VTE with COVID diagnosis--->negative for PE Supplemental O2 to keep sats 94 and above  Urinary tract infection without hematuria Urinalysis consistent with UTI Rocephin started  NSTEMI (non-ST elevated myocardial infarction) ([place]) Troponin 29-110 but no ischemic changes on EKG and patient denies chest pain Will continue to trend troponin Suspecting demand ischemia from presyncopal episode Received chewable aspirin in the ED If continued uptrend will start heparin infusion Will get echocardiogram to evaluate for wall motion abnormality Continue rosuvastatin Cardiology consult  Hypokalemia Repleted with IV and oral potassium in the ED Continue to monitor and replete as necessary  Uncontrolled type 2 diabetes mellitus with hyperglycemia, without long-term current use of insulin (HCC) Blood sugar 338 Sliding scale insulin coverage  Hypertension Hold home antihypertensives due to hypotension on arrival        DVT prophylaxis: Lovenox  Consults: [facility] cardiology, Dr Daniel Nones  Advance Care Planning:   Code Status: Prior   Family Communication: none  Disposition Plan: Back to  previous home environment  Severity of Illness: The appropriate patient status for this patient is INPATIENT. Inpatient status is judged to be reasonable and necessary in order to provide the required intensity of service to ensure the patient's safety. The patient's presenting symptoms, physical exam findings, and initial radiographic and laboratory data in the context of their chronic comorbidities is felt to place them at high risk for further clinical deterioration. Furthermore, it is not anticipated that the patient will be medically stable for discharge from the hospital within 2 midnights of admission.   * I certify that at the point of admission it is my clinical judgment that the patient will require inpatient hospital care spanning beyond 2 midnights from the point of admission due to high intensity of service, high risk for further deterioration and high frequency of surveillance required.*  CRITICAL CARE Performed by: Onalee Hua  V Jamilia Jacques   Total critical care time: 60 minutes  Critical care time was exclusive of separately billable procedures and treating other patients.  Critical care was necessary to treat or prevent imminent or life-threatening deterioration.  Critical care was time spent personally by me on the following activities: development of treatment plan with patient and/or surrogate as well as nursing, discussions with consultants, evaluation of patient's response to treatment, examination of patient, obtaining history from patient or surrogate, ordering and performing treatments and interventions, ordering and review of laboratory studies, ordering and review of radiographic studies, pulse oximetry and re-evaluation of patient's condition.   Author: Andris Baumann, MD 02/28/2022 12:21 AM  For on call review [email or web address].

## 2022-02-28 NOTE — ED Notes (Signed)
EPIC alerted RN about CODE sepsis activated 30 mins ago and would any further action be necessary as antibiotics have no been started. MD was messaged and she stated "No antibiotics at this time".

## 2022-02-28 NOTE — ED Notes (Signed)
Pt ambulated to toilet and back to bed with assistance.  

## 2022-02-28 NOTE — Assessment & Plan Note (Addendum)
Troponin 29-110 but no ischemic changes on EKG and patient denies chest pain Will continue to trend troponin Suspecting demand ischemia from presyncopal episode Received chewable aspirin in the ED If continued uptrend will start heparin infusion Will get echocardiogram to evaluate for wall motion abnormality Continue rosuvastatin Cardiology consult

## 2022-02-28 NOTE — ED Notes (Signed)
Provider, Mal Misty, MD, notified of low bp via secure chat

## 2022-03-01 DIAGNOSIS — U071 COVID-19: Secondary | ICD-10-CM | POA: Diagnosis not present

## 2022-03-01 DIAGNOSIS — R7881 Bacteremia: Secondary | ICD-10-CM

## 2022-03-01 DIAGNOSIS — R6521 Severe sepsis with septic shock: Secondary | ICD-10-CM

## 2022-03-01 DIAGNOSIS — A419 Sepsis, unspecified organism: Secondary | ICD-10-CM | POA: Diagnosis not present

## 2022-03-01 DIAGNOSIS — I429 Cardiomyopathy, unspecified: Secondary | ICD-10-CM

## 2022-03-01 DIAGNOSIS — I420 Dilated cardiomyopathy: Secondary | ICD-10-CM

## 2022-03-01 DIAGNOSIS — R6520 Severe sepsis without septic shock: Secondary | ICD-10-CM | POA: Diagnosis not present

## 2022-03-01 DIAGNOSIS — R7989 Other specified abnormal findings of blood chemistry: Secondary | ICD-10-CM | POA: Diagnosis not present

## 2022-03-01 DIAGNOSIS — B9620 Unspecified Escherichia coli [E. coli] as the cause of diseases classified elsewhere: Secondary | ICD-10-CM

## 2022-03-01 DIAGNOSIS — N3000 Acute cystitis without hematuria: Secondary | ICD-10-CM | POA: Diagnosis not present

## 2022-03-01 DIAGNOSIS — J9601 Acute respiratory failure with hypoxia: Secondary | ICD-10-CM | POA: Diagnosis not present

## 2022-03-01 LAB — CBG MONITORING, ED
Glucose-Capillary: 217 mg/dL — ABNORMAL HIGH (ref 70–99)
Glucose-Capillary: 230 mg/dL — ABNORMAL HIGH (ref 70–99)
Glucose-Capillary: 243 mg/dL — ABNORMAL HIGH (ref 70–99)
Glucose-Capillary: 166 mg/dL — ABNORMAL HIGH (ref 70–99)
Glucose-Capillary: 151 mg/dL — ABNORMAL HIGH (ref 70–99)
Glucose-Capillary: 180 mg/dL — ABNORMAL HIGH (ref 70–99)

## 2022-03-01 LAB — COMPREHENSIVE METABOLIC PANEL
ALT: 15 U/L (ref 0–44)
AST: 23 U/L (ref 15–41)
Albumin: 2.5 g/dL — ABNORMAL LOW (ref 3.5–5.0)
Alkaline Phosphatase: 57 U/L (ref 38–126)
Anion gap: 7 (ref 5–15)
BUN: 21 mg/dL (ref 8–23)
CO2: 28 mmol/L (ref 22–32)
Calcium: 7.6 mg/dL — ABNORMAL LOW (ref 8.9–10.3)
Chloride: 98 mmol/L (ref 98–111)
Creatinine, Ser: 0.8 mg/dL (ref 0.44–1.00)
GFR, Estimated: >60 mL/min (ref >60)
Glucose, Bld: 247 mg/dL — ABNORMAL HIGH (ref 70–99)
Potassium: 3.5 mmol/L (ref 3.5–5.1)
Sodium: 133 mmol/L — ABNORMAL LOW (ref 135–145)
Total Bilirubin: 1.7 mg/dL — ABNORMAL HIGH (ref 0.3–1.2)
Total Protein: 5.9 g/dL — ABNORMAL LOW (ref 6.5–8.1)

## 2022-03-01 LAB — LACTIC ACID, PLASMA: Lactic Acid, Venous: 1 mmol/L (ref 0.5–1.9)

## 2022-03-01 LAB — APTT: aPTT: 27 seconds (ref 24–36)

## 2022-03-01 LAB — PROTIME-INR
INR: 1.2 (ref 0.8–1.2)
Prothrombin Time: 15.1 seconds (ref 11.4–15.2)

## 2022-03-01 LAB — CBC
HCT: 31.3 % — ABNORMAL LOW (ref 36.0–46.0)
Hemoglobin: 10 g/dL — ABNORMAL LOW (ref 12.0–15.0)
MCH: 28.9 pg (ref 26.0–34.0)
MCHC: 31.9 g/dL (ref 30.0–36.0)
MCV: 90.5 fL (ref 80.0–100.0)
Platelets: 125 10*3/uL — ABNORMAL LOW (ref 150–400)
RBC: 3.46 MIL/uL — ABNORMAL LOW (ref 3.87–5.11)
RDW: 18.2 % — ABNORMAL HIGH (ref 11.5–15.5)
WBC: 6.8 10*3/uL (ref 4.0–10.5)
nRBC: 0 % (ref 0.0–0.2)

## 2022-03-01 LAB — HEPARIN LEVEL (UNFRACTIONATED): Heparin Unfractionated: 0.16 IU/mL — ABNORMAL LOW (ref 0.30–0.70)

## 2022-03-01 LAB — HEMOGLOBIN A1C
Hgb A1c MFr Bld: 9 % — ABNORMAL HIGH (ref 4.8–5.6)
Mean Plasma Glucose: 211.6 mg/dL

## 2022-03-01 LAB — TROPONIN I (HIGH SENSITIVITY): Troponin I (High Sensitivity): 333 ng/L (ref <18)

## 2022-03-01 MED ORDER — HEPARIN BOLUS VIA INFUSION
2050.0000 [IU] | Freq: Once | INTRAVENOUS | Status: AC
  Administered 2022-03-01: 2050 [IU] via INTRAVENOUS
  Filled 2022-03-01: qty 2050

## 2022-03-01 MED ORDER — HEPARIN BOLUS VIA INFUSION
4000.0000 [IU] | Freq: Once | INTRAVENOUS | Status: AC
  Administered 2022-03-01: 4000 [IU] via INTRAVENOUS
  Filled 2022-03-01: qty 4000

## 2022-03-01 MED ORDER — LACTATED RINGERS IV BOLUS: 500.0000 mL | Freq: Once | INTRAVENOUS | Status: DC

## 2022-03-01 MED ORDER — HEPARIN (PORCINE) 25000 UT/250ML-% IV SOLN
2500.0000 [IU]/h | INTRAVENOUS | Status: DC
  Administered 2022-03-01: 850 [IU]/h via INTRAVENOUS
  Administered 2022-03-03: 2200 [IU]/h via INTRAVENOUS
  Administered 2022-03-03: 1850 [IU]/h via INTRAVENOUS
  Administered 2022-03-04: 2500 [IU]/h via INTRAVENOUS
  Filled 2022-03-01 (×3): qty 250
  Filled 2022-03-01: qty 1250
  Filled 2022-03-01: qty 250

## 2022-03-01 MED ORDER — LACTATED RINGERS IV BOLUS
1000.0000 mL | Freq: Once | INTRAVENOUS | Status: AC
  Administered 2022-03-01: 1000 mL via INTRAVENOUS

## 2022-03-01 NOTE — ED Notes (Signed)
Patient actively vomiting.  New gown provided

## 2022-03-01 NOTE — ED Notes (Signed)
Patient resting quietly in bed.  Respirations even and unlabored.  Vital signs stable.  Will continue to monitor

## 2022-03-01 NOTE — ED Notes (Signed)
Provider, foust, notified about current bp being outside of parameters.

## 2022-03-01 NOTE — Consult Note (Signed)
ANTICOAGULATION CONSULT NOTE - Initial Consult  Pharmacy Consult for heparin Indication: chest pain/ACS  Allergies  Allergen Reactions   Latex     Pt states that she is allergic to latex in pre-op.  Regardless of allergy type latex will not be used.    Bactrim [Sulfamethoxazole-Trimethoprim]     Nausea, no appitite   Penicillins     Tolerated Cephalosporin Date: 07/11/2021.      Patient Measurements: Height: 5\' 2"  (157.5 cm) Weight: 83.9 kg (184 lb 15.5 oz) IBW/kg (Calculated) : 50.1 Heparin Dosing Weight: 69  Vital Signs: Temp: 97.2 F (36.2 C) (01/21 1131) Temp Source: Axillary (01/21 1131) BP: 97/58 (01/21 1131) Pulse Rate: 86 (01/21 1131)  Labs: Recent Labs    02/27/22 1811 02/27/22 2232 02/28/22 0132 02/28/22 0256 02/28/22 0733 02/28/22 0734 02/28/22 1454 03/01/22 0734  HGB 12.2  --  10.5*  --  10.0*  --   --   --   HCT 37.5  --  32.7*  --  31.3*  --   --   --   PLT 146*  --  139*  --  125*  --   --   --   APTT  --   --   --   --   --  27  --   --   LABPROT  --   --   --   --   --  15.1  --   --   INR  --   --   --   --   --  1.2  --   --   CREATININE 0.89  --  1.15* 1.15*  --   --   --  0.80  TROPONINIHS 29*   < >  --  135*  --   --  130* 333*   < > = values in this interval not displayed.    Estimated Creatinine Clearance: 65.7 mL/min (by C-G formula based on SCr of 0.8 mg/dL).   Medical History: Past Medical History:  Diagnosis Date   BMI 35.0-35.9,adult 07/17/2020   Closed fracture of right distal humerus 07/12/2021   HLD (hyperlipidemia)    Hypertension    Pre-diabetes    Vitamin D deficiency 07/13/2021    Medications:  Enoxaparin >> Heparin  Assessment: 72 yo F with PMH DM, HTN presents with dizziness and AMS and a 1-week history of cough. On arrival, O2Sat was 87%. ECG showed sinus tachycardia and cTn has been trending up, from 29 >> 130 >> 333. Pharmacy has been consulted to initiate heparin for 48 hrs in the setting of chest pain/ACS.  Patient has been taking prophylactic enoxaparin in the ED. No chronic anticoagulation PTA noted from chart review. Hgb and PLT has trended down mildly since admission.  Heparin is intended to run for 48 hrs.  Goal of Therapy:  Heparin level 0.3-0.7 units/ml Monitor platelets by anticoagulation protocol: Yes  Baseline Labs: aPTT - 27; INR - 1.2 Hgb - 10.0; Plts - 125  Date Time aPTT/HL Rate/Comment    Plan:  Give 4000 units bolus x1; then start heparin infusion at 850 units/hr Heparin for 48 hours to begin 1/21 @ ~1400 Check anti-Xa level in 6 hours and daily once consecutively therapeutic. Continue to monitor H&H and platelets daily while on heparin gtt.  Will M. Ouida Sills, PharmD PGY-1 Pharmacy Resident 03/01/2022 1:24 PM

## 2022-03-01 NOTE — Progress Notes (Incomplete)
CROSS COVER NOTE  NAME: Erika Perry MRN: [id number] DOB : Oct 03, 1950 ATTENDING PHYSICIAN: Jennye Boroughs, MD    Date of Service   03/01/2022   HPI/Events of Note   BP 103/49  Interventions   Assessment/Plan: 554mL bolus *** Levophed X    *** professional thanks      To reach the provider On-Call:   7AM- 7PM see care teams to locate the attending and reach out to them via [email or web address] Password: [email or web address] 7PM-7AM contact night-coverage If you still have difficulty reaching the appropriate provider, please page the [facility] (Director on Call) for [facility] on amion for assistance  This document was prepared using Systems analyst and may include unintentional dictation errors.  Neomia Glass DNP, MBA, FNP-BC Nurse Practitioner [facility] Pager [phone]

## 2022-03-01 NOTE — Progress Notes (Signed)
Progress Note    Erika Perry  HKV:[id number] DOB: [birth year]  DOA: 02/27/2022 PCP: Glori Luis, MD      Brief Narrative:    Medical records reviewed and are as summarized below:  Erika Perry is a 72 y.o. female with medical history significant for hypertension, type II DM, obesity, vitamin D deficiency, who presented to the hospital because of confusion and dizziness from standing.  She also reported having chills shortly before admission.  She had been coughing for over a week.  Oxygen saturation was 87% on room air when EMS arrived.   She was hypotensive on admission.  Lactic acid was 6.7, procalcitonin was 34.62, sodium was 131, potassium 2.6, urinalysis was suggestive of UTI and COVID test was positive. She was admitted to the hospital for severe sepsis secondary to acute UTI.  She was treated with IV fluids and empiric IV antibiotics.  Hypotension worsened despite treatment with IV fluids.  She was started on IV Levophed infusion for septic shock.  Intensivist was consulted to assist with management. She required 2 L/min oxygen for acute hypoxic respiratory failure from severe sepsis.  Cardiologist was consulted for elevated troponins.    Assessment/Plan:   Principal Problem:   Septic shock (HCC) Active Problems:   COVID-19   E coli bacteremia   Urinary tract infection without hematuria   Postural dizziness with presyncope   NSTEMI (non-ST elevated myocardial infarction) (HCC)   Hypokalemia   Uncontrolled type 2 diabetes mellitus with hyperglycemia, without long-term current use of insulin (HCC)   Hypertension   Elevated troponin   Demand ischemia    Body mass index is 33.84 kg/m.  (Obesity)  Septic shock secondary to E. coli bacteremia and UTI: BP still low but overall it is better.  She is off of Levophed drip and IV fluids.  Continue midodrine.  Monitor BP closely.  Blood culture showed E. coli.  Continue IV ceftriaxone.  Discontinue oral  azithromycin. Lactic acid was 6.7 on admission.  Lactic acidosis has resolved.  Type II NSTEMI, demand ischemia: Elevated troponins is likely due to demand ischemia.  2D echo showed EF estimated at 35 to 40%, grade 1 diastolic dysfunction mild TR, mild MR, mild to moderate TR. Systolic dysfunction of unknown chronicity.  No plan for ischemic workup at this time.   Acute hypoxic respiratory failure: She is requiring 2.5 L/min oxygen via [place].  Wean off oxygen as able.   Postural dizziness likely from hypotension and severe sepsis   COVID-19 infection: Cough started over a week prior to admission so patient was not treated for COVID.   Type II DM with hyperglycemia: Continue NovoLog as needed for hyperglycemia.  Check hemoglobin A1c.   Hyperglycemia induced hyponatremia: Monitor BMP    Diet Order             Diet heart healthy/carb modified Room service appropriate? Yes; Fluid consistency: Thin  Diet effective now                            Consultants: Intensivist Cardiologist  Procedures: None    Medications:    enoxaparin (LOVENOX) injection  0.5 mg/kg Subcutaneous Q24H   insulin aspart  0-15 Units Subcutaneous TID WC   insulin aspart  0-5 Units Subcutaneous QHS   midodrine  5 mg Oral TID WC   potassium chloride  40 mEq Oral Daily   rosuvastatin  20 mg Oral Daily  sodium chloride flush  3 mL Intravenous Q12H   Continuous Infusions:  sodium chloride Stopped (02/28/22 1430)   azithromycin Stopped (03/01/22 0551)   cefTRIAXone (ROCEPHIN)  IV     norepinephrine (LEVOPHED) Adult infusion Stopped (03/01/22 0553)   promethazine (PHENERGAN) injection (IM or IVPB) Stopped (02/28/22 1747)     Anti-infectives (From admission, onward)    Start     Dose/Rate Route Frequency Ordered Stop   03/01/22 1000  cefTRIAXone (ROCEPHIN) 2 g in sodium chloride 0.9 % 100 mL IVPB        2 g 200 mL/hr over 30 Minutes Intravenous Every 24 hours 02/28/22 1302      02/28/22 1315  cefTRIAXone (ROCEPHIN) 1 g in sodium chloride 0.9 % 100 mL IVPB        1 g 200 mL/hr over 30 Minutes Intravenous  Once 02/28/22 1302 02/28/22 1428   02/28/22 0230  cefTRIAXone (ROCEPHIN) 1 g in sodium chloride 0.9 % 100 mL IVPB  Status:  Discontinued        1 g 200 mL/hr over 30 Minutes Intravenous Every 24 hours 02/28/22 0226 02/28/22 1302   02/28/22 0230  azithromycin (ZITHROMAX) 500 mg in sodium chloride 0.9 % 250 mL IVPB        500 mg 250 mL/hr over 60 Minutes Intravenous Every 24 hours 02/28/22 0226                Family Communication/Anticipated D/C date and plan/Code Status   DVT prophylaxis: enoxaparin (LOVENOX) injection 42.5 mg Start: 02/28/22 0800     Code Status: Full Code  Family Communication: None Disposition Plan: Plan to discharge home in 2 to 3 days   Status is: Inpatient Remains inpatient appropriate because: Septic shock on IV antibiotics       Subjective:   Interval events noted.  She complains of nausea and vomiting.  She vomited several times yesterday.  She said she vomited once this morning.  No abdominal pain, chest pain or diarrhea.  She has no urinary symptoms   Objective:    Vitals:   03/01/22 0730 03/01/22 0745 03/01/22 0815 03/01/22 1131  BP: (!) 104/56 105/61 (!) 101/55 (!) 97/58  Pulse: 88 91 87 86  Resp: 17 (!) 22 18 17   Temp:    (!) 97.2 F (36.2 C)  TempSrc:    Axillary  SpO2: 95% 100% 92% 98%  Weight:       No data found.   Intake/Output Summary (Last 24 hours) at 03/01/2022 1203 Last data filed at 03/01/2022 0551 Gross per 24 hour  Intake 2378.99 ml  Output 900 ml  Net 1478.99 ml   Filed Weights   02/28/22 0155  Weight: 83.9 kg    Exam:  GEN: NAD SKIN: Warm and dry EYES: EOMI ENT: MMM CV: RRR PULM: CTA B ABD: soft, obese, NT, +BS CNS: AAO x 3, non focal EXT: No edema or tenderness        Data Reviewed:   I have personally reviewed following labs and imaging  studies:  Labs: Labs show the following:   Basic Metabolic Panel: Recent Labs  Lab 02/27/22 1811 02/28/22 0132 02/28/22 0256 03/01/22 0734  NA 131*  --  133* 133*  K 2.6*  --  4.0 3.5  CL 94*  --  97* 98  CO2 24  --  23 28  GLUCOSE 338*  --  230* 247*  BUN 16  --  19 21  CREATININE 0.89 1.15* 1.15* 0.80  CALCIUM 8.5*  --  8.2* 7.6*  MG 1.7  --   --   --    GFR Estimated Creatinine Clearance: 65.7 mL/min (by C-G formula based on SCr of 0.8 mg/dL). Liver Function Tests: Recent Labs  Lab 03/01/22 0734  AST 23  ALT 15  ALKPHOS 57  BILITOT 1.7*  PROT 5.9*  ALBUMIN 2.5*   No results for input(s): "LIPASE", "AMYLASE" in the last 168 hours. No results for input(s): "AMMONIA" in the last 168 hours. Coagulation profile Recent Labs  Lab 02/28/22 0734  INR 1.2    CBC: Recent Labs  Lab 02/27/22 1811 02/28/22 0132 02/28/22 0733  WBC 5.8 9.6 6.8  HGB 12.2 10.5* 10.0*  HCT 37.5 32.7* 31.3*  MCV 87.4 87.9 90.5  PLT 146* 139* 125*   Cardiac Enzymes: No results for input(s): "CKTOTAL", "CKMB", "CKMBINDEX", "TROPONINI" in the last 168 hours. BNP (last 3 results) No results for input(s): "PROBNP" in the last 8760 hours. CBG: Recent Labs  Lab 02/28/22 1638 02/28/22 2234 03/01/22 0457 03/01/22 0732 03/01/22 0812  GLUCAP 244* 242* 217* 243* 230*   D-Dimer: No results for input(s): "DDIMER" in the last 72 hours. Hgb A1c: Recent Labs    02/28/22 0132  HGBA1C 9.2*   Lipid Profile: No results for input(s): "CHOL", "HDL", "LDLCALC", "TRIG", "CHOLHDL", "LDLDIRECT" in the last 72 hours. Thyroid function studies: No results for input(s): "TSH", "T4TOTAL", "T3FREE", "THYROIDAB" in the last 72 hours.  Invalid input(s): "FREET3" Anemia work up: No results for input(s): "VITAMINB12", "FOLATE", "FERRITIN", "TIBC", "IRON", "RETICCTPCT" in the last 72 hours. Sepsis Labs: Recent Labs  Lab 02/27/22 1811 02/28/22 0031 02/28/22 0132 02/28/22 0347 02/28/22 0733  03/01/22 0734  PROCALCITON  --   --  34.62  --   --   --   WBC 5.8  --  9.6  --  6.8  --   LATICACIDVEN  --  6.7*  --  2.6*  --  1.0    Microbiology Recent Results (from the past 240 hour(s))  Resp panel by RT-PCR (RSV, Flu A&B, Covid) Anterior Nasal Swab     Status: Abnormal   Collection Time: 02/27/22  9:08 PM   Specimen: Anterior Nasal Swab  Result Value Ref Range Status   SARS Coronavirus 2 by RT PCR POSITIVE (A) NEGATIVE Final    Comment: (NOTE) SARS-CoV-2 target nucleic acids are DETECTED.  The SARS-CoV-2 RNA is generally detectable in upper respiratory specimens during the acute phase of infection. Positive results are indicative of the presence of the identified virus, but do not rule out bacterial infection or co-infection with other pathogens not detected by the test. Clinical correlation with patient history and other diagnostic information is necessary to determine patient infection status. The expected result is Negative.  Fact Sheet for Patients: BloggerCourse.com  Fact Sheet for Healthcare Providers: SeriousBroker.it  This test is not yet approved or cleared by the [place] FDA and  has been authorized for detection and/or diagnosis of SARS-CoV-2 by FDA under an Emergency Use Authorization (EUA).  This EUA will remain in effect (meaning this test can be used) for the duration of  the COVID-19 declaration under Section 564(b)(1) of the A ct, 21 U.S.C. section 360bbb-3(b)(1), unless the authorization is terminated or revoked sooner.     Influenza A by PCR NEGATIVE NEGATIVE Final   Influenza B by PCR NEGATIVE NEGATIVE Final    Comment: (NOTE) The Xpert Xpress SARS-CoV-2/FLU/RSV plus assay is intended as an aid in the diagnosis of  influenza from Nasopharyngeal swab specimens and should not be used as a sole basis for treatment. Nasal washings and aspirates are unacceptable for Xpert Xpress  SARS-CoV-2/FLU/RSV testing.  Fact Sheet for Patients: EntrepreneurPulse.com.au  Fact Sheet for Healthcare Providers: IncredibleEmployment.be  This test is not yet approved or cleared by the [place] FDA and has been authorized for detection and/or diagnosis of SARS-CoV-2 by FDA under an Emergency Use Authorization (EUA). This EUA will remain in effect (meaning this test can be used) for the duration of the COVID-19 declaration under Section 564(b)(1) of the Act, 21 U.S.C. section 360bbb-3(b)(1), unless the authorization is terminated or revoked.     Resp Syncytial Virus by PCR NEGATIVE NEGATIVE Final    Comment: (NOTE) Fact Sheet for Patients: EntrepreneurPulse.com.au  Fact Sheet for Healthcare Providers: IncredibleEmployment.be  This test is not yet approved or cleared by the [place] FDA and has been authorized for detection and/or diagnosis of SARS-CoV-2 by FDA under an Emergency Use Authorization (EUA). This EUA will remain in effect (meaning this test can be used) for the duration of the COVID-19 declaration under Section 564(b)(1) of the Act, 21 U.S.C. section 360bbb-3(b)(1), unless the authorization is terminated or revoked.  Performed at [facility], [address] [place], [place] [address]   Culture, blood (x 2)     Status: None (Preliminary result)   Collection Time: 02/28/22 12:31 AM   Specimen: BLOOD  Result Value Ref Range Status   Specimen Description   Final    BLOOD BLOOD LEFT WRIST Performed at [facility], [address], [place], [place] [address]    Special Requests   Final    BOTTLES DRAWN AEROBIC AND ANAEROBIC Blood Culture results may not be optimal due to an inadequate volume of blood received in culture bottles Performed at [facility], [address] [place], [place] [address]    Culture  Setup Time   Final    Organism ID to  follow GRAM NEGATIVE RODS IN BOTH AEROBIC AND ANAEROBIC BOTTLES CRITICAL RESULT CALLED TO, READ BACK BY AND VERIFIED WITH: ALEX CHAPPELL AT 1660 02/28/22.PMF Performed at [facility], [address] [place], [place] [address]    Culture   Final    Lonell Grandchild NEGATIVE RODS IDENTIFICATION TO FOLLOW Performed at [facility], [place] [address] [place],  [address]    Report Status PENDING  Incomplete  Blood Culture ID Panel (Reflexed)     Status: Abnormal   Collection Time: 02/28/22 12:31 AM  Result Value Ref Range Status   Enterococcus faecalis NOT DETECTED NOT DETECTED Final   Enterococcus Faecium NOT DETECTED NOT DETECTED Final   Listeria monocytogenes NOT DETECTED NOT DETECTED Final   Staphylococcus species NOT DETECTED NOT DETECTED Final   Staphylococcus aureus (BCID) NOT DETECTED NOT DETECTED Final   Staphylococcus epidermidis NOT DETECTED NOT DETECTED Final   Staphylococcus lugdunensis NOT DETECTED NOT DETECTED Final   Streptococcus species NOT DETECTED NOT DETECTED Final   Streptococcus agalactiae NOT DETECTED NOT DETECTED Final   Streptococcus pneumoniae NOT DETECTED NOT DETECTED Final   Streptococcus pyogenes NOT DETECTED NOT DETECTED Final   A.calcoaceticus-baumannii NOT DETECTED NOT DETECTED Final   Bacteroides fragilis NOT DETECTED NOT DETECTED Final   Enterobacterales DETECTED (A) NOT DETECTED Final    Comment: Enterobacterales represent a large order of gram negative bacteria, not a single organism. CRITICAL RESULT CALLED TO, READ BACK BY AND VERIFIED WITH: ALEX CHAPPELL AT 2355 02/28/22.PMF    Enterobacter cloacae  complex NOT DETECTED NOT DETECTED Final   Escherichia coli DETECTED (A) NOT DETECTED Final    Comment: CRITICAL RESULT CALLED TO, READ BACK BY AND VERIFIED WITH: ALEX CHAPPELL AT 1259 02/28/22.PMF    Klebsiella aerogenes NOT DETECTED NOT DETECTED Final   Klebsiella oxytoca NOT DETECTED NOT DETECTED Final   Klebsiella pneumoniae NOT DETECTED  NOT DETECTED Final   Proteus species NOT DETECTED NOT DETECTED Final   Salmonella species NOT DETECTED NOT DETECTED Final   Serratia marcescens NOT DETECTED NOT DETECTED Final   Haemophilus influenzae NOT DETECTED NOT DETECTED Final   Neisseria meningitidis NOT DETECTED NOT DETECTED Final   Pseudomonas aeruginosa NOT DETECTED NOT DETECTED Final   Stenotrophomonas maltophilia NOT DETECTED NOT DETECTED Final   Candida albicans NOT DETECTED NOT DETECTED Final   Candida auris NOT DETECTED NOT DETECTED Final   Candida glabrata NOT DETECTED NOT DETECTED Final   Candida krusei NOT DETECTED NOT DETECTED Final   Candida parapsilosis NOT DETECTED NOT DETECTED Final   Candida tropicalis NOT DETECTED NOT DETECTED Final   Cryptococcus neoformans/gattii NOT DETECTED NOT DETECTED Final   CTX-M ESBL NOT DETECTED NOT DETECTED Final   Carbapenem resistance IMP NOT DETECTED NOT DETECTED Final   Carbapenem resistance KPC NOT DETECTED NOT DETECTED Final   Carbapenem resistance NDM NOT DETECTED NOT DETECTED Final   Carbapenem resist OXA 48 LIKE NOT DETECTED NOT DETECTED Final   Carbapenem resistance VIM NOT DETECTED NOT DETECTED Final    Comment: Performed at [facility], [address] [place], [place] [address]  Culture, blood (x 2)     Status: Abnormal (Preliminary result)   Collection Time: 02/28/22  1:32 AM   Specimen: BLOOD  Result Value Ref Range Status   Specimen Description   Final    BLOOD BLOOD LEFT FOREARM Performed at [facility], [address] [place], [place] [address]    Special Requests   Final    BOTTLES DRAWN AEROBIC AND ANAEROBIC Blood Culture adequate volume Performed at [facility], [address] [place], [place] [address]    Culture  Setup Time   Final    GRAM NEGATIVE RODS IN BOTH AEROBIC AND ANAEROBIC BOTTLES CRITICAL RESULT CALLED TO, READ BACK BY AND VERIFIED WITH: ALEX CHAPPELL AT 1259 02/28/22.PMF Performed at [facility],  [address] [place], [place] [address]    Culture (A)  Final    ESCHERICHIA COLI SUSCEPTIBILITIES TO FOLLOW Performed at [facility], [address] [place], [place] [address]    Report Status PENDING  Incomplete    Procedures and diagnostic studies:  ECHOCARDIOGRAM COMPLETE  Result Date: 02/28/2022    ECHOCARDIOGRAM REPORT   Patient Name:   Erika ReapHYLLIS D Bacorn Date of Exam: 02/28/2022 Medical Rec #:  [id number]       Height:       62.0 in Accession #:    [id number]      Weight:       185.0 lb Date of Birth:  26-Sep-1950       BSA:          1.849 m Patient Age:    71 years        BP:           108/70 mmHg Patient Gender: F               HR:           115 bpm. Exam Location:  [facility] Procedure: 2D Echo,  Color Doppler, Cardiac Doppler and Intracardiac            Opacification Agent Indications:     Syncope  History:         Patient has no prior history of Echocardiogram examinations.                  Risk Factors:Hypertension and HLD.  Sonographer:     L. Thornton-Maynard Referring Phys:  [id number] Andris BaumannHAZEL V DUNCAN Diagnosing Phys: Julien Nordmannimothy Gollan MD  Sonographer Comments: Suboptimal apical window. IMPRESSIONS  1. Left ventricular ejection fraction, by estimation, is 35 to 40%. Left ventricular ejection fraction by 2D MOD biplane is 41.3 %. The left ventricle has moderately decreased function. The left ventricle demonstrates global hypokinesis. Left ventricular diastolic parameters are consistent with Grade I diastolic dysfunction (impaired relaxation).  2. Right ventricular systolic function is normal. The right ventricular size is normal. There is normal pulmonary artery systolic pressure. The estimated right ventricular systolic pressure is 33.5 mmHg.  3. The mitral valve is normal in structure. Mild mitral valve regurgitation. No evidence of mitral stenosis.  4. Tricuspid valve regurgitation is mild to moderate.  5. The aortic valve is tricuspid. Aortic valve regurgitation is not visualized. No aortic  stenosis is present.  6. The inferior vena cava is normal in size with greater than 50% respiratory variability, suggesting right atrial pressure of 3 mmHg. FINDINGS  Left Ventricle: Left ventricular ejection fraction, by estimation, is 35 to 40%. Left ventricular ejection fraction by 2D MOD biplane is 41.3 %. The left ventricle has moderately decreased function. The left ventricle demonstrates global hypokinesis. Definity contrast agent was given IV to delineate the left ventricular endocardial borders. The left ventricular internal cavity size was normal in size. There is no left ventricular hypertrophy. Left ventricular diastolic parameters are consistent with Grade I diastolic dysfunction (impaired relaxation). Right Ventricle: The right ventricular size is normal. No increase in right ventricular wall thickness. Right ventricular systolic function is normal. There is normal pulmonary artery systolic pressure. The tricuspid regurgitant velocity is 2.76 m/s, and  with an assumed right atrial pressure of 3 mmHg, the estimated right ventricular systolic pressure is 33.5 mmHg. Left Atrium: Left atrial size was normal in size. Right Atrium: Right atrial size was normal in size. Pericardium: There is no evidence of pericardial effusion. Mitral Valve: The mitral valve is normal in structure. Mild mitral valve regurgitation. No evidence of mitral valve stenosis. MV peak gradient, 8.4 mmHg. The mean mitral valve gradient is 5.0 mmHg. Tricuspid Valve: The tricuspid valve is normal in structure. Tricuspid valve regurgitation is mild to moderate. No evidence of tricuspid stenosis. Aortic Valve: The aortic valve is tricuspid. Aortic valve regurgitation is not visualized. No aortic stenosis is present. Aortic valve mean gradient measures 5.5 mmHg. Aortic valve peak gradient measures 9.5 mmHg. Aortic valve area, by VTI measures 1.83 cm. Pulmonic Valve: The pulmonic valve was normal in structure. Pulmonic valve regurgitation  is not visualized. No evidence of pulmonic stenosis. Aorta: The aortic root is normal in size and structure. Venous: The inferior vena cava is normal in size with greater than 50% respiratory variability, suggesting right atrial pressure of 3 mmHg. IAS/Shunts: No atrial level shunt detected by color flow Doppler.  LEFT VENTRICLE PLAX 2D                        Biplane EF (MOD) LVIDd:         4.90 cm  LV Biplane EF:   Left LVIDs:         4.30 cm                          ventricular LV PW:         0.90 cm                          ejection LV IVS:        1.00 cm                          fraction by LVOT diam:     1.90 cm                          2D MOD LV SV:         46                               biplane is LV SV Index:   25                               41.3 %. LVOT Area:     2.84 cm                                Diastology                                LV e' medial:    6.31 cm/s LV Volumes (MOD)               LV E/e' medial:  17.6 LV vol d, MOD    65.6 ml       LV e' lateral:   9.79 cm/s A2C:                           LV E/e' lateral: 11.3 LV vol d, MOD    72.5 ml A4C: LV vol s, MOD    36.3 ml A2C: LV vol s, MOD    46.9 ml A4C: LV SV MOD A2C:   29.3 ml LV SV MOD A4C:   72.5 ml LV SV MOD BP:    29.7 ml RIGHT VENTRICLE RV Basal diam:  3.30 cm RV S prime:     14.90 cm/s TAPSE (M-mode): 1.8 cm LEFT ATRIUM             Index        RIGHT ATRIUM           Index LA diam:        3.40 cm 1.84 cm/m   RA Area:     14.90 cm LA Vol (A2C):   54.7 ml 29.58 ml/m  RA Volume:   39.80 ml  21.52 ml/m LA Vol (A4C):   54.1 ml 29.25 ml/m LA Biplane Vol: 54.2 ml 29.31 ml/m  AORTIC VALVE                     PULMONIC VALVE AV Area (Vmax):    1.88 cm      PV Vmax:  1.04 m/s AV Area (Vmean):   1.79 cm      PV Peak grad:  4.3 mmHg AV Area (VTI):     1.83 cm AV Vmax:           154.00 cm/s AV Vmean:          113.500 cm/s AV VTI:            0.250 m AV Peak Grad:      9.5 mmHg AV Mean Grad:      5.5 mmHg LVOT Vmax:          102.00 cm/s LVOT Vmean:        71.800 cm/s LVOT VTI:          0.161 m LVOT/AV VTI ratio: 0.65  AORTA Ao Root diam: 2.90 cm Ao Asc diam:  3.20 cm MITRAL VALVE                  TRICUSPID VALVE MV Area (PHT): 5.88 cm       TR Peak grad:   30.5 mmHg MV Area VTI:   2.15 cm       TR Vmax:        276.00 cm/s MV Peak grad:  8.4 mmHg MV Mean grad:  5.0 mmHg       SHUNTS MV Vmax:       1.45 m/s       Systemic VTI:  0.16 m MV Vmean:      103.0 cm/s     Systemic Diam: 1.90 cm MV Decel Time: 129 msec MR Peak grad:    56.9 mmHg MR Mean grad:    40.0 mmHg MR Vmax:         377.00 cm/s MR Vmean:        301.0 cm/s MR PISA:         1.01 cm MR PISA Eff ROA: 9 mm MR PISA Radius:  0.40 cm MV E velocity: 111.00 cm/s MV A velocity: 128.00 cm/s MV E/A ratio:  0.87 Julien Nordmann MD Electronically signed by Julien Nordmann MD Signature Date/Time: 02/28/2022/2:37:13 PM    Final    CT Angio Chest Pulmonary Embolism (PE) W or WO Contrast  Result Date: 02/28/2022 CLINICAL DATA:  Syncope EXAM: CT ANGIOGRAPHY CHEST WITH CONTRAST TECHNIQUE: Multidetector CT imaging of the chest was performed using the standard protocol during bolus administration of intravenous contrast. Multiplanar CT image reconstructions and MIPs were obtained to evaluate the vascular anatomy. RADIATION DOSE REDUCTION: This exam was performed according to the departmental dose-optimization program which includes automated exposure control, adjustment of the mA and/or kV according to patient size and/or use of iterative reconstruction technique. CONTRAST:  45mL OMNIPAQUE IOHEXOL 350 MG/ML SOLN COMPARISON:  None Available. FINDINGS: Cardiovascular: There is adequate opacification of the pulmonary arterial tree. No intraluminal filling defect identified to suggest acute pulmonary embolism. The central pulmonary arteries are of normal caliber. Moderate coronary artery calcification within the left anterior descending coronary artery. Global cardiac size within normal limits.  No pericardial effusion. Mild atherosclerotic calcification within the thoracic aorta. No aortic aneurysm. Mediastinum/Nodes: No enlarged mediastinal, hilar, or axillary lymph nodes. Thyroid gland, trachea, and esophagus demonstrate no significant findings. Lungs/Pleura: Scattered bilateral ground-glass opacity likely relates to atelectasis due to expiratory phase imaging. No definite superimposed focal pulmonary nodule or infiltrate. No pneumothorax or pleural effusion. No central obstructing lesion. Upper Abdomen: No acute abnormality. Musculoskeletal: No chest wall abnormality. No acute or significant osseous findings. Review of the MIP images  confirms the above findings. IMPRESSION: 1. No pulmonary embolism. 2. Moderate coronary artery calcification within the left anterior descending coronary artery. 3. Scattered bilateral ground-glass opacity likely relates to atelectasis due to expiratory phase imaging. No definite superimposed focal pulmonary nodule or infiltrate. Aortic Atherosclerosis (ICD10-I70.0). Electronically Signed   By: Helyn Numbers M.D.   On: 02/28/2022 02:13   DG Chest Portable 1 View  Result Date: 02/27/2022 CLINICAL DATA:  Shortness of breath. EXAM: PORTABLE CHEST 1 VIEW COMPARISON:  None Available. FINDINGS: No focal consolidation, pleural effusion, or pneumothorax. Faint focal area of increased density in the right suprahilar region measuring approximately 1.7 x 1.9 cm, likely interstitial or vascular crowding. CT may provide better evaluation on a nonemergent/outpatient basis. The cardiac silhouette is within normal limits. No acute osseous pathology. IMPRESSION: 1. No acute cardiopulmonary process. 2. Faint focal area of increased density in the right suprahilar region. Follow-up with chest radiograph or CT on a nonemergent/outpatient basis recommended. Electronically Signed   By: Elgie Collard M.D.   On: 02/27/2022 21:53               LOS: 1 day   Juanluis Guastella  [facility]   Pager on [email or web address]. If 7PM-7AM, please contact night-coverage at [email or web address]     03/01/2022, 12:03 PM

## 2022-03-01 NOTE — Consult Note (Signed)
NAME:  Erika Perry, MRN:  [id number], DOB:  09-Jul-1950, LOS: 1 ADMISSION DATE:  02/27/2022, CONSULTATION DATE: 02/28/22 REFERRING MD: Dr. Mal Misty, CHIEF COMPLAINT: Hypotension    History of Present Illness:  This is a 72 yo female who presented to [facility] ER on 01/19 via EMS with sudden onset of dizziness mostly with standing (felt like she was going to pass out) and confusion.  She reported when she lays down symptoms improve.  Pt also endorsed dysuria a few days prior to presentation.  EMS reported when they arrived pt hypoxic with O2 sats 87% on RA and hyperglycemic CBG 417.  She received 500 ml NS bolus per EMS.   ED Course  Upon arrival to the ER pt alert and oriented.  EKG revealed nsr, hr 90, and no ST elevation or abnormality.  Pt was positive for orthostatics and received 1L IV fluid bolus.  Lab results significant for Na+ 131/K+ 2.6/glucose 338/calcium 8.5/troponin 29/platelets 146.  She received po and iv K+ repletion.  CXR negative, however COVID-19 positive.  Pt subsequently admitted to the progressive care unit per hospitalist team. See detailed hospital course below.  CTA Chest:  No pulmonary embolism. Moderate coronary artery calcification within the left anterior descending coronary artery. Scattered bilateral ground-glass opacity likely relates to atelectasis due to expiratory phase imaging. No definite superimposed focal pulmonary nodule or infiltrate. Aortic Atherosclerosis (ICD10-I70.0).  Pertinent  Medical History  HLD HTN Pre-diabetes Vitamin D Deficiency  Closed Fracture of the Right Distal Humerus   Significant Hospital Events: Including procedures, antibiotic start and stop dates in addition to other pertinent events   01/20: Pt admitted to the progressive care unit with severe sepsis, and acute respiratory failure secondary to COVID-19, postural dizziness with presyncope, UTI.  However, she remained in the ER pending bed availability  01/20: Pt developed worsening  septic shock despite aggressive iv fluid resuscitation requiring levophed gtt.  PCCM team consulted to assist with management  1/21 weaned off pressors,+nausea  Interim History / Subjective:  Responding well to IVF's Off pressors Weaned off oxygen NAD +nausea better with Zofran   Objective   Blood pressure 105/61, pulse 91, temperature 98.4 F (36.9 C), temperature source Axillary, resp. rate (!) 22, weight 83.9 kg, SpO2 100 %.        Intake/Output Summary (Last 24 hours) at 03/01/2022 0809 Last data filed at 03/01/2022 0551 Gross per 24 hour  Intake 2378.99 ml  Output 900 ml  Net 1478.99 ml    Filed Weights   02/28/22 0155  Weight: 83.9 kg      Review of Systems: Gen:  Denies  fever, sweats, chills weight loss  +NAUSEA Cardiac:  No dizziness, chest pain or heaviness, chest tightness,edema, No JVD Resp:   No cough, -sputum production, -shortness of breath,-wheezing, -hemoptysis,  Other:  All other systems negative   Examination: General: Acutely ill appearing female, NAD  Lungs: Clear throughout, even, non labored  Cardiovascular: Sinus tachycardia, s1s2, no r/g, 2+ radial/1+ distal pulses, no edema  Abdomen: +BS x4, soft, obese, non tender Neuro: Alert and oriented, follows commands, PERRLA      Assessment & Plan:  Acute hypoxic respiratory failure secondary to COVID-19-RESOLVING - Supplemental O2 for dyspnea and/or hypoxia  - Maintain O2 sats >92% - Continue scheduled and prn bronchodilator therapy   Septic shock -RESOLVING Elevated troponin likely demand ischemia in the setting of acute illness  Hx: HLD and HTN  - Continuous telemetry monitoring  - Cardiology consulted appreciate input:  no plans for ischemic workup at this time and no indication for heparin gtt for now  - Trend troponin's until peaked  - Aggressive iv fluid resuscitation, scheduled midodrine, and prn levophed gtt to maintain map >65 - Continue outpatient rosuvastatin  - Hold outpatient  antihypertensives   Severe sepsis secondary to COVID-19 and ecoli bacteremia likely secondary to UTI  E coli bacteremia - Trend WBC and monitor fever curve  - Trend PCT  - Continue ceftriaxone and azithromycin pending sensitivities   No longer needs SD status, can go to PCU   Best Practice (right click and "Reselect all SmartList Selections" daily)   Diet/type: Regular consistency (see orders) DVT prophylaxis: LMWH GI prophylaxis: N/A Lines: N/A Foley:  N/A Code Status:  full code   Labs   CBC: Recent Labs  Lab 02/27/22 1811 02/28/22 0132  WBC 5.8 9.6  HGB 12.2 10.5*  HCT 37.5 32.7*  MCV 87.4 87.9  PLT 146* 139*     Basic Metabolic Panel: Recent Labs  Lab 02/27/22 1811 02/28/22 0132 02/28/22 0256  NA 131*  --  133*  K 2.6*  --  4.0  CL 94*  --  97*  CO2 24  --  23  GLUCOSE 338*  --  230*  BUN 16  --  19  CREATININE 0.89 1.15* 1.15*  CALCIUM 8.5*  --  8.2*  MG 1.7  --   --     GFR: Estimated Creatinine Clearance: 45.7 mL/min (A) (by C-G formula based on SCr of 1.15 mg/dL (H)). Recent Labs  Lab 02/27/22 1811 02/28/22 0031 02/28/22 0132 02/28/22 0347 03/01/22 0734  PROCALCITON  --   --  34.62  --   --   WBC 5.8  --  9.6  --   --   LATICACIDVEN  --  6.7*  --  2.6* 1.0       Cardiac Enzymes: No results for input(s): "CKTOTAL", "CKMB", "CKMBINDEX", "TROPONINI" in the last 168 hours.  HbA1C: Hgb A1c MFr Bld  Date/Time Value Ref Range Status  02/28/2022 01:32 AM 9.2 (H) 4.8 - 5.6 % Final    Comment:    (NOTE) Pre diabetes:          5.7%-6.4%  Diabetes:              >6.4%  Glycemic control for   <7.0% adults with diabetes   12/23/2021 09:44 AM 11.1 (H) 4.6 - 6.5 % Final    Comment:    Glycemic Control Guidelines for People with Diabetes:Non Diabetic:  <6%Goal of Therapy: <7%Additional Action Suggested:  >8%     CBG: Recent Labs  Lab 02/28/22 1230 02/28/22 1638 02/28/22 2234 03/01/22 0457 03/01/22 0732  GLUCAP 221* 244* 242* 217*  243*    Past Medical History:  She,  has a past medical history of BMI 35.0-35.9,adult (07/17/2020), Closed fracture of right distal humerus (07/12/2021), HLD (hyperlipidemia), Hypertension, Pre-diabetes, and Vitamin D deficiency (07/13/2021).  Allergies Allergies  Allergen Reactions   Latex     Pt states that she is allergic to latex in pre-op.  Regardless of allergy type latex will not be used.    Bactrim [Sulfamethoxazole-Trimethoprim]     Nausea, no appitite   Penicillins     Tolerated Cephalosporin Date: 07/11/2021.         Corrin Parker, M.D.  [facility]  Medical Director [place] Director [facility] Cardio-Pulmonary Department

## 2022-03-01 NOTE — Progress Notes (Signed)
Progress Note  Patient Name: Erika Perry Date of Encounter: 03/01/2022  Primary Cardiologist: new - consult by Rockey Situ   Subjective   Dyspnea much improved. No chest pain or palpitations.  Remains on supplemental oxygen via nasal cannula at 2.5 L.  Family at bedside. Echo yesterday showed an EF of 35-40% with global hypokinesis, Gr1DD, normal RV systolic function and ventricular cavity size, PASP 33.5 mmHg, mild MR, mild to moderate TR.   Inpatient Medications    Scheduled Meds:  enoxaparin (LOVENOX) injection  0.5 mg/kg Subcutaneous Q24H   insulin aspart  0-15 Units Subcutaneous TID WC   insulin aspart  0-5 Units Subcutaneous QHS   midodrine  5 mg Oral TID WC   potassium chloride  40 mEq Oral Daily   rosuvastatin  20 mg Oral Daily   sodium chloride flush  3 mL Intravenous Q12H   Continuous Infusions:  sodium chloride Stopped (02/28/22 1430)   azithromycin Stopped (03/01/22 0551)   cefTRIAXone (ROCEPHIN)  IV     norepinephrine (LEVOPHED) Adult infusion Stopped (03/01/22 0553)   promethazine (PHENERGAN) injection (IM or IVPB) Stopped (02/28/22 1747)   PRN Meds: acetaminophen **OR** acetaminophen, albuterol, HYDROcodone-acetaminophen, morphine injection, ondansetron (ZOFRAN) IV, promethazine (PHENERGAN) injection (IM or IVPB)   Vital Signs    Vitals:   03/01/22 0700 03/01/22 0730 03/01/22 0745 03/01/22 0815  BP: (!) 100/51 (!) 104/56 105/61 (!) 101/55  Pulse: 87 88 91 87  Resp: 11 17 (!) 22 18  Temp:      TempSrc:      SpO2: 97% 95% 100% 92%  Weight:        Intake/Output Summary (Last 24 hours) at 03/01/2022 0925 Last data filed at 03/01/2022 0551 Gross per 24 hour  Intake 2378.99 ml  Output 900 ml  Net 1478.99 ml   Filed Weights   02/28/22 0155  Weight: 83.9 kg    Telemetry    SR, 90s bpm - Personally Reviewed  ECG    No new tracings - Personally Reviewed  Physical Exam   GEN: No acute distress.   Neck: No JVD. Cardiac: RRR, no murmurs, rubs, or  gallops.  Respiratory: Diminished and coarse breath sounds bilaterally.  GI: Soft, nontender, non-distended.   MS: No edema; No deformity. Neuro:  Alert and oriented x 3; Nonfocal.  Psych: Normal affect.  Labs    Chemistry Recent Labs  Lab 02/27/22 1811 02/28/22 0132 02/28/22 0256 03/01/22 0734  NA 131*  --  133* 133*  K 2.6*  --  4.0 3.5  CL 94*  --  97* 98  CO2 24  --  23 28  GLUCOSE 338*  --  230* 247*  BUN 16  --  19 21  CREATININE 0.89 1.15* 1.15* 0.80  CALCIUM 8.5*  --  8.2* 7.6*  PROT  --   --   --  5.9*  ALBUMIN  --   --   --  2.5*  AST  --   --   --  23  ALT  --   --   --  15  ALKPHOS  --   --   --  57  BILITOT  --   --   --  1.7*  GFRNONAA >60 51* 51* >60  ANIONGAP 13  --  13 7     Hematology Recent Labs  Lab 02/27/22 1811 02/28/22 0132 02/28/22 0733  WBC 5.8 9.6 6.8  RBC 4.29 3.72* 3.46*  HGB 12.2 10.5* 10.0*  HCT 37.5  32.7* 31.3*  MCV 87.4 87.9 90.5  MCH 28.4 28.2 28.9  MCHC 32.5 32.1 31.9  RDW 17.8* 18.0* 18.2*  PLT 146* 139* 125*    Cardiac EnzymesNo results for input(s): "TROPONINI" in the last 168 hours. No results for input(s): "TROPIPOC" in the last 168 hours.   BNPNo results for input(s): "BNP", "PROBNP" in the last 168 hours.   DDimer No results for input(s): "DDIMER" in the last 168 hours.   Radiology    CT Angio Chest Pulmonary Embolism (PE) W or WO Contrast  Result Date: 02/28/2022 IMPRESSION: 1. No pulmonary embolism. 2. Moderate coronary artery calcification within the left anterior descending coronary artery. 3. Scattered bilateral ground-glass opacity likely relates to atelectasis due to expiratory phase imaging. No definite superimposed focal pulmonary nodule or infiltrate. Aortic Atherosclerosis (ICD10-I70.0). Electronically Signed   By: Helyn Numbers M.D.   On: 02/28/2022 02:13   DG Chest Portable 1 View  Result Date: 02/27/2022 IMPRESSION: 1. No acute cardiopulmonary process. 2. Faint focal area of increased density in  the right suprahilar region. Follow-up with chest radiograph or CT on a nonemergent/outpatient basis recommended. Electronically Signed   By: Elgie Collard M.D.   On: 02/27/2022 21:53    Cardiac Studies   2D echo 02/28/2022: 1. Left ventricular ejection fraction, by estimation, is 35 to 40%. Left  ventricular ejection fraction by 2D MOD biplane is 41.3 %. The left  ventricle has moderately decreased function. The left ventricle  demonstrates global hypokinesis. Left  ventricular diastolic parameters are consistent with Grade I diastolic  dysfunction (impaired relaxation).   2. Right ventricular systolic function is normal. The right ventricular  size is normal. There is normal pulmonary artery systolic pressure. The  estimated right ventricular systolic pressure is 33.5 mmHg.   3. The mitral valve is normal in structure. Mild mitral valve  regurgitation. No evidence of mitral stenosis.   4. Tricuspid valve regurgitation is mild to moderate.   5. The aortic valve is tricuspid. Aortic valve regurgitation is not  visualized. No aortic stenosis is present.   6. The inferior vena cava is normal in size with greater than 50%  respiratory variability, suggesting right atrial pressure of 3 mmHg.   Patient Profile     72 y.o. female with history of uncontrolled DM, HTN, and HLD who was admitted with severe sepsis secondary to Covid infection and UTI and is being seen today for the evaluation of mildly elevated high sensitivity troponin at the request of Dr. Para March, found to have acute HFrEF.  Assessment & Plan    Acute HFrEF: -Appears largely euvolemic and well compensated -Chronicity and etiology unclear at this time -Hypotension precludes escalation of GDMT at this time, escalate as able -Will need R/LHC once improved from acute Covid illness  Coronary artery calcification with mildly elevated high-sensitivity troponin: -Never with chest pain  -EKG with nonspecific ST-T  changes -Mildly elevated high-sensitivity troponin is flat trending and not consistent with ACS, no indication for heparin drip without dynamic troponin elevation -Echo with cardiomyopathy of uncertain chronicity  -Will need cardiac cath when improved as above -LDL of 25 in 12/2021 -ASA -Rosuvastatin   Acute hypoxic respiratory failure with severe sepsis secondary to COVID infection: -Improving -Remains on supplemental oxygen, wean as able -Appreciate CCM and IM assistance    Orthostatic hypotension: -Likely exacerbated by her severe sepsis and COVID infection -IV hydration -Obtain orthostatics  -Would attempt to avoid midodrine given cardiomyopathy -Now off Levophed   Hypokalemia: -Repleted  HTN: -BP soft in the setting of severe sepsis with COVID infection and UTI as well as cardiomyopathy   Poorly controlled diabetes: -Last A1c 11.1 -Management per primary service       For questions or updates, please contact [place] Please consult [email or web address] for contact info under Cardiology/STEMI.    Signed, Christell Faith, PA-C [place] Pager: [phone] 03/01/2022, 9:25 AM

## 2022-03-01 NOTE — ED Notes (Signed)
Verbal report given to Linus Orn, RN face to face

## 2022-03-01 NOTE — Consult Note (Signed)
ANTICOAGULATION CONSULT NOTE - Initial Consult  Pharmacy Consult for heparin Indication: chest pain/ACS  Allergies  Allergen Reactions   Latex     Pt states that she is allergic to latex in pre-op.  Regardless of allergy type latex will not be used.    Bactrim [Sulfamethoxazole-Trimethoprim]     Nausea, no appitite   Penicillins     Tolerated Cephalosporin Date: 07/11/2021.      Patient Measurements: Height: 5\' 2"  (157.5 cm) Weight: 83.9 kg (184 lb 15.5 oz) IBW/kg (Calculated) : 50.1 Heparin Dosing Weight: 69  Vital Signs: Temp: 99.2 F (37.3 C) (01/21 2130) Temp Source: Oral (01/21 1654) BP: 117/64 (01/21 2130) Pulse Rate: 103 (01/21 2130)  Labs: Recent Labs    02/27/22 1811 02/27/22 2232 02/28/22 0132 02/28/22 0256 02/28/22 0733 02/28/22 0734 02/28/22 1454 03/01/22 0734 03/01/22 2008  HGB 12.2  --  10.5*  --  10.0*  --   --   --   --   HCT 37.5  --  32.7*  --  31.3*  --   --   --   --   PLT 146*  --  139*  --  125*  --   --   --   --   APTT  --   --   --   --   --  27  --   --   --   LABPROT  --   --   --   --   --  15.1  --   --   --   INR  --   --   --   --   --  1.2  --   --   --   HEPARINUNFRC  --   --   --   --   --   --   --   --  0.16*  CREATININE 0.89  --  1.15* 1.15*  --   --   --  0.80  --   TROPONINIHS 29*   < >  --  135*  --   --  130* 333*  --    < > = values in this interval not displayed.     Estimated Creatinine Clearance: 65.7 mL/min (by C-G formula based on SCr of 0.8 mg/dL).   Medical History: Past Medical History:  Diagnosis Date   BMI 35.0-35.9,adult 07/17/2020   Closed fracture of right distal humerus 07/12/2021   HLD (hyperlipidemia)    Hypertension    Pre-diabetes    Vitamin D deficiency 07/13/2021    Medications:  Enoxaparin >> Heparin  Assessment: 72 yo F with PMH DM, HTN presents with dizziness and AMS and a 1-week history of cough. On arrival, O2Sat was 87%. ECG showed sinus tachycardia and cTn has been trending up, from  29 >> 130 >> 333. Pharmacy has been consulted to initiate heparin for 48 hrs in the setting of chest pain/ACS. Patient has been taking prophylactic enoxaparin in the ED. No chronic anticoagulation PTA noted from chart review. Hgb and PLT has trended down mildly since admission.  Heparin is intended to run for 48 hrs.  Goal of Therapy:  Heparin level 0.3-0.7 units/ml Monitor platelets by anticoagulation protocol: Yes  Baseline Labs: aPTT - 27; INR - 1.2 Hgb - 10.0; Plts - 125  Date Time aPTT/HL Rate/Comment 1/21     2008     0.16                SUBtherapeutic @  850 units/hr   Plan:  1/21:  HL @ 2008 = 0.16, SUBtherapeutic  - Will order heparin 2050 units IV X 1 bolus and increase drip rate to 1100 units/hr. -  Will recheck HL 6 hrs after rate change.  Continue to monitor H&H and platelets daily while on heparin gtt.  Lamis Behrmann D 03/01/2022 9:43 PM

## 2022-03-02 DIAGNOSIS — I5020 Unspecified systolic (congestive) heart failure: Secondary | ICD-10-CM | POA: Diagnosis present

## 2022-03-02 DIAGNOSIS — I214 Non-ST elevation (NSTEMI) myocardial infarction: Secondary | ICD-10-CM | POA: Diagnosis not present

## 2022-03-02 DIAGNOSIS — R7881 Bacteremia: Secondary | ICD-10-CM | POA: Diagnosis not present

## 2022-03-02 DIAGNOSIS — I5021 Acute systolic (congestive) heart failure: Secondary | ICD-10-CM

## 2022-03-02 DIAGNOSIS — R6521 Severe sepsis with septic shock: Secondary | ICD-10-CM | POA: Diagnosis not present

## 2022-03-02 DIAGNOSIS — A419 Sepsis, unspecified organism: Secondary | ICD-10-CM | POA: Diagnosis not present

## 2022-03-02 LAB — CBC WITH DIFFERENTIAL/PLATELET
Abs Immature Granulocytes: 0.04 10*3/uL (ref 0.00–0.07)
Basophils Absolute: 0 10*3/uL (ref 0.0–0.1)
Basophils Relative: 0 %
Eosinophils Absolute: 0 10*3/uL (ref 0.0–0.5)
Eosinophils Relative: 0 %
HCT: 31.1 % — ABNORMAL LOW (ref 36.0–46.0)
Hemoglobin: 9.7 g/dL — ABNORMAL LOW (ref 12.0–15.0)
Immature Granulocytes: 1 %
Lymphocytes Relative: 24 %
Lymphs Abs: 1.3 10*3/uL (ref 0.7–4.0)
MCH: 28.4 pg (ref 26.0–34.0)
MCHC: 31.2 g/dL (ref 30.0–36.0)
MCV: 90.9 fL (ref 80.0–100.0)
Monocytes Absolute: 0.7 10*3/uL (ref 0.1–1.0)
Monocytes Relative: 12 %
Neutro Abs: 3.5 10*3/uL (ref 1.7–7.7)
Neutrophils Relative %: 63 %
Platelets: 125 10*3/uL — ABNORMAL LOW (ref 150–400)
RBC: 3.42 MIL/uL — ABNORMAL LOW (ref 3.87–5.11)
RDW: 17.8 % — ABNORMAL HIGH (ref 11.5–15.5)
WBC: 5.5 10*3/uL (ref 4.0–10.5)
nRBC: 0 % (ref 0.0–0.2)

## 2022-03-02 LAB — CULTURE, BLOOD (ROUTINE X 2): Special Requests: ADEQUATE

## 2022-03-02 LAB — BASIC METABOLIC PANEL
Anion gap: 7 (ref 5–15)
BUN: 14 mg/dL (ref 8–23)
CO2: 28 mmol/L (ref 22–32)
Calcium: 7.8 mg/dL — ABNORMAL LOW (ref 8.9–10.3)
Chloride: 99 mmol/L (ref 98–111)
Creatinine, Ser: 0.77 mg/dL (ref 0.44–1.00)
GFR, Estimated: >60 mL/min (ref >60)
Glucose, Bld: 148 mg/dL — ABNORMAL HIGH (ref 70–99)
Potassium: 3.4 mmol/L — ABNORMAL LOW (ref 3.5–5.1)
Sodium: 134 mmol/L — ABNORMAL LOW (ref 135–145)

## 2022-03-02 LAB — CBG MONITORING, ED
Glucose-Capillary: 145 mg/dL — ABNORMAL HIGH (ref 70–99)
Glucose-Capillary: 150 mg/dL — ABNORMAL HIGH (ref 70–99)
Glucose-Capillary: 164 mg/dL — ABNORMAL HIGH (ref 70–99)

## 2022-03-02 LAB — MAGNESIUM: Magnesium: 1.8 mg/dL (ref 1.7–2.4)

## 2022-03-02 LAB — PHOSPHORUS: Phosphorus: 2.2 mg/dL — ABNORMAL LOW (ref 2.5–4.6)

## 2022-03-02 LAB — HEPARIN LEVEL (UNFRACTIONATED)
Heparin Unfractionated: <0.1 IU/mL — ABNORMAL LOW (ref 0.30–0.70)
Heparin Unfractionated: 0.1 IU/mL — ABNORMAL LOW (ref 0.30–0.70)

## 2022-03-02 LAB — GLUCOSE, CAPILLARY
Glucose-Capillary: 168 mg/dL — ABNORMAL HIGH (ref 70–99)
Glucose-Capillary: 155 mg/dL — ABNORMAL HIGH (ref 70–99)

## 2022-03-02 MED ORDER — HEPARIN BOLUS VIA INFUSION
2000.0000 [IU] | Freq: Once | INTRAVENOUS | Status: AC
  Administered 2022-03-02: 2000 [IU] via INTRAVENOUS
  Filled 2022-03-02: qty 2000

## 2022-03-02 MED ORDER — HEPARIN BOLUS VIA INFUSION
2050.0000 [IU] | Freq: Once | INTRAVENOUS | Status: AC
  Administered 2022-03-02: 2050 [IU] via INTRAVENOUS
  Filled 2022-03-02: qty 2050

## 2022-03-02 NOTE — Progress Notes (Signed)
Progress Note  Patient Name: Erika Perry Date of Encounter: 03/02/2022  Primary Cardiologist: new - consult by Rockey Situ   Subjective   She reports improvement in shortness of breath.  She is still requiring 2 L of oxygen.  No chest pain.  Inpatient Medications    Scheduled Meds:  insulin aspart  0-15 Units Subcutaneous TID WC   insulin aspart  0-5 Units Subcutaneous QHS   potassium chloride  40 mEq Oral Daily   rosuvastatin  20 mg Oral Daily   sodium chloride flush  3 mL Intravenous Q12H   Continuous Infusions:  sodium chloride Stopped (02/28/22 1430)   cefTRIAXone (ROCEPHIN)  IV Stopped (03/02/22 1022)   heparin 1,350 Units/hr (03/02/22 7253)   norepinephrine (LEVOPHED) Adult infusion Stopped (03/01/22 0553)   promethazine (PHENERGAN) injection (IM or IVPB) Stopped (02/28/22 1747)   PRN Meds: acetaminophen **OR** acetaminophen, albuterol, HYDROcodone-acetaminophen, ondansetron (ZOFRAN) IV, promethazine (PHENERGAN) injection (IM or IVPB)   Vital Signs    Vitals:   03/02/22 0503 03/02/22 0503 03/02/22 0600 03/02/22 0800  BP:  129/66 (!) 94/56 121/60  Pulse:  93 79 82  Resp:  16  16  Temp: 99 F (37.2 C)     TempSrc: Oral     SpO2:  95% 94% 99%  Weight:      Height:       No intake or output data in the 24 hours ending 03/02/22 1312  Filed Weights   02/28/22 0155 03/01/22 1300  Weight: 83.9 kg 83.9 kg    Telemetry    SR, 90s bpm - Personally Reviewed  ECG    No new tracings - Personally Reviewed  Physical Exam   GEN: No acute distress.   Neck: Mild JVD. Cardiac: RRR, no murmurs, rubs, or gallops.  Respiratory: Diminished and coarse breath sounds bilaterally.  GI: Soft, nontender, non-distended.   MS:  No deformity.  Trace bilateral leg edema. Neuro:  Alert and oriented x 3; Nonfocal.  Psych: Normal affect.  Labs    Chemistry Recent Labs  Lab 02/28/22 0256 03/01/22 0734 03/02/22 0506  NA 133* 133* 134*  K 4.0 3.5 3.4*  CL 97* 98 99   CO2 23 28 28   GLUCOSE 230* 247* 148*  BUN 19 21 14   CREATININE 1.15* 0.80 0.77  CALCIUM 8.2* 7.6* 7.8*  PROT  --  5.9*  --   ALBUMIN  --  2.5*  --   AST  --  23  --   ALT  --  15  --   ALKPHOS  --  57  --   BILITOT  --  1.7*  --   GFRNONAA 51* >60 >60  ANIONGAP 13 7 7       Hematology Recent Labs  Lab 02/28/22 0132 02/28/22 0733 03/02/22 0506  WBC 9.6 6.8 5.5  RBC 3.72* 3.46* 3.42*  HGB 10.5* 10.0* 9.7*  HCT 32.7* 31.3* 31.1*  MCV 87.9 90.5 90.9  MCH 28.2 28.9 28.4  MCHC 32.1 31.9 31.2  RDW 18.0* 18.2* 17.8*  PLT 139* 125* 125*     Cardiac EnzymesNo results for input(s): "TROPONINI" in the last 168 hours. No results for input(s): "TROPIPOC" in the last 168 hours.   BNPNo results for input(s): "BNP", "PROBNP" in the last 168 hours.   DDimer No results for input(s): "DDIMER" in the last 168 hours.   Radiology    CT Angio Chest Pulmonary Embolism (PE) W or WO Contrast  Result Date: 02/28/2022 IMPRESSION: 1. No pulmonary  embolism. 2. Moderate coronary artery calcification within the left anterior descending coronary artery. 3. Scattered bilateral ground-glass opacity likely relates to atelectasis due to expiratory phase imaging. No definite superimposed focal pulmonary nodule or infiltrate. Aortic Atherosclerosis (ICD10-I70.0). Electronically Signed   By: Fidela Salisbury M.D.   On: 02/28/2022 02:13   DG Chest Portable 1 View  Result Date: 02/27/2022 IMPRESSION: 1. No acute cardiopulmonary process. 2. Faint focal area of increased density in the right suprahilar region. Follow-up with chest radiograph or CT on a nonemergent/outpatient basis recommended. Electronically Signed   By: Anner Crete M.D.   On: 02/27/2022 21:53    Cardiac Studies   2D echo 02/28/2022: 1. Left ventricular ejection fraction, by estimation, is 35 to 40%. Left  ventricular ejection fraction by 2D MOD biplane is 41.3 %. The left  ventricle has moderately decreased function. The left ventricle   demonstrates global hypokinesis. Left  ventricular diastolic parameters are consistent with Grade I diastolic  dysfunction (impaired relaxation).   2. Right ventricular systolic function is normal. The right ventricular  size is normal. There is normal pulmonary artery systolic pressure. The  estimated right ventricular systolic pressure is 51.7 mmHg.   3. The mitral valve is normal in structure. Mild mitral valve  regurgitation. No evidence of mitral stenosis.   4. Tricuspid valve regurgitation is mild to moderate.   5. The aortic valve is tricuspid. Aortic valve regurgitation is not  visualized. No aortic stenosis is present.   6. The inferior vena cava is normal in size with greater than 50%  respiratory variability, suggesting right atrial pressure of 3 mmHg.   Patient Profile     72 y.o. female with history of uncontrolled DM, HTN, and HLD who was admitted with severe sepsis secondary to Covid infection and UTI and is being seen today for the evaluation of mildly elevated high sensitivity troponin at the request of Dr. Damita Dunnings, found to have acute HFrEF.  Assessment & Plan    1.  Acute systolic heart failure: EF of 35 to 40%.  High risk for underlying ischemic heart disease considering her risk factors and uncontrolled diabetes. She appears to be mildly volume overloaded but will hold off on diuresis given low blood pressure. I recommend proceeding with a right and left cardiac catheterization and possible PCI tomorrow.  I discussed the procedure in details as well as risk and benefits. Acute HFrEF: -Hypotension precludes escalation of GDMT at this time, escalate as able  2.  Elevated troponin with a peak of 333: Not able to determine at this point if this is due to acute coronary syndrome or supply demand ischemia.  She is currently on heparin drip.  Left heart catheterization will help differentiate the etiology of this.  CT scan of the chest did show evidence of coronary artery  calcifications especially in the LAD distribution.   3. Acute hypoxic respiratory failure with severe sepsis secondary to COVID infection: The patient did require vasopressor support but she is currently off norepinephrine drip.  She seems to be improving.   4. Poorly controlled diabetes: -Last A1c 11.1 -Management per primary service  Total encounter time more than 50 minutes. Greater than 50% was spent in counseling and coordination of care with the patient.   For questions or updates, please contact [place] Please consult [email or web address] for contact info under Cardiology/STEMI.    Signed, Kathlyn [place], MD [facility] 03/02/2022, 1:12 PM

## 2022-03-02 NOTE — Consult Note (Signed)
ANTICOAGULATION CONSULT NOTE - Initial Consult  Pharmacy Consult for heparin Indication: chest pain/ACS  Allergies  Allergen Reactions   Latex     Pt states that she is allergic to latex in pre-op.  Regardless of allergy type latex will not be used.    Bactrim [Sulfamethoxazole-Trimethoprim]     Nausea, no appitite   Penicillins     Tolerated Cephalosporin Date: 07/11/2021.      Patient Measurements: Height: 5\' 2"  (157.5 cm) Weight: 83.9 kg (184 lb 15.5 oz) IBW/kg (Calculated) : 50.1 Heparin Dosing Weight: 69  Vital Signs: Temp: 98.1 F (36.7 C) (01/22 1351) Temp Source: Oral (01/22 1351) BP: 118/66 (01/22 1345) Pulse Rate: 92 (01/22 1345)  Labs: Recent Labs    02/28/22 0132 02/28/22 0256 02/28/22 0733 02/28/22 0734 02/28/22 1454 03/01/22 0734 03/01/22 2008 03/02/22 0506 03/02/22 1345  HGB 10.5*  --  10.0*  --   --   --   --  9.7*  --   HCT 32.7*  --  31.3*  --   --   --   --  31.1*  --   PLT 139*  --  125*  --   --   --   --  125*  --   APTT  --   --   --  27  --   --   --   --   --   LABPROT  --   --   --  15.1  --   --   --   --   --   INR  --   --   --  1.2  --   --   --   --   --   HEPARINUNFRC  --   --   --   --   --   --  0.16* <0.10* 0.10*  CREATININE 1.15* 1.15*  --   --   --  0.80  --  0.77  --   TROPONINIHS  --  135*  --   --  130* 333*  --   --   --      Estimated Creatinine Clearance: 65.7 mL/min (by C-G formula based on SCr of 0.77 mg/dL).   Medical History: Past Medical History:  Diagnosis Date   BMI 35.0-35.9,adult 07/17/2020   Closed fracture of right distal humerus 07/12/2021   HLD (hyperlipidemia)    Hypertension    Pre-diabetes    Vitamin D deficiency 07/13/2021    Medications:  Enoxaparin >> Heparin  Assessment: 72 yo F with PMH DM, HTN presents with dizziness and AMS and a 1-week history of cough. On arrival, O2Sat was 87%. ECG showed sinus tachycardia and cTn has been trending up, from 29 >> 130 >> 333. Pharmacy has been consulted  to initiate heparin for 48 hrs in the setting of chest pain/ACS. Patient has been taking prophylactic enoxaparin in the ED. No chronic anticoagulation PTA noted from chart review. Hgb and PLT has trended down mildly since admission.  Heparin is intended to run for 48 hrs.  Goal of Therapy:  Heparin level 0.3-0.7 units/ml Monitor platelets by anticoagulation protocol: Yes  Baseline Labs: aPTT - 27; INR - 1.2 Hgb - 10.0; Plts - 125  Date Time aPTT/HL Rate/Comment 1/21     2008     0.16                SUBtherapeutic @ 850 units/hr 1/22     0506     <  0.10             SUBtherapeutic @ 1100 un/hr  1/22 1345   0.10  Subtherapeutic @ 1350 units/hr    Plan: HL subtherapeutic -Order heparin 2000 units IV x 1 bolus -Increase heparin drip rate to 1600 units/hr -Check heparin level 6 hours after rate change -Daily CBC while on heparin  Lorin Picket, PharmD 03/02/2022 2:39 PM

## 2022-03-02 NOTE — Progress Notes (Addendum)
Progress Note    Erika Perry  ZOX:[id number] DOB: Jan 29, 1951  DOA: 02/27/2022 PCP: Glori LuisSonnenberg, Eric G, MD      Brief Narrative:    Medical records reviewed and are as summarized below:  Erika Perry is a 72 y.o. female with medical history significant for hypertension, type II DM, obesity, vitamin D deficiency, who presented to the hospital because of confusion and dizziness from standing.  She also reported having chills shortly before admission.  She had been coughing for over a week.  Oxygen saturation was 87% on room air when EMS arrived.   She was hypotensive on admission.  Lactic acid was 6.7, procalcitonin was 34.62, sodium was 131, potassium 2.6, urinalysis was suggestive of UTI and COVID test was positive. She was admitted to the hospital for severe sepsis secondary to acute UTI.  She was treated with IV fluids and empiric IV antibiotics.  Hypotension worsened despite treatment with IV fluids.  She was started on IV Levophed infusion for septic shock.  Intensivist was consulted to assist with management. She required 2 L/min oxygen for acute hypoxic respiratory failure from severe sepsis.  Cardiologist was consulted for elevated troponins.    Assessment/Plan:   Principal Problem:   Septic shock (HCC) Active Problems:   COVID-19   E coli bacteremia   Urinary tract infection without hematuria   Postural dizziness with presyncope   NSTEMI (non-ST elevated myocardial infarction) (HCC)   Hypokalemia   Uncontrolled type 2 diabetes mellitus with hyperglycemia, without long-term current use of insulin (HCC)   Hypertension   Elevated troponin   Demand ischemia   Dilated cardiomyopathy (HCC)   Acute systolic CHF (congestive heart failure) (HCC)    Body mass index is 33.83 kg/m.  (Obesity)  Septic shock secondary to E. coli bacteremia and UTI: Blood pressure is better.  Discontinue midodrine.  Continue IV ceftriaxone.  Lactic acid was 6.7 on admission.  Lactic  acidosis has resolved.  Initially required Levophed for septic shock.  Type II NSTEMI, demand ischemia: Probably due to demand ischemia but unable to rule out ACS completely.  She was started on IV heparin drip on 03/01/2022.  Monitor heparin level per protocol.  Plan for left heart cath tomorrow.  Acute systolic CHF: Hypotension limits the use of goal-directed medical therapy.  2D echo showed EF estimated at 35 to 40%, grade 1 diastolic dysfunction mild TR, mild MR, mild to moderate TR.   Acute hypoxic respiratory failure: Improved.  She is tolerating room air.  Use oxygen as needed.  Hypokalemia: Replete potassium and monitor levels   Thrombocytopenia: Platelet count is trending down.  Monitor closely.   Postural dizziness likely from hypotension and severe sepsis   COVID-19 infection: Cough started over a week prior to admission so patient was not treated for COVID.   Type II DM with hyperglycemia: Continue NovoLog as needed for hyperglycemia.  Hemoglobin A1c was 9.  Of note, hemoglobin A1c was 11.1 on 12/23/2021.   Hyperglycemia induced hyponatremia: Monitor BMP    Diet Order             Diet heart healthy/carb modified Room service appropriate? Yes; Fluid consistency: Thin  Diet effective now                            Consultants: Intensivist Cardiologist  Procedures: None    Medications:    heparin  2,000 Units Intravenous Once   insulin  aspart  0-15 Units Subcutaneous TID WC   insulin aspart  0-5 Units Subcutaneous QHS   potassium chloride  40 mEq Oral Daily   rosuvastatin  20 mg Oral Daily   sodium chloride flush  3 mL Intravenous Q12H   Continuous Infusions:  sodium chloride Stopped (02/28/22 1430)   cefTRIAXone (ROCEPHIN)  IV Stopped (03/02/22 1022)   heparin 1,350 Units/hr (03/02/22 2202)   promethazine (PHENERGAN) injection (IM or IVPB) Stopped (02/28/22 1747)     Anti-infectives (From admission, onward)    Start     Dose/Rate  Route Frequency Ordered Stop   03/01/22 1000  cefTRIAXone (ROCEPHIN) 2 g in sodium chloride 0.9 % 100 mL IVPB        2 g 200 mL/hr over 30 Minutes Intravenous Every 24 hours 02/28/22 1302     02/28/22 1315  cefTRIAXone (ROCEPHIN) 1 g in sodium chloride 0.9 % 100 mL IVPB        1 g 200 mL/hr over 30 Minutes Intravenous  Once 02/28/22 1302 02/28/22 1428   02/28/22 0230  cefTRIAXone (ROCEPHIN) 1 g in sodium chloride 0.9 % 100 mL IVPB  Status:  Discontinued        1 g 200 mL/hr over 30 Minutes Intravenous Every 24 hours 02/28/22 0226 02/28/22 1302   02/28/22 0230  azithromycin (ZITHROMAX) 500 mg in sodium chloride 0.9 % 250 mL IVPB  Status:  Discontinued        500 mg 250 mL/hr over 60 Minutes Intravenous Every 24 hours 02/28/22 0226 03/01/22 1222              Family Communication/Anticipated D/C date and plan/Code Status   DVT prophylaxis: heparin bolus via infusion 2,000 Units Start: 03/02/22 1515     Code Status: Full Code  Family Communication: Plan discussed with Dois Davenport, daughter, over the phone Disposition Plan: Plan to discharge home in 2 to 3 days   Status is: Inpatient Remains inpatient appropriate because: Septic shock on IV antibiotics       Subjective:   Interval events noted.  No nausea, vomiting, chest pain or shortness of breath.  She feels better today.  Objective:    Vitals:   03/02/22 0800 03/02/22 1300 03/02/22 1345 03/02/22 1351  BP: 121/60 (!) 147/55 118/66   Pulse: 82 80 92   Resp: 16 16 17    Temp:    98.1 F (36.7 C)  TempSrc:    Oral  SpO2: 99% 99% 94%   Weight:      Height:       No data found.  No intake or output data in the 24 hours ending 03/02/22 1517  Filed Weights   02/28/22 0155 03/01/22 1300  Weight: 83.9 kg 83.9 kg    Exam:  GEN: NAD SKIN: Warm and dry EYES: No pallor or icterus ENT: MMM CV: RRR PULM: Decreased breath sounds bilaterally, no rales or wheezing ABD: soft, ND, NT, +BS CNS: AAO x 3, non  focal EXT: No edema or tenderness         Data Reviewed:   I have personally reviewed following labs and imaging studies:  Labs: Labs show the following:   Basic Metabolic Panel: Recent Labs  Lab 02/27/22 1811 02/28/22 0132 02/28/22 0256 03/01/22 0734 03/02/22 0506  NA 131*  --  133* 133* 134*  K 2.6*  --  4.0 3.5 3.4*  CL 94*  --  97* 98 99  CO2 24  --  23 28 28   GLUCOSE  338*  --  230* 247* 148*  BUN 16  --  19 21 14   CREATININE 0.89 1.15* 1.15* 0.80 0.77  CALCIUM 8.5*  --  8.2* 7.6* 7.8*  MG 1.7  --   --   --  1.8  PHOS  --   --   --   --  2.2*   GFR Estimated Creatinine Clearance: 65.7 mL/min (by C-G formula based on SCr of 0.77 mg/dL). Liver Function Tests: Recent Labs  Lab 03/01/22 0734  AST 23  ALT 15  ALKPHOS 57  BILITOT 1.7*  PROT 5.9*  ALBUMIN 2.5*   No results for input(s): "LIPASE", "AMYLASE" in the last 168 hours. No results for input(s): "AMMONIA" in the last 168 hours. Coagulation profile Recent Labs  Lab 02/28/22 0734  INR 1.2    CBC: Recent Labs  Lab 02/27/22 1811 02/28/22 0132 02/28/22 0733 03/02/22 0506  WBC 5.8 9.6 6.8 5.5  NEUTROABS  --   --   --  3.5  HGB 12.2 10.5* 10.0* 9.7*  HCT 37.5 32.7* 31.3* 31.1*  MCV 87.4 87.9 90.5 90.9  PLT 146* 139* 125* 125*   Cardiac Enzymes: No results for input(s): "CKTOTAL", "CKMB", "CKMBINDEX", "TROPONINI" in the last 168 hours. BNP (last 3 results) No results for input(s): "PROBNP" in the last 8760 hours. CBG: Recent Labs  Lab 03/01/22 1655 03/01/22 2216 03/02/22 0036 03/02/22 0818 03/02/22 1302  GLUCAP 151* 180* 164* 145* 150*   D-Dimer: No results for input(s): "DDIMER" in the last 72 hours. Hgb A1c: Recent Labs    02/28/22 0132 02/28/22 2315  HGBA1C 9.2* 9.0*   Lipid Profile: No results for input(s): "CHOL", "HDL", "LDLCALC", "TRIG", "CHOLHDL", "LDLDIRECT" in the last 72 hours. Thyroid function studies: No results for input(s): "TSH", "T4TOTAL", "T3FREE",  "THYROIDAB" in the last 72 hours.  Invalid input(s): "FREET3" Anemia work up: No results for input(s): "VITAMINB12", "FOLATE", "FERRITIN", "TIBC", "IRON", "RETICCTPCT" in the last 72 hours. Sepsis Labs: Recent Labs  Lab 02/27/22 1811 02/28/22 0031 02/28/22 0132 02/28/22 0347 02/28/22 0733 03/01/22 0734 03/02/22 0506  PROCALCITON  --   --  34.62  --   --   --   --   WBC 5.8  --  9.6  --  6.8  --  5.5  LATICACIDVEN  --  6.7*  --  2.6*  --  1.0  --     Microbiology Recent Results (from the past 240 hour(s))  Resp panel by RT-PCR (RSV, Flu A&B, Covid) Anterior Nasal Swab     Status: Abnormal   Collection Time: 02/27/22  9:08 PM   Specimen: Anterior Nasal Swab  Result Value Ref Range Status   SARS Coronavirus 2 by RT PCR POSITIVE (A) NEGATIVE Final    Comment: (NOTE) SARS-CoV-2 target nucleic acids are DETECTED.  The SARS-CoV-2 RNA is generally detectable in upper respiratory specimens during the acute phase of infection. Positive results are indicative of the presence of the identified virus, but do not rule out bacterial infection or co-infection with other pathogens not detected by the test. Clinical correlation with patient history and other diagnostic information is necessary to determine patient infection status. The expected result is Negative.  Fact Sheet for Patients: 03/01/22  Fact Sheet for Healthcare Providers: BloggerCourse.com  This test is not yet approved or cleared by the [employer] FDA and  has been authorized for detection and/or diagnosis of SARS-CoV-2 by FDA under an Emergency Use Authorization (EUA).  This EUA will remain in effect (meaning  this test can be used) for the duration of  the COVID-19 declaration under Section 564(b)(1) of the A ct, 21 U.S.C. section 360bbb-3(b)(1), unless the authorization is terminated or revoked sooner.     Influenza A by PCR NEGATIVE NEGATIVE Final    Influenza B by PCR NEGATIVE NEGATIVE Final    Comment: (NOTE) The Xpert Xpress SARS-CoV-2/FLU/RSV plus assay is intended as an aid in the diagnosis of influenza from Nasopharyngeal swab specimens and should not be used as a sole basis for treatment. Nasal washings and aspirates are unacceptable for Xpert Xpress SARS-CoV-2/FLU/RSV testing.  Fact Sheet for Patients: EntrepreneurPulse.com.au  Fact Sheet for Healthcare Providers: IncredibleEmployment.be  This test is not yet approved or cleared by the [place] FDA and has been authorized for detection and/or diagnosis of SARS-CoV-2 by FDA under an Emergency Use Authorization (EUA). This EUA will remain in effect (meaning this test can be used) for the duration of the COVID-19 declaration under Section 564(b)(1) of the Act, 21 U.S.C. section 360bbb-3(b)(1), unless the authorization is terminated or revoked.     Resp Syncytial Virus by PCR NEGATIVE NEGATIVE Final    Comment: (NOTE) Fact Sheet for Patients: EntrepreneurPulse.com.au  Fact Sheet for Healthcare Providers: IncredibleEmployment.be  This test is not yet approved or cleared by the [place] FDA and has been authorized for detection and/or diagnosis of SARS-CoV-2 by FDA under an Emergency Use Authorization (EUA). This EUA will remain in effect (meaning this test can be used) for the duration of the COVID-19 declaration under Section 564(b)(1) of the Act, 21 U.S.C. section 360bbb-3(b)(1), unless the authorization is terminated or revoked.  Performed at [facility], [address] [place], [place] [address]   Culture, blood (x 2)     Status: Abnormal   Collection Time: 02/28/22 12:31 AM   Specimen: BLOOD  Result Value Ref Range Status   Specimen Description   Final    BLOOD BLOOD LEFT WRIST Performed at [facility], [address] [place], [place] [address]     Special Requests   Final    BOTTLES DRAWN AEROBIC AND ANAEROBIC Blood Culture results may not be optimal due to an inadequate volume of blood received in culture bottles Performed at [facility], [address] [place], [place] [address]    Culture  Setup Time   Final    GRAM NEGATIVE RODS IN BOTH AEROBIC AND ANAEROBIC BOTTLES CRITICAL RESULT CALLED TO, READ BACK BY AND VERIFIED WITH: ALEX CHAPPELL AT 6237 02/28/22.PMF Performed at [facility], [place] [address] [place], [place] [address]    Culture ESCHERICHIA COLI (A)  Final   Report Status 03/02/2022 FINAL  Final  Blood Culture ID Panel (Reflexed)     Status: Abnormal   Collection Time: 02/28/22 12:31 AM  Result Value Ref Range Status   Enterococcus faecalis NOT DETECTED NOT DETECTED Final   Enterococcus Faecium NOT DETECTED NOT DETECTED Final   Listeria monocytogenes NOT DETECTED NOT DETECTED Final   Staphylococcus species NOT DETECTED NOT DETECTED Final   Staphylococcus aureus (BCID) NOT DETECTED NOT DETECTED Final   Staphylococcus epidermidis NOT DETECTED NOT DETECTED Final   Staphylococcus lugdunensis NOT DETECTED NOT DETECTED Final   Streptococcus species NOT DETECTED NOT DETECTED Final   Streptococcus agalactiae NOT DETECTED NOT DETECTED Final   Streptococcus pneumoniae NOT DETECTED NOT DETECTED Final   Streptococcus pyogenes NOT DETECTED NOT DETECTED Final   A.calcoaceticus-baumannii NOT DETECTED NOT DETECTED Final   Bacteroides fragilis NOT DETECTED NOT  DETECTED Final   Enterobacterales DETECTED (A) NOT DETECTED Final    Comment: Enterobacterales represent a large order of gram negative bacteria, not a single organism. CRITICAL RESULT CALLED TO, READ BACK BY AND VERIFIED WITH: ALEX CHAPPELL AT 4034 02/28/22.PMF    Enterobacter cloacae complex NOT DETECTED NOT DETECTED Final   Escherichia coli DETECTED (A) NOT DETECTED Final    Comment: CRITICAL RESULT CALLED TO, READ BACK BY AND VERIFIED WITH: ALEX  CHAPPELL AT 7425 02/28/22.PMF    Klebsiella aerogenes NOT DETECTED NOT DETECTED Final   Klebsiella oxytoca NOT DETECTED NOT DETECTED Final   Klebsiella pneumoniae NOT DETECTED NOT DETECTED Final   Proteus species NOT DETECTED NOT DETECTED Final   Salmonella species NOT DETECTED NOT DETECTED Final   Serratia marcescens NOT DETECTED NOT DETECTED Final   Haemophilus influenzae NOT DETECTED NOT DETECTED Final   Neisseria meningitidis NOT DETECTED NOT DETECTED Final   Pseudomonas aeruginosa NOT DETECTED NOT DETECTED Final   Stenotrophomonas maltophilia NOT DETECTED NOT DETECTED Final   Candida albicans NOT DETECTED NOT DETECTED Final   Candida auris NOT DETECTED NOT DETECTED Final   Candida glabrata NOT DETECTED NOT DETECTED Final   Candida krusei NOT DETECTED NOT DETECTED Final   Candida parapsilosis NOT DETECTED NOT DETECTED Final   Candida tropicalis NOT DETECTED NOT DETECTED Final   Cryptococcus neoformans/gattii NOT DETECTED NOT DETECTED Final   CTX-M ESBL NOT DETECTED NOT DETECTED Final   Carbapenem resistance IMP NOT DETECTED NOT DETECTED Final   Carbapenem resistance KPC NOT DETECTED NOT DETECTED Final   Carbapenem resistance NDM NOT DETECTED NOT DETECTED Final   Carbapenem resist OXA 48 LIKE NOT DETECTED NOT DETECTED Final   Carbapenem resistance VIM NOT DETECTED NOT DETECTED Final    Comment: Performed at [facility], [address] [place], [place] [address]  Culture, blood (x 2)     Status: Abnormal   Collection Time: 02/28/22  1:32 AM   Specimen: BLOOD  Result Value Ref Range Status   Specimen Description   Final    BLOOD BLOOD LEFT FOREARM Performed at [facility], [address], [place], [place] [address]    Special Requests   Final    BOTTLES DRAWN AEROBIC AND ANAEROBIC Blood Culture adequate volume Performed at [facility], [address] [place], [place] [address]    Culture  Setup Time   Final    GRAM NEGATIVE RODS IN BOTH  AEROBIC AND ANAEROBIC BOTTLES CRITICAL RESULT CALLED TO, READ BACK BY AND VERIFIED WITH: ALEX CHAPPELL AT 8841 02/28/22.PMF Performed at [facility], [address] [place], [place] [address]    Culture ESCHERICHIA COLI (A)  Final   Report Status 03/02/2022 FINAL  Final   Organism ID, Bacteria ESCHERICHIA COLI  Final      Susceptibility   Escherichia coli - MIC*    AMPICILLIN >=32 RESISTANT Resistant     CEFAZOLIN <=4 SENSITIVE Sensitive     CEFEPIME <=0.12 SENSITIVE Sensitive     CEFTAZIDIME <=1 SENSITIVE Sensitive     CEFTRIAXONE <=0.25 SENSITIVE Sensitive     CIPROFLOXACIN <=0.25 SENSITIVE Sensitive     GENTAMICIN <=1 SENSITIVE Sensitive     IMIPENEM <=0.25 SENSITIVE Sensitive     TRIMETH/SULFA <=20 SENSITIVE Sensitive     AMPICILLIN/SULBACTAM >=32 RESISTANT Resistant     PIP/TAZO <=4 SENSITIVE Sensitive     * ESCHERICHIA COLI    Procedures and diagnostic studies:  No results found.  LOS: 2 days   Eniola Cerullo  [facility]   Pager on [email or web address]. If 7PM-7AM, please contact night-coverage at [email or web address]     03/02/2022, 3:17 PM

## 2022-03-02 NOTE — Consult Note (Signed)
ANTICOAGULATION CONSULT NOTE - Initial Consult  Pharmacy Consult for heparin Indication: chest pain/ACS  Allergies  Allergen Reactions   Latex     Pt states that she is allergic to latex in pre-op.  Regardless of allergy type latex will not be used.    Bactrim [Sulfamethoxazole-Trimethoprim]     Nausea, no appitite   Penicillins     Tolerated Cephalosporin Date: 07/11/2021.      Patient Measurements: Height: 5\' 2"  (157.5 cm) Weight: 83.9 kg (184 lb 15.5 oz) IBW/kg (Calculated) : 50.1 Heparin Dosing Weight: 69  Vital Signs: Temp: 99 F (37.2 C) (01/22 0503) Temp Source: Oral (01/22 0503) BP: 129/66 (01/22 0503) Pulse Rate: 93 (01/22 0503)  Labs: Recent Labs    02/28/22 0132 02/28/22 0256 02/28/22 0733 02/28/22 0734 02/28/22 1454 03/01/22 0734 03/01/22 2008 03/02/22 0506  HGB 10.5*  --  10.0*  --   --   --   --  9.7*  HCT 32.7*  --  31.3*  --   --   --   --  31.1*  PLT 139*  --  125*  --   --   --   --  125*  APTT  --   --   --  27  --   --   --   --   LABPROT  --   --   --  15.1  --   --   --   --   INR  --   --   --  1.2  --   --   --   --   HEPARINUNFRC  --   --   --   --   --   --  0.16* <0.10*  CREATININE 1.15* 1.15*  --   --   --  0.80  --  0.77  TROPONINIHS  --  135*  --   --  130* 333*  --   --      Estimated Creatinine Clearance: 65.7 mL/min (by C-G formula based on SCr of 0.77 mg/dL).   Medical History: Past Medical History:  Diagnosis Date   BMI 35.0-35.9,adult 07/17/2020   Closed fracture of right distal humerus 07/12/2021   HLD (hyperlipidemia)    Hypertension    Pre-diabetes    Vitamin D deficiency 07/13/2021    Medications:  Enoxaparin >> Heparin  Assessment: 72 yo F with PMH DM, HTN presents with dizziness and AMS and a 1-week history of cough. On arrival, O2Sat was 87%. ECG showed sinus tachycardia and cTn has been trending up, from 29 >> 130 >> 333. Pharmacy has been consulted to initiate heparin for 48 hrs in the setting of chest  pain/ACS. Patient has been taking prophylactic enoxaparin in the ED. No chronic anticoagulation PTA noted from chart review. Hgb and PLT has trended down mildly since admission.  Heparin is intended to run for 48 hrs.  Goal of Therapy:  Heparin level 0.3-0.7 units/ml Monitor platelets by anticoagulation protocol: Yes  Baseline Labs: aPTT - 27; INR - 1.2 Hgb - 10.0; Plts - 125  Date Time aPTT/HL Rate/Comment 1/21     2008     0.16                SUBtherapeutic @ 850 units/hr 1/22     0506     < 0.10             SUBtherapeutic @ 1100 un/hr    Plan:  1/22:  HL @ 0506 = <  0.10, SUBtherapeutic  - Will order heparin 2050 units IV X 1 bolus and increase drip rate to 1350 units/hr. -  Will recheck HL 6 hrs after rate change.  Continue to monitor H&H and platelets daily while on heparin gtt.  Jaden Abreu D 03/02/2022 6:28 AM

## 2022-03-03 DIAGNOSIS — E876 Hypokalemia: Secondary | ICD-10-CM

## 2022-03-03 DIAGNOSIS — I214 Non-ST elevation (NSTEMI) myocardial infarction: Secondary | ICD-10-CM | POA: Diagnosis not present

## 2022-03-03 DIAGNOSIS — R7989 Other specified abnormal findings of blood chemistry: Secondary | ICD-10-CM | POA: Diagnosis not present

## 2022-03-03 DIAGNOSIS — R6521 Severe sepsis with septic shock: Secondary | ICD-10-CM | POA: Diagnosis not present

## 2022-03-03 DIAGNOSIS — A419 Sepsis, unspecified organism: Secondary | ICD-10-CM | POA: Diagnosis not present

## 2022-03-03 DIAGNOSIS — I5021 Acute systolic (congestive) heart failure: Secondary | ICD-10-CM | POA: Diagnosis not present

## 2022-03-03 DIAGNOSIS — R7881 Bacteremia: Secondary | ICD-10-CM | POA: Diagnosis not present

## 2022-03-03 LAB — BASIC METABOLIC PANEL
Anion gap: 8 (ref 5–15)
BUN: 11 mg/dL (ref 8–23)
CO2: 26 mmol/L (ref 22–32)
Calcium: 8.1 mg/dL — ABNORMAL LOW (ref 8.9–10.3)
Chloride: 98 mmol/L (ref 98–111)
Creatinine, Ser: 0.67 mg/dL (ref 0.44–1.00)
GFR, Estimated: >60 mL/min (ref >60)
Glucose, Bld: 147 mg/dL — ABNORMAL HIGH (ref 70–99)
Potassium: 3.3 mmol/L — ABNORMAL LOW (ref 3.5–5.1)
Sodium: 132 mmol/L — ABNORMAL LOW (ref 135–145)

## 2022-03-03 LAB — CBC WITH DIFFERENTIAL/PLATELET
Abs Immature Granulocytes: 0.03 10*3/uL (ref 0.00–0.07)
Basophils Absolute: 0 10*3/uL (ref 0.0–0.1)
Basophils Relative: 0 %
Eosinophils Absolute: 0 10*3/uL (ref 0.0–0.5)
Eosinophils Relative: 0 %
HCT: 31.5 % — ABNORMAL LOW (ref 36.0–46.0)
Hemoglobin: 10.1 g/dL — ABNORMAL LOW (ref 12.0–15.0)
Immature Granulocytes: 1 %
Lymphocytes Relative: 27 %
Lymphs Abs: 1 10*3/uL (ref 0.7–4.0)
MCH: 28.6 pg (ref 26.0–34.0)
MCHC: 32.1 g/dL (ref 30.0–36.0)
MCV: 89.2 fL (ref 80.0–100.0)
Monocytes Absolute: 0.5 10*3/uL (ref 0.1–1.0)
Monocytes Relative: 13 %
Neutro Abs: 2.3 10*3/uL (ref 1.7–7.7)
Neutrophils Relative %: 59 %
Platelets: 105 10*3/uL — ABNORMAL LOW (ref 150–400)
RBC: 3.53 MIL/uL — ABNORMAL LOW (ref 3.87–5.11)
RDW: 17.2 % — ABNORMAL HIGH (ref 11.5–15.5)
WBC: 3.8 10*3/uL — ABNORMAL LOW (ref 4.0–10.5)
nRBC: 0 % (ref 0.0–0.2)

## 2022-03-03 LAB — HEPARIN LEVEL (UNFRACTIONATED)
Heparin Unfractionated: <0.1 IU/mL — ABNORMAL LOW (ref 0.30–0.70)
Heparin Unfractionated: 0.21 IU/mL — ABNORMAL LOW (ref 0.30–0.70)
Heparin Unfractionated: 0.16 IU/mL — ABNORMAL LOW (ref 0.30–0.70)
Heparin Unfractionated: 0.1 IU/mL — ABNORMAL LOW (ref 0.30–0.70)

## 2022-03-03 LAB — GLUCOSE, CAPILLARY
Glucose-Capillary: 109 mg/dL — ABNORMAL HIGH (ref 70–99)
Glucose-Capillary: 130 mg/dL — ABNORMAL HIGH (ref 70–99)
Glucose-Capillary: 133 mg/dL — ABNORMAL HIGH (ref 70–99)
Glucose-Capillary: 134 mg/dL — ABNORMAL HIGH (ref 70–99)
Glucose-Capillary: 134 mg/dL — ABNORMAL HIGH (ref 70–99)

## 2022-03-03 LAB — MAGNESIUM: Magnesium: 1.8 mg/dL (ref 1.7–2.4)

## 2022-03-03 MED ORDER — MAGNESIUM SULFATE 2 GM/50ML IV SOLN
2.0000 g | Freq: Once | INTRAVENOUS | Status: AC
  Administered 2022-03-03: 2 g via INTRAVENOUS
  Filled 2022-03-03: qty 50

## 2022-03-03 MED ORDER — HEPARIN BOLUS VIA INFUSION
1000.0000 [IU] | Freq: Once | INTRAVENOUS | Status: AC
  Administered 2022-03-03: 1000 [IU] via INTRAVENOUS
  Filled 2022-03-03: qty 1000

## 2022-03-03 MED ORDER — SODIUM CHLORIDE 0.9 % IV SOLN: INTRAVENOUS | Status: DC

## 2022-03-03 MED ORDER — SODIUM CHLORIDE 0.9% FLUSH: 3.0000 mL | INTRAVENOUS | Status: DC | PRN

## 2022-03-03 MED ORDER — HEPARIN BOLUS VIA INFUSION
2000.0000 [IU] | Freq: Once | INTRAVENOUS | Status: AC
  Administered 2022-03-03: 2000 [IU] via INTRAVENOUS
  Filled 2022-03-03: qty 2000

## 2022-03-03 MED ORDER — SODIUM CHLORIDE 0.9% FLUSH
3.0000 mL | Freq: Two times a day (BID) | INTRAVENOUS | Status: DC
  Administered 2022-03-03 (×2): 3 mL via INTRAVENOUS

## 2022-03-03 MED ORDER — POTASSIUM CHLORIDE CRYS ER 20 MEQ PO TBCR
40.0000 meq | EXTENDED_RELEASE_TABLET | Freq: Once | ORAL | Status: AC
  Administered 2022-03-03: 40 meq via ORAL
  Filled 2022-03-03: qty 2

## 2022-03-03 MED ORDER — CEFAZOLIN SODIUM-DEXTROSE 2-4 GM/100ML-% IV SOLN
2.0000 g | Freq: Three times a day (TID) | INTRAVENOUS | Status: DC
  Administered 2022-03-04 – 2022-03-05 (×3): 2 g via INTRAVENOUS
  Filled 2022-03-03 (×5): qty 100

## 2022-03-03 MED ORDER — ASPIRIN 81 MG PO CHEW: 81.0000 mg | CHEWABLE_TABLET | ORAL | Status: DC

## 2022-03-03 MED ORDER — SODIUM CHLORIDE 0.9 % IV SOLN: 250.0000 mL | INTRAVENOUS | Status: DC | PRN

## 2022-03-03 NOTE — Progress Notes (Signed)
Progress Note  Patient Name: Erika Perry Date of Encounter: 03/03/2022  Primary Cardiologist: new - consult by Rockey Situ   Subjective   Still with mild shortness of breath.  Son is at the bedside.  Prolonged discussion about the risks and benefits of right and left cardiac catheterization.  Inpatient Medications    Scheduled Meds:  [START ON 03/04/2022] aspirin  81 mg Oral Pre-Cath   insulin aspart  0-15 Units Subcutaneous TID WC   insulin aspart  0-5 Units Subcutaneous QHS   potassium chloride  40 mEq Oral Daily   rosuvastatin  20 mg Oral Daily   sodium chloride flush  3 mL Intravenous Q12H   sodium chloride flush  3 mL Intravenous Q12H   Continuous Infusions:  sodium chloride Stopped (02/28/22 1430)   sodium chloride     [START ON 03/04/2022] sodium chloride     [START ON 03/04/2022]  ceFAZolin (ANCEF) IV     heparin 2,200 Units/hr (03/03/22 1334)   PRN Meds: sodium chloride, acetaminophen **OR** acetaminophen, albuterol, HYDROcodone-acetaminophen, ondansetron (ZOFRAN) IV, sodium chloride flush   Vital Signs    Vitals:   03/03/22 1100 03/03/22 1102 03/03/22 1200 03/03/22 1300  BP:  131/73    Pulse:  90    Resp: (!) 23 18 (!) 21 18  Temp:  98.5 F (36.9 C)    TempSrc:  Oral    SpO2:  97%    Weight:      Height:        Intake/Output Summary (Last 24 hours) at 03/03/2022 1348 Last data filed at 03/03/2022 1100 Gross per 24 hour  Intake 1156.55 ml  Output --  Net 1156.55 ml    Filed Weights   02/28/22 0155 03/01/22 1300  Weight: 83.9 kg 83.9 kg    Telemetry    SR, 90s bpm - Personally Reviewed  ECG    No new tracings - Personally Reviewed  Physical Exam   GEN: No acute distress.   Neck: Mild JVD. Cardiac: RRR, no murmurs, rubs, or gallops.  Respiratory: Diminished and coarse breath sounds bilaterally.  GI: Soft, nontender, non-distended.   MS:  No deformity.  Trace bilateral leg edema. Neuro:  Alert and oriented x 3; Nonfocal.  Psych: Normal  affect.  Labs    Chemistry Recent Labs  Lab 03/01/22 0734 03/02/22 0506 03/03/22 0349  NA 133* 134* 132*  K 3.5 3.4* 3.3*  CL 98 99 98  CO2 28 28 26   GLUCOSE 247* 148* 147*  BUN 21 14 11   CREATININE 0.80 0.77 0.67  CALCIUM 7.6* 7.8* 8.1*  PROT 5.9*  --   --   ALBUMIN 2.5*  --   --   AST 23  --   --   ALT 15  --   --   ALKPHOS 57  --   --   BILITOT 1.7*  --   --   GFRNONAA >60 >60 >60  ANIONGAP 7 7 8       Hematology Recent Labs  Lab 02/28/22 0733 03/02/22 0506 03/03/22 0349  WBC 6.8 5.5 3.8*  RBC 3.46* 3.42* 3.53*  HGB 10.0* 9.7* 10.1*  HCT 31.3* 31.1* 31.5*  MCV 90.5 90.9 89.2  MCH 28.9 28.4 28.6  MCHC 31.9 31.2 32.1  RDW 18.2* 17.8* 17.2*  PLT 125* 125* 105*     Cardiac EnzymesNo results for input(s): "TROPONINI" in the last 168 hours. No results for input(s): "TROPIPOC" in the last 168 hours.   BNPNo results for input(s): "  BNP", "PROBNP" in the last 168 hours.   DDimer No results for input(s): "DDIMER" in the last 168 hours.   Radiology    CT Angio Chest Pulmonary Embolism (PE) W or WO Contrast  Result Date: 02/28/2022 IMPRESSION: 1. No pulmonary embolism. 2. Moderate coronary artery calcification within the left anterior descending coronary artery. 3. Scattered bilateral ground-glass opacity likely relates to atelectasis due to expiratory phase imaging. No definite superimposed focal pulmonary nodule or infiltrate. Aortic Atherosclerosis (ICD10-I70.0). Electronically Signed   By: Fidela Salisbury M.D.   On: 02/28/2022 02:13   DG Chest Portable 1 View  Result Date: 02/27/2022 IMPRESSION: 1. No acute cardiopulmonary process. 2. Faint focal area of increased density in the right suprahilar region. Follow-up with chest radiograph or CT on a nonemergent/outpatient basis recommended. Electronically Signed   By: Anner Crete M.D.   On: 02/27/2022 21:53    Cardiac Studies   2D echo 02/28/2022: 1. Left ventricular ejection fraction, by estimation, is 35 to  40%. Left  ventricular ejection fraction by 2D MOD biplane is 41.3 %. The left  ventricle has moderately decreased function. The left ventricle  demonstrates global hypokinesis. Left  ventricular diastolic parameters are consistent with Grade I diastolic  dysfunction (impaired relaxation).   2. Right ventricular systolic function is normal. The right ventricular  size is normal. There is normal pulmonary artery systolic pressure. The  estimated right ventricular systolic pressure is 50.9 mmHg.   3. The mitral valve is normal in structure. Mild mitral valve  regurgitation. No evidence of mitral stenosis.   4. Tricuspid valve regurgitation is mild to moderate.   5. The aortic valve is tricuspid. Aortic valve regurgitation is not  visualized. No aortic stenosis is present.   6. The inferior vena cava is normal in size with greater than 50%  respiratory variability, suggesting right atrial pressure of 3 mmHg.   Patient Profile     72 y.o. female with history of uncontrolled DM, HTN, and HLD who was admitted with severe sepsis secondary to Covid infection and UTI and is being seen today for the evaluation of mildly elevated high sensitivity troponin at the request of Dr. Damita Dunnings, found to have acute HFrEF.  Assessment & Plan    1.  Acute systolic heart failure: EF of 35 to 40%.  High risk for underlying ischemic heart disease considering her risk factors and uncontrolled diabetes. The patient is scheduled for a right and left cardiac catheterization and possible PCI to be done today.  I discussed the procedure in details as well as risk and benefits.  Further recommendations to follow after cardiac cath. Will try to introduce small dose heart failure medications after cardiac catheterization.  2.  Elevated troponin with a peak of 333: Not able to determine at this point if this is due to acute coronary syndrome or supply demand ischemia.  She is currently on heparin drip.  Left heart  catheterization will help differentiate the etiology of this.  CT scan of the chest did show evidence of coronary artery calcifications especially in the LAD distribution.   3.  Septic shock due to E. coli bacteremia and UTI.  Significant improvement with antibiotics and IV fluids.   4. Poorly controlled diabetes: -Last A1c 11.1 -Management per primary service   For questions or updates, please contact [place] Please consult [email or web address] for contact info under Cardiology/STEMI.    Signed, Kathlyn Sacramento, MD [facility] 03/03/2022, 1:48 PM

## 2022-03-03 NOTE — Consult Note (Signed)
[place] for heparin Indication: chest pain/ACS  Allergies  Allergen Reactions   Latex     Pt states that she is allergic to latex in pre-op.  Regardless of allergy type latex will not be used.    Bactrim [Sulfamethoxazole-Trimethoprim]     Nausea, no appitite   Penicillins     Tolerated Cephalosporin Date: 07/11/2021.      Patient Measurements: Height: 5\' 2"  (157.5 cm) Weight: 83.9 kg (184 lb 15.5 oz) IBW/kg (Calculated) : 50.1 Heparin Dosing Weight: 69  Vital Signs: Temp: 98 F (36.7 C) (01/22 2039) Temp Source: Oral (01/22 1351) BP: 110/59 (01/22 2039) Pulse Rate: 99 (01/22 2039)  Labs: Recent Labs    02/28/22 0132 02/28/22 0256 02/28/22 0733 02/28/22 0734 02/28/22 1454 03/01/22 0734 03/01/22 2008 03/02/22 0506 03/02/22 1345 03/02/22 2146  HGB 10.5*  --  10.0*  --   --   --   --  9.7*  --   --   HCT 32.7*  --  31.3*  --   --   --   --  31.1*  --   --   PLT 139*  --  125*  --   --   --   --  125*  --   --   APTT  --   --   --  27  --   --   --   --   --   --   LABPROT  --   --   --  15.1  --   --   --   --   --   --   INR  --   --   --  1.2  --   --   --   --   --   --   HEPARINUNFRC  --   --   --   --   --   --    < > <0.10* 0.10* <0.10*  CREATININE 1.15* 1.15*  --   --   --  0.80  --  0.77  --   --   TROPONINIHS  --  135*  --   --  130* 333*  --   --   --   --    < > = values in this interval not displayed.     Estimated Creatinine Clearance: 65.7 mL/min (by C-G formula based on SCr of 0.77 mg/dL).   Medical History: Past Medical History:  Diagnosis Date   BMI 35.0-35.9,adult 07/17/2020   Closed fracture of right distal humerus 07/12/2021   HLD (hyperlipidemia)    Hypertension    Pre-diabetes    Vitamin D deficiency 07/13/2021    Medications:  Enoxaparin >> Heparin  Assessment: 72 yo F with PMH DM, HTN presents with dizziness and AMS and a 1-week history of cough. On arrival, O2Sat was 87%. ECG showed sinus  tachycardia and cTn has been trending up, from 29 >> 130 >> 333. Pharmacy has been consulted to initiate heparin for 48 hrs in the setting of chest pain/ACS. Patient has been taking prophylactic enoxaparin in the ED. No chronic anticoagulation PTA noted from chart review. Hgb and PLT has trended down mildly since admission.  Heparin is intended to run for 48 hrs.  Goal of Therapy:  Heparin level 0.3-0.7 units/ml Monitor platelets by anticoagulation protocol: Yes  Baseline Labs: aPTT - 27; INR - 1.2 Hgb - 10.0; Plts - 125  Date Time aPTT/HL Rate/Comment 1/21  2008     0.16                SUBtherapeutic @ 850 units/hr 1/22     0506     < 0.10             SUBtherapeutic @ 1100 un/hr  1/22 1345   0.10  Subtherapeutic @ 1350 units/hr  1/22 2146 <0.1  Subtherapeutic @ 1600 units/hr   Plan: HL subtherapeutic -Order heparin 2000 units IV x 1 bolus -Increase heparin drip rate to 1850 units/hr -Check heparin level 6 hours after rate change -Daily CBC while on heparin  Renda Rolls, PharmD, [facility] 03/03/2022 12:04 AM

## 2022-03-03 NOTE — Progress Notes (Signed)
Progress Note    Erika Perry  KXF:[id number] DOB: 27-Jul-1950  DOA: 02/27/2022 PCP: Leone Haven, MD      Brief Narrative:    Medical records reviewed and are as summarized below:  Erika Perry is a 72 y.o. female with medical history significant for hypertension, type II DM, obesity, vitamin D deficiency, who presented to the hospital because of confusion and dizziness from standing.  She also reported having chills shortly before admission.  She had been coughing for over a week.  Oxygen saturation was 87% on room air when EMS arrived.   She was hypotensive on admission.  Lactic acid was 6.7, procalcitonin was 34.62, sodium was 131, potassium 2.6, urinalysis was suggestive of UTI and COVID test was positive. She was admitted to the hospital for severe sepsis secondary to acute UTI.  She was treated with IV fluids and empiric IV antibiotics.  Hypotension worsened despite treatment with IV fluids.  She was started on IV Levophed infusion for septic shock.  Intensivist was consulted to assist with management. She required 2 L/min oxygen for acute hypoxic respiratory failure from severe sepsis.  Cardiologist was consulted for elevated troponins.    Assessment/Plan:   Principal Problem:   Septic shock ([place]) Active Problems:   COVID-19   E coli bacteremia   Urinary tract infection without hematuria   Postural dizziness with presyncope   NSTEMI (non-ST elevated myocardial infarction) ([place])   Hypokalemia   Uncontrolled type 2 diabetes mellitus with hyperglycemia, without long-term current use of insulin (HCC)   Hypertension   Elevated troponin   Demand ischemia   Dilated cardiomyopathy (HCC)   Acute systolic CHF (congestive heart failure) (HCC)    Body mass index is 33.83 kg/m.  (Obesity)  Septic shock secondary to E. coli bacteremia and UTI:  Change IV ceftriaxone to IV Ancef. Lactic acid was 6.7 on admission.  Lactic acidosis has resolved.  Initially  required Levophed for septic shock.  Type II NSTEMI, demand ischemia: Probably due to demand ischemia but unable to rule out ACS completely.  Continue IV heparin drip and monitor heparin level per protocol.  Plan for left heart cath today.  Acute systolic CHF: Hypotension limits the use of goal-directed medical therapy.  2D echo showed EF estimated at 35 to 69%, grade 1 diastolic dysfunction mild TR, mild MR, mild to moderate TR.   Acute hypoxic respiratory failure: Improved.  She is tolerating room air.  Use oxygen as needed.  Hypokalemia: Continue potassium repletion.  Give IV magnesium sulfate for low normal magnesium level.   Worsening thrombocytopenia: Platelet count is still trending down.  Monitor closely.   Postural dizziness: Resolved   COVID-19 infection: Cough started over a week prior to admission so patient was not treated for COVID.   Type II DM with hyperglycemia: Continue NovoLog as needed for hyperglycemia.  Hemoglobin A1c was 9.  Of note, hemoglobin A1c was 11.1 on 12/23/2021.   Hyperglycemia induced hyponatremia: Monitor BMP    Diet Order             Diet NPO time specified  Diet effective now                            Consultants: Intensivist Cardiologist  Procedures: Plan for left heart cath today    Medications:    [START ON 03/04/2022] aspirin  81 mg Oral Pre-Cath   insulin aspart  0-15 Units  Subcutaneous TID WC   insulin aspart  0-5 Units Subcutaneous QHS   potassium chloride  40 mEq Oral Daily   rosuvastatin  20 mg Oral Daily   sodium chloride flush  3 mL Intravenous Q12H   sodium chloride flush  3 mL Intravenous Q12H   Continuous Infusions:  sodium chloride Stopped (02/28/22 1430)   sodium chloride     [START ON 03/04/2022] sodium chloride     [START ON 03/04/2022]  ceFAZolin (ANCEF) IV     heparin 2,000 Units/hr (03/03/22 1100)     Anti-infectives (From admission, onward)    Start     Dose/Rate Route Frequency  Ordered Stop   03/04/22 0600  ceFAZolin (ANCEF) IVPB 2g/100 mL premix        2 g 200 mL/hr over 30 Minutes Intravenous Every 8 hours 03/03/22 0949     03/01/22 1000  cefTRIAXone (ROCEPHIN) 2 g in sodium chloride 0.9 % 100 mL IVPB        2 g 200 mL/hr over 30 Minutes Intravenous Every 24 hours 02/28/22 1302 03/03/22 1027   02/28/22 1315  cefTRIAXone (ROCEPHIN) 1 g in sodium chloride 0.9 % 100 mL IVPB        1 g 200 mL/hr over 30 Minutes Intravenous  Once 02/28/22 1302 02/28/22 1428   02/28/22 0230  cefTRIAXone (ROCEPHIN) 1 g in sodium chloride 0.9 % 100 mL IVPB  Status:  Discontinued        1 g 200 mL/hr over 30 Minutes Intravenous Every 24 hours 02/28/22 0226 02/28/22 1302   02/28/22 0230  azithromycin (ZITHROMAX) 500 mg in sodium chloride 0.9 % 250 mL IVPB  Status:  Discontinued        500 mg 250 mL/hr over 60 Minutes Intravenous Every 24 hours 02/28/22 0226 03/01/22 1222              Family Communication/Anticipated D/C date and plan/Code Status   DVT prophylaxis:      Code Status: Full Code  Family Communication: None Disposition Plan: Plan to discharge home in 1 to 2 days   Status is: Inpatient Remains inpatient appropriate because: Septic shock on IV antibiotics, plan for left heart cath       Subjective:   No chest pain, shortness of breath, nausea or vomiting.  Objective:    Vitals:   03/03/22 0900 03/03/22 1000 03/03/22 1100 03/03/22 1102  BP:    131/73  Pulse:    90  Resp: (!) 23 18 (!) 23 18  Temp:    98.5 F (36.9 C)  TempSrc:    Oral  SpO2:    97%  Weight:      Height:       No data found.   Intake/Output Summary (Last 24 hours) at 03/03/2022 1321 Last data filed at 03/03/2022 1100 Gross per 24 hour  Intake 1156.55 ml  Output --  Net 1156.55 ml    Filed Weights   02/28/22 0155 03/01/22 1300  Weight: 83.9 kg 83.9 kg    Exam:   GEN: NAD SKIN: Warm and dry EYES: EOMI ENT: MMM CV: RRR PULM: No wheezing or rales ABD: soft,  ND, NT, +BS CNS: AAO x 3, non focal EXT: No edema or tenderness      Data Reviewed:   I have personally reviewed following labs and imaging studies:  Labs: Labs show the following:   Basic Metabolic Panel: Recent Labs  Lab 02/27/22 1811 02/28/22 0132 02/28/22 0256 03/01/22 0734 03/02/22 [id number]  03/03/22 0349  NA 131*  --  133* 133* 134* 132*  K 2.6*  --  4.0 3.5 3.4* 3.3*  CL 94*  --  97* 98 99 98  CO2 24  --  23 28 28 26   GLUCOSE 338*  --  230* 247* 148* 147*  BUN 16  --  19 21 14 11   CREATININE 0.89 1.15* 1.15* 0.80 0.77 0.67  CALCIUM 8.5*  --  8.2* 7.6* 7.8* 8.1*  MG 1.7  --   --   --  1.8 1.8  PHOS  --   --   --   --  2.2*  --    GFR Estimated Creatinine Clearance: 65.7 mL/min (by C-G formula based on SCr of 0.67 mg/dL). Liver Function Tests: Recent Labs  Lab 03/01/22 0734  AST 23  ALT 15  ALKPHOS 57  BILITOT 1.7*  PROT 5.9*  ALBUMIN 2.5*   No results for input(s): "LIPASE", "AMYLASE" in the last 168 hours. No results for input(s): "AMMONIA" in the last 168 hours. Coagulation profile Recent Labs  Lab 02/28/22 0734  INR 1.2    CBC: Recent Labs  Lab 02/27/22 1811 02/28/22 0132 02/28/22 0733 03/02/22 0506 03/03/22 0349  WBC 5.8 9.6 6.8 5.5 3.8*  NEUTROABS  --   --   --  3.5 2.3  HGB 12.2 10.5* 10.0* 9.7* 10.1*  HCT 37.5 32.7* 31.3* 31.1* 31.5*  MCV 87.4 87.9 90.5 90.9 89.2  PLT 146* 139* 125* 125* 105*   Cardiac Enzymes: No results for input(s): "CKTOTAL", "CKMB", "CKMBINDEX", "TROPONINI" in the last 168 hours. BNP (last 3 results) No results for input(s): "PROBNP" in the last 8760 hours. CBG: Recent Labs  Lab 03/02/22 1659 03/02/22 2042 03/03/22 0009 03/03/22 0746 03/03/22 1055  GLUCAP 168* 155* 134* 133* 130*   D-Dimer: No results for input(s): "DDIMER" in the last 72 hours. Hgb A1c: Recent Labs    02/28/22 2315  HGBA1C 9.0*   Lipid Profile: No results for input(s): "CHOL", "HDL", "LDLCALC", "TRIG", "CHOLHDL", "LDLDIRECT"  in the last 72 hours. Thyroid function studies: No results for input(s): "TSH", "T4TOTAL", "T3FREE", "THYROIDAB" in the last 72 hours.  Invalid input(s): "FREET3" Anemia work up: No results for input(s): "VITAMINB12", "FOLATE", "FERRITIN", "TIBC", "IRON", "RETICCTPCT" in the last 72 hours. Sepsis Labs: Recent Labs  Lab 02/28/22 0031 02/28/22 0132 02/28/22 0347 02/28/22 0733 03/01/22 0734 03/02/22 0506 03/03/22 0349  PROCALCITON  --  34.62  --   --   --   --   --   WBC  --  9.6  --  6.8  --  5.5 3.8*  LATICACIDVEN 6.7*  --  2.6*  --  1.0  --   --     Microbiology Recent Results (from the past 240 hour(s))  Resp panel by RT-PCR (RSV, Flu A&B, Covid) Anterior Nasal Swab     Status: Abnormal   Collection Time: 02/27/22  9:08 PM   Specimen: Anterior Nasal Swab  Result Value Ref Range Status   SARS Coronavirus 2 by RT PCR POSITIVE (A) NEGATIVE Final    Comment: (NOTE) SARS-CoV-2 target nucleic acids are DETECTED.  The SARS-CoV-2 RNA is generally detectable in upper respiratory specimens during the acute phase of infection. Positive results are indicative of the presence of the identified virus, but do not rule out bacterial infection or co-infection with other pathogens not detected by the test. Clinical correlation with patient history and other diagnostic information is necessary to determine patient infection status. The expected  result is Negative.  Fact Sheet for Patients: BloggerCourse.com  Fact Sheet for Healthcare Providers: SeriousBroker.it  This test is not yet approved or cleared by the [place] FDA and  has been authorized for detection and/or diagnosis of SARS-CoV-2 by FDA under an Emergency Use Authorization (EUA).  This EUA will remain in effect (meaning this test can be used) for the duration of  the COVID-19 declaration under Section 564(b)(1) of the A ct, 21 U.S.C. section 360bbb-3(b)(1), unless the  authorization is terminated or revoked sooner.     Influenza A by PCR NEGATIVE NEGATIVE Final   Influenza B by PCR NEGATIVE NEGATIVE Final    Comment: (NOTE) The Xpert Xpress SARS-CoV-2/FLU/RSV plus assay is intended as an aid in the diagnosis of influenza from Nasopharyngeal swab specimens and should not be used as a sole basis for treatment. Nasal washings and aspirates are unacceptable for Xpert Xpress SARS-CoV-2/FLU/RSV testing.  Fact Sheet for Patients: BloggerCourse.com  Fact Sheet for Healthcare Providers: SeriousBroker.it  This test is not yet approved or cleared by the [place] FDA and has been authorized for detection and/or diagnosis of SARS-CoV-2 by FDA under an Emergency Use Authorization (EUA). This EUA will remain in effect (meaning this test can be used) for the duration of the COVID-19 declaration under Section 564(b)(1) of the Act, 21 U.S.C. section 360bbb-3(b)(1), unless the authorization is terminated or revoked.     Resp Syncytial Virus by PCR NEGATIVE NEGATIVE Final    Comment: (NOTE) Fact Sheet for Patients: BloggerCourse.com  Fact Sheet for Healthcare Providers: SeriousBroker.it  This test is not yet approved or cleared by the [place] FDA and has been authorized for detection and/or diagnosis of SARS-CoV-2 by FDA under an Emergency Use Authorization (EUA). This EUA will remain in effect (meaning this test can be used) for the duration of the COVID-19 declaration under Section 564(b)(1) of the Act, 21 U.S.C. section 360bbb-3(b)(1), unless the authorization is terminated or revoked.  Performed at [facility], [address] [place], [place] [address]   Culture, blood (x 2)     Status: Abnormal   Collection Time: 02/28/22 12:31 AM   Specimen: BLOOD  Result Value Ref Range Status   Specimen Description   Final    BLOOD BLOOD  LEFT WRIST Performed at [facility], [address] [place], [place] [address]    Special Requests   Final    BOTTLES DRAWN AEROBIC AND ANAEROBIC Blood Culture results may not be optimal due to an inadequate volume of blood received in culture bottles Performed at [facility], [address], [place], [place] [address]    Culture  Setup Time   Final    GRAM NEGATIVE RODS IN BOTH AEROBIC AND ANAEROBIC BOTTLES CRITICAL RESULT CALLED TO, READ BACK BY AND VERIFIED WITH: ALEX CHAPPELL AT 1259 02/28/22.PMF Performed at [facility], [address] [place], [place] [address]    Culture ESCHERICHIA COLI (A)  Final   Report Status 03/02/2022 FINAL  Final  Blood Culture ID Panel (Reflexed)     Status: Abnormal   Collection Time: 02/28/22 12:31 AM  Result Value Ref Range Status   Enterococcus faecalis NOT DETECTED NOT DETECTED Final   Enterococcus Faecium NOT DETECTED NOT DETECTED Final   Listeria monocytogenes NOT DETECTED NOT DETECTED Final   Staphylococcus species NOT DETECTED NOT DETECTED Final   Staphylococcus aureus (BCID) NOT DETECTED NOT DETECTED Final   Staphylococcus epidermidis NOT DETECTED NOT DETECTED Final   Staphylococcus  lugdunensis NOT DETECTED NOT DETECTED Final   Streptococcus species NOT DETECTED NOT DETECTED Final   Streptococcus agalactiae NOT DETECTED NOT DETECTED Final   Streptococcus pneumoniae NOT DETECTED NOT DETECTED Final   Streptococcus pyogenes NOT DETECTED NOT DETECTED Final   A.calcoaceticus-baumannii NOT DETECTED NOT DETECTED Final   Bacteroides fragilis NOT DETECTED NOT DETECTED Final   Enterobacterales DETECTED (A) NOT DETECTED Final    Comment: Enterobacterales represent a large order of gram negative bacteria, not a single organism. CRITICAL RESULT CALLED TO, READ BACK BY AND VERIFIED WITH: ALEX CHAPPELL AT 1259 02/28/22.PMF    Enterobacter cloacae complex NOT DETECTED NOT DETECTED Final   Escherichia coli DETECTED (A) NOT  DETECTED Final    Comment: CRITICAL RESULT CALLED TO, READ BACK BY AND VERIFIED WITH: ALEX CHAPPELL AT 1259 02/28/22.PMF    Klebsiella aerogenes NOT DETECTED NOT DETECTED Final   Klebsiella oxytoca NOT DETECTED NOT DETECTED Final   Klebsiella pneumoniae NOT DETECTED NOT DETECTED Final   Proteus species NOT DETECTED NOT DETECTED Final   Salmonella species NOT DETECTED NOT DETECTED Final   Serratia marcescens NOT DETECTED NOT DETECTED Final   Haemophilus influenzae NOT DETECTED NOT DETECTED Final   Neisseria meningitidis NOT DETECTED NOT DETECTED Final   Pseudomonas aeruginosa NOT DETECTED NOT DETECTED Final   Stenotrophomonas maltophilia NOT DETECTED NOT DETECTED Final   Candida albicans NOT DETECTED NOT DETECTED Final   Candida auris NOT DETECTED NOT DETECTED Final   Candida glabrata NOT DETECTED NOT DETECTED Final   Candida krusei NOT DETECTED NOT DETECTED Final   Candida parapsilosis NOT DETECTED NOT DETECTED Final   Candida tropicalis NOT DETECTED NOT DETECTED Final   Cryptococcus neoformans/gattii NOT DETECTED NOT DETECTED Final   CTX-M ESBL NOT DETECTED NOT DETECTED Final   Carbapenem resistance IMP NOT DETECTED NOT DETECTED Final   Carbapenem resistance KPC NOT DETECTED NOT DETECTED Final   Carbapenem resistance NDM NOT DETECTED NOT DETECTED Final   Carbapenem resist OXA 48 LIKE NOT DETECTED NOT DETECTED Final   Carbapenem resistance VIM NOT DETECTED NOT DETECTED Final    Comment: Performed at [facility], [address] [place], [place] [address]  Culture, blood (x 2)     Status: Abnormal   Collection Time: 02/28/22  1:32 AM   Specimen: BLOOD  Result Value Ref Range Status   Specimen Description   Final    BLOOD BLOOD LEFT FOREARM Performed at [facility], [address] [place], [place] [address]    Special Requests   Final    BOTTLES DRAWN AEROBIC AND ANAEROBIC Blood Culture adequate volume Performed at [facility], [address] [place], [place] [address]    Culture  Setup Time   Final    GRAM NEGATIVE RODS IN BOTH AEROBIC AND ANAEROBIC BOTTLES CRITICAL RESULT CALLED TO, READ BACK BY AND VERIFIED WITH: ALEX CHAPPELL AT 1259 02/28/22.PMF Performed at [facility], [address] [place], [place] [address]    Culture ESCHERICHIA COLI (A)  Final   Report Status 03/02/2022 FINAL  Final   Organism ID, Bacteria ESCHERICHIA COLI  Final      Susceptibility   Escherichia coli - MIC*    AMPICILLIN >=32 RESISTANT Resistant     CEFAZOLIN <=4 SENSITIVE Sensitive     CEFEPIME <=0.12 SENSITIVE Sensitive     CEFTAZIDIME <=1 SENSITIVE Sensitive     CEFTRIAXONE <=0.25 SENSITIVE Sensitive     CIPROFLOXACIN <=0.25 SENSITIVE Sensitive     GENTAMICIN <=1 SENSITIVE Sensitive  IMIPENEM <=0.25 SENSITIVE Sensitive     TRIMETH/SULFA <=20 SENSITIVE Sensitive     AMPICILLIN/SULBACTAM >=32 RESISTANT Resistant     PIP/TAZO <=4 SENSITIVE Sensitive     * ESCHERICHIA COLI    Procedures and diagnostic studies:  No results found.             LOS: 3 days   Lashell Moffitt  [facility]   Pager on [email or web address]. If 7PM-7AM, please contact night-coverage at [email or web address]     03/03/2022, 1:21 PM

## 2022-03-03 NOTE — Consult Note (Signed)
[place] for heparin Indication: chest pain/ACS  Allergies  Allergen Reactions   Latex     Pt states that she is allergic to latex in pre-op.  Regardless of allergy type latex will not be used.    Bactrim [Sulfamethoxazole-Trimethoprim]     Nausea, no appitite   Penicillins     Tolerated Cephalosporin Date: 07/11/2021.      Patient Measurements: Height: 5\' 2"  (157.5 cm) Weight: 83.9 kg (184 lb 15.5 oz) IBW/kg (Calculated) : 50.1 Heparin Dosing Weight: 69  Vital Signs: Temp: 98.4 F (36.9 C) (01/23 1502) Temp Source: Oral (01/23 1502) BP: 129/77 (01/23 1502) Pulse Rate: 89 (01/23 1502)  Labs: Recent Labs    03/01/22 0734 03/01/22 2008 03/02/22 0506 03/02/22 1345 03/03/22 0349 03/03/22 1213 03/03/22 2142  HGB  --   --  9.7*  --  10.1*  --   --   HCT  --   --  31.1*  --  31.5*  --   --   PLT  --   --  125*  --  105*  --   --   HEPARINUNFRC  --    < > <0.10*   < > 0.21* 0.16* 0.10*  CREATININE 0.80  --  0.77  --  0.67  --   --   TROPONINIHS 333*  --   --   --   --   --   --    < > = values in this interval not displayed.     Estimated Creatinine Clearance: 65.7 mL/min (by C-G formula based on SCr of 0.67 mg/dL).   Medical History: Past Medical History:  Diagnosis Date   BMI 35.0-35.9,adult 07/17/2020   Closed fracture of right distal humerus 07/12/2021   HLD (hyperlipidemia)    Hypertension    Pre-diabetes    Vitamin D deficiency 07/13/2021    Medications:  Enoxaparin >> Heparin  Assessment: 72 yo F with PMH DM, HTN presents with dizziness and AMS and a 1-week history of cough. On arrival, O2Sat was 87%. ECG showed sinus tachycardia and cTn has been trending up, from 29 >> 130 >> 333. Pharmacy has been consulted to initiate heparin for 48 hrs in the setting of chest pain/ACS. Patient has been taking prophylactic enoxaparin in the ED. No chronic anticoagulation PTA noted from chart review. Hgb and PLT has trended down mildly  since admission.  Goal of Therapy:  Heparin level 0.3-0.7 units/ml Monitor platelets by anticoagulation protocol: Yes  Baseline Labs: aPTT - 27; INR - 1.2 Hgb - 10.0; Plts - 125  Date Time aPTT/HL Rate/Comment 1/21     2008     0.16                SUBtherapeutic @ 850 units/hr 1/22     0506     < 0.10             SUBtherapeutic @ 1100 un/hr  1/22 1345   0.10  Subtherapeutic @ 1350 units/hr  1/22 2146 <0.1  Subtherapeutic @ 1600 units/hr 1/23 0349 0.21  Subtherapeutic x 1 1/23  12:13 0.16  Subtherapeutic  1/23 2142 0.10  Subtherapeutic    Plan:  Heparin level is subtherapeutic. Will order heparin bolus of 2000 units x 1 and increase heparin infusion to 2500 units/hr. 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 If heparin is still subtherapeutic, consider ordering a level if heparin is to be continued. Recheck heparin level in 8 hours, CBC daily  while on heparin. Plan for [facility] 1/23. Plt continue to trend down.   Dorothe Pea, PharmD, BCPS Clinical Pharmacist   03/03/2022 10:24 PM

## 2022-03-03 NOTE — Consult Note (Signed)
[place] for heparin Indication: chest pain/ACS  Allergies  Allergen Reactions   Latex     Pt states that she is allergic to latex in pre-op.  Regardless of allergy type latex will not be used.    Bactrim [Sulfamethoxazole-Trimethoprim]     Nausea, no appitite   Penicillins     Tolerated Cephalosporin Date: 07/11/2021.      Patient Measurements: Height: 5\' 2"  (157.5 cm) Weight: 83.9 kg (184 lb 15.5 oz) IBW/kg (Calculated) : 50.1 Heparin Dosing Weight: 69  Vital Signs: Temp: 98.5 F (36.9 C) (01/23 1102) Temp Source: Oral (01/23 1102) BP: 131/73 (01/23 1102) Pulse Rate: 90 (01/23 1102)  Labs: Recent Labs    02/28/22 1454 03/01/22 0734 03/01/22 2008 03/02/22 0506 03/02/22 1345 03/02/22 2146 03/03/22 0349 03/03/22 1213  HGB  --   --   --  9.7*  --   --  10.1*  --   HCT  --   --   --  31.1*  --   --  31.5*  --   PLT  --   --   --  125*  --   --  105*  --   HEPARINUNFRC  --   --    < > <0.10*   < > <0.10* 0.21* 0.16*  CREATININE  --  0.80  --  0.77  --   --  0.67  --   TROPONINIHS 130* 333*  --   --   --   --   --   --    < > = values in this interval not displayed.     Estimated Creatinine Clearance: 65.7 mL/min (by C-G formula based on SCr of 0.67 mg/dL).   Medical History: Past Medical History:  Diagnosis Date   BMI 35.0-35.9,adult 07/17/2020   Closed fracture of right distal humerus 07/12/2021   HLD (hyperlipidemia)    Hypertension    Pre-diabetes    Vitamin D deficiency 07/13/2021    Medications:  Enoxaparin >> Heparin  Assessment: 72 yo F with PMH DM, HTN presents with dizziness and AMS and a 1-week history of cough. On arrival, O2Sat was 87%. ECG showed sinus tachycardia and cTn has been trending up, from 29 >> 130 >> 333. Pharmacy has been consulted to initiate heparin for 48 hrs in the setting of chest pain/ACS. Patient has been taking prophylactic enoxaparin in the ED. No chronic anticoagulation PTA noted from  chart review. Hgb and PLT has trended down mildly since admission.  Goal of Therapy:  Heparin level 0.3-0.7 units/ml Monitor platelets by anticoagulation protocol: Yes  Baseline Labs: aPTT - 27; INR - 1.2 Hgb - 10.0; Plts - 125  Date Time aPTT/HL Rate/Comment 1/21     2008     0.16                SUBtherapeutic @ 850 units/hr 1/22     0506     < 0.10             SUBtherapeutic @ 1100 un/hr  1/22 1345   0.10  Subtherapeutic @ 1350 units/hr  1/22 2146 <0.1  Subtherapeutic @ 1600 units/hr 1/23 0349 0.21  Subtherapeutic x 1 1/23  12:13 0.16  Subtherapeutic    Plan:  Heparin level is subtherapeutic. Will order heparin bolus of 2000 units x 1 and increase heparin infusion to 2200 units/hr. 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 If heparin is still subtherapeutic, consider ordering a level if heparin  is to be continued. Recheck heparin level in 8 hours, CBC daily while on heparin. Plan for [facility] 1/23. Plt continue to trend down.   Eleonore Chiquito, PharmD, BCPS 03/03/2022 1:16 PM

## 2022-03-03 NOTE — Consult Note (Signed)
[place] for heparin Indication: chest pain/ACS  Allergies  Allergen Reactions   Latex     Pt states that she is allergic to latex in pre-op.  Regardless of allergy type latex will not be used.    Bactrim [Sulfamethoxazole-Trimethoprim]     Nausea, no appitite   Penicillins     Tolerated Cephalosporin Date: 07/11/2021.      Patient Measurements: Height: 5\' 2"  (157.5 cm) Weight: 83.9 kg (184 lb 15.5 oz) IBW/kg (Calculated) : 50.1 Heparin Dosing Weight: 69  Vital Signs: Temp: 98.6 F (37 C) (01/23 0301) BP: 132/72 (01/23 0301) Pulse Rate: 98 (01/23 0301)  Labs: Recent Labs    02/28/22 0733 02/28/22 0734 02/28/22 1454 03/01/22 0734 03/01/22 2008 03/02/22 0506 03/02/22 1345 03/02/22 2146 03/03/22 0349  HGB 10.0*  --   --   --   --  9.7*  --   --  10.1*  HCT 31.3*  --   --   --   --  31.1*  --   --  31.5*  PLT 125*  --   --   --   --  125*  --   --  105*  APTT  --  27  --   --   --   --   --   --   --   LABPROT  --  15.1  --   --   --   --   --   --   --   INR  --  1.2  --   --   --   --   --   --   --   HEPARINUNFRC  --   --   --   --    < > <0.10* 0.10* <0.10* 0.21*  CREATININE  --   --   --  0.80  --  0.77  --   --  0.67  TROPONINIHS  --   --  130* 333*  --   --   --   --   --    < > = values in this interval not displayed.     Estimated Creatinine Clearance: 65.7 mL/min (by C-G formula based on SCr of 0.67 mg/dL).   Medical History: Past Medical History:  Diagnosis Date   BMI 35.0-35.9,adult 07/17/2020   Closed fracture of right distal humerus 07/12/2021   HLD (hyperlipidemia)    Hypertension    Pre-diabetes    Vitamin D deficiency 07/13/2021    Medications:  Enoxaparin >> Heparin  Assessment: 72 yo F with PMH DM, HTN presents with dizziness and AMS and a 1-week history of cough. On arrival, O2Sat was 87%. ECG showed sinus tachycardia and cTn has been trending up, from 29 >> 130 >> 333. Pharmacy has been consulted to  initiate heparin for 48 hrs in the setting of chest pain/ACS. Patient has been taking prophylactic enoxaparin in the ED. No chronic anticoagulation PTA noted from chart review. Hgb and PLT has trended down mildly since admission.  Heparin is intended to run for 48 hrs.  Goal of Therapy:  Heparin level 0.3-0.7 units/ml Monitor platelets by anticoagulation protocol: Yes  Baseline Labs: aPTT - 27; INR - 1.2 Hgb - 10.0; Plts - 125  Date Time aPTT/HL Rate/Comment 1/21     2008     0.16                SUBtherapeutic @ 850 units/hr 1/22  0506     < 0.10             SUBtherapeutic @ 1100 un/hr  1/22 1345   0.10  Subtherapeutic @ 1350 units/hr  1/22 2146 <0.1  Subtherapeutic @ 1600 units/hr 1/23 0349 0.21  Subtherapeutic x 1   Plan: HL subtherapeutic -Order heparin 1000 units IV x 1 bolus -Increase heparin drip rate to 2000 units/hr -Check heparin level 6 hours after rate change -Daily CBC while on heparin  Renda Rolls, PharmD, [facility] 03/03/2022 5:34 AM

## 2022-03-03 NOTE — TOC Initial Note (Signed)
Transition of Care [facility]) - Initial/Assessment Note    Patient Details  Name: Erika Perry MRN: [id number] Date of Birth: 1951-02-02  Transition of Care [facility]) CM/SW Contact:    Laurena Slimmer, RN Phone Number: 03/03/2022, 11:27 AM  Clinical Narrative:                  Transition of Care [facility]) Screening Note   Patient Details  Name: Erika Perry Date of Birth: 1950-08-19   Transition of Care [facility]) CM/SW Contact:    Laurena Slimmer, RN Phone Number: 03/03/2022, 11:27 AM    Transition of Care Department (TOC) has reviewed patient and no TOC needs have been identified at this time. We will continue to monitor patient advancement through interdisciplinary progression rounds. If new patient transition needs arise, please place a TOC consult.          Patient Goals and CMS Choice            Expected Discharge Plan and Services                                              Prior Living Arrangements/Services                       Activities of Daily Living      Permission Sought/Granted                  Emotional Assessment              Admission diagnosis:  Hypokalemia [E87.6] Elevated troponin [R79.89] Septic shock ([place]) [A41.9, R65.21] Postural dizziness with presyncope [R42, R55] COVID-19 [U07.1] Patient Active Problem List   Diagnosis Date Noted   Acute systolic CHF (congestive heart failure) ([place]) 03/02/2022   Septic shock ([place]) 03/01/2022   Dilated cardiomyopathy ([place]) 03/01/2022   COVID-19 02/28/2022   Elevated troponin 02/28/2022   Demand ischemia 02/28/2022   E coli bacteremia 02/28/2022   Hypokalemia 02/27/2022   Postural dizziness with presyncope 02/27/2022   NSTEMI (non-ST elevated myocardial infarction) ([place]) 02/27/2022   Tinea corporis 01/19/2022   Vaginal itching 12/23/2021   Vitamin D deficiency 07/13/2021   Closed fracture of right distal humerus 07/12/2021   Pain in joint of right elbow 07/04/2021    BMI 35.0-35.9,adult 07/17/2020   Prediabetes 07/17/2020   Urinary tract infection without hematuria 04/20/2020   Uncontrolled type 2 diabetes mellitus with hyperglycemia, without long-term current use of insulin ([place]) 07/18/2018   Hyperlipidemia 07/18/2018   Chronic venous insufficiency 03/24/2017   Lymphedema 03/24/2017   Hypertension 02/23/2017   Bilateral lower extremity edema 02/23/2017   PCP:  Leone Haven, MD Pharmacy:   [facility] [phone] [address], [place] AT [facility] OF [address] [place] [address] Phone: [phone] Fax: [phone]     Social Determinants of Health (SDOH) Social History: SDOH Screenings   Food Insecurity: No Food Insecurity (08/07/2021)  Housing: Low Risk  (08/07/2021)  Transportation Needs: No Transportation Needs (08/07/2021)  Alcohol Screen: Low Risk  (04/16/2020)  Depression (PHQ2-9): Low Risk  (12/23/2021)  Financial Resource Strain: Low Risk  (08/07/2021)  Social Connections: Unknown (08/07/2021)  Stress: No Stress Concern Present (08/07/2021)  Tobacco Use: Low Risk  (02/27/2022)   SDOH Interventions:     Readmission  Risk Interventions     No data to display

## 2022-03-04 ENCOUNTER — Encounter
Admission: EM | Disposition: A | Discharge status: Home Health | Payer: Self-pay | Source: Home / Self Care | Attending: Internal Medicine

## 2022-03-04 ENCOUNTER — Other Ambulatory Visit (HOSPITAL_COMMUNITY): Payer: Self-pay

## 2022-03-04 DIAGNOSIS — I2510 Atherosclerotic heart disease of native coronary artery without angina pectoris: Secondary | ICD-10-CM | POA: Diagnosis not present

## 2022-03-04 DIAGNOSIS — R6521 Severe sepsis with septic shock: Secondary | ICD-10-CM | POA: Diagnosis not present

## 2022-03-04 DIAGNOSIS — A419 Sepsis, unspecified organism: Secondary | ICD-10-CM | POA: Diagnosis not present

## 2022-03-04 HISTORY — PX: RIGHT/LEFT HEART CATH AND CORONARY ANGIOGRAPHY: CATH118266

## 2022-03-04 LAB — GLUCOSE, CAPILLARY
Glucose-Capillary: 136 mg/dL — ABNORMAL HIGH (ref 70–99)
Glucose-Capillary: 136 mg/dL — ABNORMAL HIGH (ref 70–99)
Glucose-Capillary: 138 mg/dL — ABNORMAL HIGH (ref 70–99)
Glucose-Capillary: 138 mg/dL — ABNORMAL HIGH (ref 70–99)
Glucose-Capillary: 142 mg/dL — ABNORMAL HIGH (ref 70–99)
Glucose-Capillary: 146 mg/dL — ABNORMAL HIGH (ref 70–99)

## 2022-03-04 LAB — POCT I-STAT 7, (LYTES, BLD GAS, ICA,H+H)
Acid-base deficit: 2 mmol/L (ref 0.0–2.0)
Bicarbonate: 22.6 mmol/L (ref 20.0–28.0)
Calcium, Ion: 1.15 mmol/L (ref 1.15–1.40)
HCT: 30 % — ABNORMAL LOW (ref 36.0–46.0)
Hemoglobin: 10.2 g/dL — ABNORMAL LOW (ref 12.0–15.0)
O2 Saturation: 98 %
Potassium: 3.9 mmol/L (ref 3.5–5.1)
Sodium: 135 mmol/L (ref 135–145)
TCO2: 24 mmol/L (ref 22–32)
pCO2 arterial: 39.4 mmHg (ref 32–48)
pH, Arterial: 7.367 (ref 7.35–7.45)
pO2, Arterial: 101 mmHg (ref 83–108)

## 2022-03-04 LAB — BASIC METABOLIC PANEL
Anion gap: 6 (ref 5–15)
BUN: 8 mg/dL (ref 8–23)
CO2: 24 mmol/L (ref 22–32)
Calcium: 7.9 mg/dL — ABNORMAL LOW (ref 8.9–10.3)
Chloride: 103 mmol/L (ref 98–111)
Creatinine, Ser: 0.65 mg/dL (ref 0.44–1.00)
GFR, Estimated: >60 mL/min (ref >60)
Glucose, Bld: 136 mg/dL — ABNORMAL HIGH (ref 70–99)
Potassium: 3.7 mmol/L (ref 3.5–5.1)
Sodium: 133 mmol/L — ABNORMAL LOW (ref 135–145)

## 2022-03-04 LAB — URINE CULTURE: Culture: NO GROWTH

## 2022-03-04 LAB — CBC
HCT: 31.5 % — ABNORMAL LOW (ref 36.0–46.0)
Hemoglobin: 10.1 g/dL — ABNORMAL LOW (ref 12.0–15.0)
MCH: 28.4 pg (ref 26.0–34.0)
MCHC: 32.1 g/dL (ref 30.0–36.0)
MCV: 88.5 fL (ref 80.0–100.0)
Platelets: 121 10*3/uL — ABNORMAL LOW (ref 150–400)
RBC: 3.56 MIL/uL — ABNORMAL LOW (ref 3.87–5.11)
RDW: 17.2 % — ABNORMAL HIGH (ref 11.5–15.5)
WBC: 4.3 10*3/uL (ref 4.0–10.5)
nRBC: 0 % (ref 0.0–0.2)

## 2022-03-04 LAB — HEPARIN LEVEL (UNFRACTIONATED): Heparin Unfractionated: 0.58 IU/mL (ref 0.30–0.70)

## 2022-03-04 SURGERY — RIGHT/LEFT HEART CATH AND CORONARY ANGIOGRAPHY
Anesthesia: Moderate Sedation

## 2022-03-04 MED ORDER — SODIUM CHLORIDE 0.9% FLUSH: 3.0000 mL | INTRAVENOUS | Status: DC | PRN

## 2022-03-04 MED ORDER — SODIUM CHLORIDE 0.9 % IV SOLN: INTRAVENOUS | Status: AC

## 2022-03-04 MED ORDER — DAPAGLIFLOZIN PROPANEDIOL 10 MG PO TABS
10.0000 mg | ORAL_TABLET | Freq: Every day | ORAL | Status: DC
  Administered 2022-03-04 – 2022-03-05 (×2): 10 mg via ORAL
  Filled 2022-03-04 (×2): qty 1

## 2022-03-04 MED ORDER — HEPARIN (PORCINE) IN NACL 1000-0.9 UT/500ML-% IV SOLN
INTRAVENOUS | Status: DC | PRN
  Administered 2022-03-04: 1000 mL

## 2022-03-04 MED ORDER — FENTANYL CITRATE (PF) 100 MCG/2ML IJ SOLN
INTRAMUSCULAR | Status: DC | PRN
  Administered 2022-03-04: 25 ug via INTRAVENOUS

## 2022-03-04 MED ORDER — FENTANYL CITRATE (PF) 100 MCG/2ML IJ SOLN
INTRAMUSCULAR | Status: AC
  Filled 2022-03-04: qty 2

## 2022-03-04 MED ORDER — IOHEXOL 300 MG/ML  SOLN
INTRAMUSCULAR | Status: DC | PRN
  Administered 2022-03-04: 52 mL

## 2022-03-04 MED ORDER — MIDAZOLAM HCL 2 MG/2ML IJ SOLN
INTRAMUSCULAR | Status: AC
  Filled 2022-03-04: qty 2

## 2022-03-04 MED ORDER — SODIUM CHLORIDE 0.9 % IV SOLN: 250.0000 mL | INTRAVENOUS | Status: DC | PRN

## 2022-03-04 MED ORDER — SPIRONOLACTONE 12.5 MG HALF TABLET
12.5000 mg | ORAL_TABLET | Freq: Every day | ORAL | Status: DC
  Administered 2022-03-04 – 2022-03-05 (×2): 12.5 mg via ORAL
  Filled 2022-03-04 (×2): qty 1

## 2022-03-04 MED ORDER — SODIUM CHLORIDE 0.9% FLUSH
3.0000 mL | Freq: Two times a day (BID) | INTRAVENOUS | Status: DC
  Administered 2022-03-04 – 2022-03-05 (×3): 3 mL via INTRAVENOUS

## 2022-03-04 MED ORDER — MIDAZOLAM HCL 2 MG/2ML IJ SOLN
INTRAMUSCULAR | Status: DC | PRN
  Administered 2022-03-04: 1 mg via INTRAVENOUS

## 2022-03-04 MED ORDER — ASPIRIN 81 MG PO CHEW
81.0000 mg | CHEWABLE_TABLET | Freq: Every day | ORAL | Status: DC
  Administered 2022-03-04 – 2022-03-05 (×2): 81 mg via ORAL
  Filled 2022-03-04 (×2): qty 1

## 2022-03-04 SURGICAL SUPPLY — 12 items
CANNULA 5F STIFF (CANNULA) IMPLANT
CATH INFINITI 5FR MULTPACK ANG (CATHETERS) IMPLANT
CATH SWAN GANZ 7F STRAIGHT (CATHETERS) IMPLANT
DEVICE CLOSURE MYNXGRIP 5F (Vascular Products) IMPLANT
PACK CARDIAC CATH (CUSTOM PROCEDURE TRAY) ×1 IMPLANT
PROTECTION STATION PRESSURIZED (MISCELLANEOUS) ×1
SET ATX SIMPLICITY (MISCELLANEOUS) IMPLANT
SHEATH AVANTI 5FR X 11CM (SHEATH) IMPLANT
SHEATH AVANTI 7FRX11 (SHEATH) IMPLANT
STATION PROTECTION PRESSURIZED (MISCELLANEOUS) IMPLANT
WIRE GUIDERIGHT .035X150 (WIRE) IMPLANT
WIRE NITINOL .018 (WIRE) IMPLANT

## 2022-03-04 NOTE — Consult Note (Addendum)
ANTICOAGULATION CONSULT NOTE  Pharmacy Consult for IV Heparin Indication: chest pain/ACS  Patient Measurements: Height: 5\' 2"  (157.5 cm) Weight: 84.6 kg (186 lb 8.2 oz) IBW/kg (Calculated) : 50.1 Heparin Dosing Weight: 69  Labs: Recent Labs    03/02/22 0506 03/02/22 1345 03/03/22 0349 03/03/22 1213 03/03/22 2142 03/04/22 0501 03/04/22 0506  HGB 9.7*  --  10.1*  --   --  10.1*  --   HCT 31.1*  --  31.5*  --   --  31.5*  --   PLT 125*  --  105*  --   --  121*  --   HEPARINUNFRC <0.10*   < > 0.21* 0.16* 0.10*  --  0.58  CREATININE 0.77  --  0.67  --   --  0.65  --    < > = values in this interval not displayed.    Estimated Creatinine Clearance: 66 mL/min (by C-G formula based on SCr of 0.65 mg/dL).  Medical History: Past Medical History:  Diagnosis Date   BMI 35.0-35.9,adult 07/17/2020   Closed fracture of right distal humerus 07/12/2021   HLD (hyperlipidemia)    Hypertension    Pre-diabetes    Vitamin D deficiency 07/13/2021   Assessment: 72 yo F with PMH DM, HTN presents with dizziness and AMS and a 1-week history of cough. On arrival, O2Sat was 87%. ECG showed sinus tachycardia and cTn has been trending up, from 29 >> 130 >> 333. Pharmacy has been consulted to initiate heparin for 48 hrs in the setting of chest pain/ACS. Patient has been taking prophylactic enoxaparin in the ED. No chronic anticoagulation PTA noted from chart review. Hgb and PLT has trended down mildly since admission.  Goal of Therapy:  Heparin level 0.3-0.7 units/ml Monitor platelets by anticoagulation protocol: Yes  Baseline Labs: aPTT - 27; INR - 1.2 Hgb - 10.0; Plts - 125  Date Time aPTT/HL Rate/Comment 1/21     2008     0.16                SUBthera; 850 un/hr 1/22     0506     < 0.10             SUBthera; 1100 un/hr  1/22 1345   0.10  SUBthera; 1350 un/hr  1/22 2146 <0.1  SUBthera; 1600 un/hr 1/23 0349 0.21  SUBthera; 1850 un/hr 1/23  12:13 0.16  SUBthera; 2000 un/hr   1/23 2142 0.10  SUBthera; 2200 un/hr  1/24 0506 0.58  Thera x 1; 2500 un/hr   Plan:  --Heparin level is therapeutic x 1 --Continue heparin infusion at 2500 un/hr --Confirmatory heparin level in 8 hours --CBC daily while on heparin --Pending cardiac catheterization  Benita Gutter 03/04/2022 7:57 AM

## 2022-03-04 NOTE — TOC Benefit Eligibility Note (Signed)
Patient Advocate Encounter  Insurance verification completed.    The patient is currently admitted and upon discharge could be taking Entresto 24-26 mg.  The current 30 day co-pay is $0.00.   The patient is currently admitted and upon discharge could be taking Farxiga 10 mg.  The current 30 day co-pay is $0.00.   The patient is insured through [employer] Medicare Part D   Mehar Kirkwood Vantil, CPHT Pharmacy Patient Advocate Specialist [facility] Patient Advocate Team Direct Number: [phone]  Fax: [phone]       

## 2022-03-04 NOTE — H&P (View-Only) (Signed)
Patient ID: Erika Perry, female   DOB: 24-Sep-1950, 72 y.o.   MRN: [id number]   Progress Note  Patient Name: Erika Perry Date of Encounter: 03/04/2022  Primary Cardiologist: new - consult by [place]   Subjective   No chest pain or dyspnea this morning.   Inpatient Medications    Scheduled Meds:  aspirin  81 mg Oral Pre-Cath   insulin aspart  0-15 Units Subcutaneous TID WC   insulin aspart  0-5 Units Subcutaneous QHS   potassium chloride  40 mEq Oral Daily   rosuvastatin  20 mg Oral Daily   sodium chloride flush  3 mL Intravenous Q12H   sodium chloride flush  3 mL Intravenous Q12H   Continuous Infusions:  sodium chloride Stopped (02/28/22 1430)   sodium chloride     sodium chloride      ceFAZolin (ANCEF) IV     heparin 2,500 Units/hr (03/04/22 0456)   PRN Meds: sodium chloride, acetaminophen **OR** acetaminophen, albuterol, HYDROcodone-acetaminophen, ondansetron (ZOFRAN) IV, sodium chloride flush   Vital Signs    Vitals:   03/03/22 1800 03/04/22 0052 03/04/22 0318 03/04/22 0605  BP:  (!) 152/79 (!) 144/85   Pulse:  89 93   Resp: 16 (!) 24 (!) 24   Temp:  98.2 F (36.8 C) 98.2 F (36.8 C)   TempSrc:      SpO2:  96% 95%   Weight:    84.6 kg  Height:        Intake/Output Summary (Last 24 hours) at 03/04/2022 0922 Last data filed at 03/04/2022 0500 Gross per 24 hour  Intake 538.46 ml  Output --  Net 538.46 ml   Filed Weights   02/28/22 0155 03/01/22 1300 03/04/22 0605  Weight: 83.9 kg 83.9 kg 84.6 kg    Telemetry    SR, 90s bpm - Personally Reviewed  ECG    No new tracings - Personally Reviewed  Physical Exam   General: NAD Neck: JVP 8-9 cm, no thyromegaly or thyroid nodule.  Lungs: Clear to auscultation bilaterally with normal respiratory effort. CV: Nondisplaced PMI.  Heart regular S1/S2, no S3/S4, no murmur.  No peripheral edema.   Abdomen: Soft, nontender, no hepatosplenomegaly, no distention.  Skin: Intact without lesions or rashes.   Neurologic: Alert and oriented x 3.  Psych: Normal affect. Extremities: No clubbing or cyanosis.  HEENT: Normal.    Labs    Chemistry Recent Labs  Lab 03/01/22 0734 03/02/22 0506 03/03/22 0349 03/04/22 0501  NA 133* 134* 132* 133*  K 3.5 3.4* 3.3* 3.7  CL 98 99 98 103  CO2 28 28 26 24   GLUCOSE 247* 148* 147* 136*  BUN 21 14 11 8   CREATININE 0.80 0.77 0.67 0.65  CALCIUM 7.6* 7.8* 8.1* 7.9*  PROT 5.9*  --   --   --   ALBUMIN 2.5*  --   --   --   AST 23  --   --   --   ALT 15  --   --   --   ALKPHOS 57  --   --   --   BILITOT 1.7*  --   --   --   GFRNONAA >60 >60 >60 >60  ANIONGAP 7 7 8 6      Hematology Recent Labs  Lab 03/02/22 0506 03/03/22 0349 03/04/22 0501  WBC 5.5 3.8* 4.3  RBC 3.42* 3.53* 3.56*  HGB 9.7* 10.1* 10.1*  HCT 31.1* 31.5* 31.5*  MCV 90.9 89.2 88.5  MCH  28.4 28.6 28.4  MCHC 31.2 32.1 32.1  RDW 17.8* 17.2* 17.2*  PLT 125* 105* 121*    Cardiac EnzymesNo results for input(s): "TROPONINI" in the last 168 hours. No results for input(s): "TROPIPOC" in the last 168 hours.   BNPNo results for input(s): "BNP", "PROBNP" in the last 168 hours.   DDimer No results for input(s): "DDIMER" in the last 168 hours.   Radiology    CT Angio Chest Pulmonary Embolism (PE) W or WO Contrast  Result Date: 02/28/2022 IMPRESSION: 1. No pulmonary embolism. 2. Moderate coronary artery calcification within the left anterior descending coronary artery. 3. Scattered bilateral ground-glass opacity likely relates to atelectasis due to expiratory phase imaging. No definite superimposed focal pulmonary nodule or infiltrate. Aortic Atherosclerosis (ICD10-I70.0). Electronically Signed   By: Fidela Salisbury M.D.   On: 02/28/2022 02:13   DG Chest Portable 1 View  Result Date: 02/27/2022 IMPRESSION: 1. No acute cardiopulmonary process. 2. Faint focal area of increased density in the right suprahilar region. Follow-up with chest radiograph or CT on a nonemergent/outpatient basis  recommended. Electronically Signed   By: Anner Crete M.D.   On: 02/27/2022 21:53    Cardiac Studies   2D echo 02/28/2022: 1. Left ventricular ejection fraction, by estimation, is 35 to 40%. Left  ventricular ejection fraction by 2D MOD biplane is 41.3 %. The left  ventricle has moderately decreased function. The left ventricle  demonstrates global hypokinesis. Left  ventricular diastolic parameters are consistent with Grade I diastolic  dysfunction (impaired relaxation).   2. Right ventricular systolic function is normal. The right ventricular  size is normal. There is normal pulmonary artery systolic pressure. The  estimated right ventricular systolic pressure is 84.1 mmHg.   3. The mitral valve is normal in structure. Mild mitral valve  regurgitation. No evidence of mitral stenosis.   4. Tricuspid valve regurgitation is mild to moderate.   5. The aortic valve is tricuspid. Aortic valve regurgitation is not  visualized. No aortic stenosis is present.   6. The inferior vena cava is normal in size with greater than 50%  respiratory variability, suggesting right atrial pressure of 3 mmHg.   Patient Profile     72 y.o. female with history of uncontrolled DM, HTN, and HLD who was admitted with severe sepsis secondary to COVID-19 infection and UTI and is being seen by cardiology for the evaluation of mildly elevated high sensitivity troponin at the request of Dr. Damita Dunnings, found to have acute HFrEF.  Assessment & Plan    1.  Acute systolic heart failure: Cardiomyopathy of uncertain etiology.  Echo this admission with EF of 35 to 40%.  High risk for underlying ischemic heart disease considering her risk factors and uncontrolled diabetes.  Possible mild volume overload by exam.  - spironolactone 12.5 mg daily.  - Farxiga 10 mg daily.  - Would like to add Entresto post-cath as long as creatinine remains stable.  - RHC/LHC planned for today. Discussed risks/benefits with patient and she  agrees to procedure.  - Will diurese if filling pressures elevated on cath.  2.  Elevated troponin: Peak 333. Not able to determine at this point if this is due to acute coronary syndrome or demand ischemia.  CT scan of the chest did show evidence of coronary artery calcifications especially in the LAD distribution. - Heparin gtt.  - ASA 81 - Crestor.  3.  Septic shock due to E. coli bacteremia and UTI:  Significant improvement with antibiotics and IV fluids. -  Remains on cefazolin.  4. Poorly controlled diabetes: Last A1c 11.1 -Management per primary service   For questions or updates, please contact [place] Please consult [email or web address] for contact info under Cardiology/STEMI.    Signed Loralie Champagne 03/04/2022 9:27 AM

## 2022-03-04 NOTE — Progress Notes (Signed)
Patient ID: Erika Perry, female   DOB: 02/02/1951, 72 y.o.   MRN: [id number]   Progress Note  Patient Name: Andersen D [place]ono Date of Encounter: 03/04/2022  Primary Cardiologist: new - consult by Gollan   Subjective   No chest pain or dyspnea this morning.   Inpatient Medications    Scheduled Meds:  aspirin  81 mg Oral Pre-Cath   insulin aspart  0-15 Units Subcutaneous TID WC   insulin aspart  0-5 Units Subcutaneous QHS   potassium chloride  40 mEq Oral Daily   rosuvastatin  20 mg Oral Daily   sodium chloride flush  3 mL Intravenous Q12H   sodium chloride flush  3 mL Intravenous Q12H   Continuous Infusions:  sodium chloride Stopped (02/28/22 1430)   sodium chloride     sodium chloride      ceFAZolin (ANCEF) IV     heparin 2,500 Units/hr (03/04/22 0456)   PRN Meds: sodium chloride, acetaminophen **OR** acetaminophen, albuterol, HYDROcodone-acetaminophen, ondansetron (ZOFRAN) IV, sodium chloride flush   Vital Signs    Vitals:   03/03/22 1800 03/04/22 0052 03/04/22 0318 03/04/22 0605  BP:  (!) 152/79 (!) 144/85   Pulse:  89 93   Resp: 16 (!) 24 (!) 24   Temp:  98.2 F (36.8 C) 98.2 F (36.8 C)   TempSrc:      SpO2:  96% 95%   Weight:    84.6 kg  Height:        Intake/Output Summary (Last 24 hours) at 03/04/2022 0922 Last data filed at 03/04/2022 0500 Gross per 24 hour  Intake 538.46 ml  Output --  Net 538.46 ml   Filed Weights   02/28/22 0155 03/01/22 1300 03/04/22 0605  Weight: 83.9 kg 83.9 kg 84.6 kg    Telemetry    SR, 90s bpm - Personally Reviewed  ECG    No new tracings - Personally Reviewed  Physical Exam   General: NAD Neck: JVP 8-9 cm, no thyromegaly or thyroid nodule.  Lungs: Clear to auscultation bilaterally with normal respiratory effort. CV: Nondisplaced PMI.  Heart regular S1/S2, no S3/S4, no murmur.  No peripheral edema.   Abdomen: Soft, nontender, no hepatosplenomegaly, no distention.  Skin: Intact without lesions or rashes.   Neurologic: Alert and oriented x 3.  Psych: Normal affect. Extremities: No clubbing or cyanosis.  HEENT: Normal.    Labs    Chemistry Recent Labs  Lab 03/01/22 0734 03/02/22 0506 03/03/22 0349 03/04/22 0501  NA 133* 134* 132* 133*  K 3.5 3.4* 3.3* 3.7  CL 98 99 98 103  CO2 28 28 26 24  GLUCOSE 247* 148* 147* 136*  BUN 21 14 11 8  CREATININE 0.80 0.77 0.67 0.65  CALCIUM 7.6* 7.8* 8.1* 7.9*  PROT 5.9*  --   --   --   ALBUMIN 2.5*  --   --   --   AST 23  --   --   --   ALT 15  --   --   --   ALKPHOS 57  --   --   --   BILITOT 1.7*  --   --   --   GFRNONAA >60 >60 >60 >60  ANIONGAP 7 7 8 6     Hematology Recent Labs  Lab 03/02/22 0506 03/03/22 0349 03/04/22 0501  WBC 5.5 3.8* 4.3  RBC 3.42* 3.53* 3.56*  HGB 9.7* 10.1* 10.1*  HCT 31.1* 31.5* 31.5*  MCV 90.9 89.2 88.5  MCH   28.4 28.6 28.4  MCHC 31.2 32.1 32.1  RDW 17.8* 17.2* 17.2*  PLT 125* 105* 121*    Cardiac EnzymesNo results for input(s): "TROPONINI" in the last 168 hours. No results for input(s): "TROPIPOC" in the last 168 hours.   BNPNo results for input(s): "BNP", "PROBNP" in the last 168 hours.   DDimer No results for input(s): "DDIMER" in the last 168 hours.   Radiology    CT Angio Chest Pulmonary Embolism (PE) W or WO Contrast  Result Date: 02/28/2022 IMPRESSION: 1. No pulmonary embolism. 2. Moderate coronary artery calcification within the left anterior descending coronary artery. 3. Scattered bilateral ground-glass opacity likely relates to atelectasis due to expiratory phase imaging. No definite superimposed focal pulmonary nodule or infiltrate. Aortic Atherosclerosis (ICD10-I70.0). Electronically Signed   By: Fidela Salisbury M.D.   On: 02/28/2022 02:13   DG Chest Portable 1 View  Result Date: 02/27/2022 IMPRESSION: 1. No acute cardiopulmonary process. 2. Faint focal area of increased density in the right suprahilar region. Follow-up with chest radiograph or CT on a nonemergent/outpatient basis  recommended. Electronically Signed   By: Anner Crete M.D.   On: 02/27/2022 21:53    Cardiac Studies   2D echo 02/28/2022: 1. Left ventricular ejection fraction, by estimation, is 35 to 40%. Left  ventricular ejection fraction by 2D MOD biplane is 41.3 %. The left  ventricle has moderately decreased function. The left ventricle  demonstrates global hypokinesis. Left  ventricular diastolic parameters are consistent with Grade I diastolic  dysfunction (impaired relaxation).   2. Right ventricular systolic function is normal. The right ventricular  size is normal. There is normal pulmonary artery systolic pressure. The  estimated right ventricular systolic pressure is 84.1 mmHg.   3. The mitral valve is normal in structure. Mild mitral valve  regurgitation. No evidence of mitral stenosis.   4. Tricuspid valve regurgitation is mild to moderate.   5. The aortic valve is tricuspid. Aortic valve regurgitation is not  visualized. No aortic stenosis is present.   6. The inferior vena cava is normal in size with greater than 50%  respiratory variability, suggesting right atrial pressure of 3 mmHg.   Patient Profile     72 y.o. female with history of uncontrolled DM, HTN, and HLD who was admitted with severe sepsis secondary to COVID-19 infection and UTI and is being seen by cardiology for the evaluation of mildly elevated high sensitivity troponin at the request of Dr. Damita Dunnings, found to have acute HFrEF.  Assessment & Plan    1.  Acute systolic heart failure: Cardiomyopathy of uncertain etiology.  Echo this admission with EF of 35 to 40%.  High risk for underlying ischemic heart disease considering her risk factors and uncontrolled diabetes.  Possible mild volume overload by exam.  - spironolactone 12.5 mg daily.  - Farxiga 10 mg daily.  - Would like to add Entresto post-cath as long as creatinine remains stable.  - RHC/LHC planned for today. Discussed risks/benefits with patient and she  agrees to procedure.  - Will diurese if filling pressures elevated on cath.  2.  Elevated troponin: Peak 333. Not able to determine at this point if this is due to acute coronary syndrome or demand ischemia.  CT scan of the chest did show evidence of coronary artery calcifications especially in the LAD distribution. - Heparin gtt.  - ASA 81 - Crestor.  3.  Septic shock due to E. coli bacteremia and UTI:  Significant improvement with antibiotics and IV fluids. -  Remains on cefazolin.  4. Poorly controlled diabetes: Last A1c 11.1 -Management per primary service   For questions or updates, please contact [place] Please consult [email or web address] for contact info under Cardiology/STEMI.    Signed Loralie Champagne 03/04/2022 9:27 AM

## 2022-03-04 NOTE — Interval H&P Note (Signed)
History and Physical Interval Note:  03/04/2022 10:42 AM  Erika Perry  has presented today for surgery, with the diagnosis of acute systolic heart failure.  The various methods of treatment have been discussed with the patient and family. After consideration of risks, benefits and other options for treatment, the patient has consented to  Procedure(s): RIGHT/LEFT HEART CATH AND CORONARY ANGIOGRAPHY (N/A) as a surgical intervention.  The patient's history has been reviewed, patient examined, no change in status, stable for surgery.  I have reviewed the patient's chart and labs.  Questions were answered to the patient's satisfaction.     Kathlyn [place]

## 2022-03-04 NOTE — Progress Notes (Signed)
PROGRESS NOTE    Erika Perry  IOE:[id number] DOB: Oct 18, 1950 DOA: 02/27/2022 PCP: Leone Haven, MD    Brief Narrative:  Erika Perry is a 72 y.o. female with medical history significant for hypertension, type II DM, obesity, vitamin D deficiency, who presented to the hospital because of confusion and dizziness from standing.  She also reported having chills shortly before admission.  She had been coughing for over a week.  Oxygen saturation was 87% on room air when EMS arrived.     She was hypotensive on admission.  Lactic acid was 6.7, procalcitonin was 34.62, sodium was 131, potassium 2.6, urinalysis was suggestive of UTI and COVID test was positive. She was admitted to the hospital for severe sepsis secondary to acute UTI.  She was treated with IV fluids and empiric IV antibiotics.  Hypotension worsened despite treatment with IV fluids.  She was started on IV Levophed infusion for septic shock.  Intensivist was consulted to assist with management. She required 2 L/min oxygen for acute hypoxic respiratory failure from severe sepsis.  Cardiologist was consulted for elevated troponins.  Patient underwent cardiac catheterization on 1/24.  Clean coronary arteries with optimize volume status.  Improving from sepsis standpoint.  Remained stable on intravenous Ancef.  Blood pressures have improved.    Assessment & Plan:   Principal Problem:   Septic shock ([place]) Active Problems:   COVID-19   E coli bacteremia   Urinary tract infection without hematuria   Postural dizziness with presyncope   NSTEMI (non-ST elevated myocardial infarction) ([place])   Hypokalemia   Uncontrolled type 2 diabetes mellitus with hyperglycemia, without long-term current use of insulin ([facility])   Hypertension   Elevated troponin   Demand ischemia   Dilated cardiomyopathy ([facility])   Acute systolic CHF (congestive heart failure) ([place])  Septic shock secondary to E. coli bacteremia and UTI: Patient remains Means  stable intravenous Ancef.  Lactic acidosis has resolved.  Initially required vasopressor support.  That has been weaned off.  Blood pressure stable without pressor support.  Will continue Ancef for now.  Consider transition to p.o. antibiotics on 1/25.     Type II NSTEMI, demand ischemia: Probably due to demand ischemia but unable to rule out ACS completely.  Patient underwent cardiac catheterization 1/24.  Clean coronary arteries with optimize volume status.   Acute systolic CHF: Hypotension limits the use of goal-directed medical therapy.  2D echo showed EF estimated at 35 to 18%, grade 1 diastolic dysfunction mild TR, mild MR, mild to moderate TR.     Acute hypoxic respiratory failure: Improved.  She is tolerating room air.  Use oxygen as needed.   Hypokalemia: Replace potassium as necessary     Worsening thrombocytopenia: Platelet count is still trending down.  Monitor closely.     Postural dizziness: Resolved     COVID-19 infection: Cough started over a week prior to admission so patient was not treated for COVID.Marland Kitchen  Remains COVID-positive on this admission.     Type II DM with hyperglycemia: Continue NovoLog as needed for hyperglycemia.  Hemoglobin A1c was 9.  Of note, hemoglobin A1c was 11.1 on 12/23/2021.     Hyperglycemia induced hyponatremia: Monitor BMP   DVT prophylaxis: Heparin Code Status: Full Family Communication: None Disposition Plan: Status is: Inpatient Remains inpatient appropriate because: Resolving sepsis.  Possible discharge in 24 hours.   Level of care: Progressive  Consultants:  Cardiology  Procedures:  Left heart catheterization 1/24  Antimicrobials: Ancef   Subjective:  Seen and examined.  Resting comfortably in bed.  No visible distress.  No complaints of pain.  Objective: Vitals:   03/04/22 0605 03/04/22 0931 03/04/22 1221 03/04/22 1557  BP:  130/75 120/64 127/78  Pulse:  89 88 93  Resp:  18 20 20   Temp:  98.2 F (36.8 C) 98.2 F  (36.8 C) 98.6 F (37 C)  TempSrc:    Oral  SpO2:  94% 96% 95%  Weight: 84.6 kg     Height:        Intake/Output Summary (Last 24 hours) at 03/04/2022 1618 Last data filed at 03/04/2022 0500 Gross per 24 hour  Intake 356.69 ml  Output --  Net 356.69 ml   Filed Weights   02/28/22 0155 03/01/22 1300 03/04/22 0605  Weight: 83.9 kg 83.9 kg 84.6 kg    Examination:  General exam: Appears calm and comfortable  Respiratory system: Clear to auscultation. Respiratory effort normal. Cardiovascular system: S1-S2, RRR, no murmurs, no pedal edema Gastrointestinal system: Soft, NT/ND, normal bowel sounds Central nervous system: Alert and oriented. No focal neurological deficits. Extremities: Symmetric 5 x 5 power. Skin: No rashes, lesions or ulcers Psychiatry: Judgement and insight appear normal. Mood & affect appropriate.     Data Reviewed: I have personally reviewed following labs and imaging studies  CBC: Recent Labs  Lab 02/28/22 0132 02/28/22 0733 03/02/22 0506 03/03/22 0349 03/04/22 0501 03/04/22 1111  WBC 9.6 6.8 5.5 3.8* 4.3  --   NEUTROABS  --   --  3.5 2.3  --   --   HGB 10.5* 10.0* 9.7* 10.1* 10.1* 10.2*  HCT 32.7* 31.3* 31.1* 31.5* 31.5* 30.0*  MCV 87.9 90.5 90.9 89.2 88.5  --   PLT 139* 125* 125* 105* 121*  --    Basic Metabolic Panel: Recent Labs  Lab 02/27/22 1811 02/28/22 0132 02/28/22 0256 03/01/22 0734 03/02/22 0506 03/03/22 0349 03/04/22 0501 03/04/22 1111  NA 131*  --  133* 133* 134* 132* 133* 135  K 2.6*  --  4.0 3.5 3.4* 3.3* 3.7 3.9  CL 94*  --  97* 98 99 98 103  --   CO2 24  --  23 28 28 26 24   --   GLUCOSE 338*  --  230* 247* 148* 147* 136*  --   BUN 16  --  19 21 14 11 8   --   CREATININE 0.89   < > 1.15* 0.80 0.77 0.67 0.65  --   CALCIUM 8.5*  --  8.2* 7.6* 7.8* 8.1* 7.9*  --   MG 1.7  --   --   --  1.8 1.8  --   --   PHOS  --   --   --   --  2.2*  --   --   --    < > = values in this interval not displayed.   GFR: Estimated  Creatinine Clearance: 66 mL/min (by C-G formula based on SCr of 0.65 mg/dL). Liver Function Tests: Recent Labs  Lab 03/01/22 0734  AST 23  ALT 15  ALKPHOS 57  BILITOT 1.7*  PROT 5.9*  ALBUMIN 2.5*   No results for input(s): "LIPASE", "AMYLASE" in the last 168 hours. No results for input(s): "AMMONIA" in the last 168 hours. Coagulation Profile: Recent Labs  Lab 02/28/22 0734  INR 1.2   Cardiac Enzymes: No results for input(s): "CKTOTAL", "CKMB", "CKMBINDEX", "TROPONINI" in the last 168 hours. BNP (last 3 results) No results for input(s): "PROBNP" in the  last 8760 hours. HbA1C: No results for input(s): "HGBA1C" in the last 72 hours. CBG: Recent Labs  Lab 03/03/22 2152 03/04/22 0055 03/04/22 0320 03/04/22 0756 03/04/22 1222  GLUCAP 134* 138* 138* 136* 136*   Lipid Profile: No results for input(s): "CHOL", "HDL", "LDLCALC", "TRIG", "CHOLHDL", "LDLDIRECT" in the last 72 hours. Thyroid Function Tests: No results for input(s): "TSH", "T4TOTAL", "FREET4", "T3FREE", "THYROIDAB" in the last 72 hours. Anemia Panel: No results for input(s): "VITAMINB12", "FOLATE", "FERRITIN", "TIBC", "IRON", "RETICCTPCT" in the last 72 hours. Sepsis Labs: Recent Labs  Lab 02/28/22 0031 02/28/22 0132 02/28/22 0347 03/01/22 0734  PROCALCITON  --  34.62  --   --   LATICACIDVEN 6.7*  --  2.6* 1.0    Recent Results (from the past 240 hour(s))  Resp panel by RT-PCR (RSV, Flu A&B, Covid) Anterior Nasal Swab     Status: Abnormal   Collection Time: 02/27/22  9:08 PM   Specimen: Anterior Nasal Swab  Result Value Ref Range Status   SARS Coronavirus 2 by RT PCR POSITIVE (A) NEGATIVE Final    Comment: (NOTE) SARS-CoV-2 target nucleic acids are DETECTED.  The SARS-CoV-2 RNA is generally detectable in upper respiratory specimens during the acute phase of infection. Positive results are indicative of the presence of the identified virus, but do not rule out bacterial infection or co-infection  with other pathogens not detected by the test. Clinical correlation with patient history and other diagnostic information is necessary to determine patient infection status. The expected result is Negative.  Fact Sheet for Patients: BloggerCourse.com  Fact Sheet for Healthcare Providers: SeriousBroker.it  This test is not yet approved or cleared by the [place] FDA and  has been authorized for detection and/or diagnosis of SARS-CoV-2 by FDA under an Emergency Use Authorization (EUA).  This EUA will remain in effect (meaning this test can be used) for the duration of  the COVID-19 declaration under Section 564(b)(1) of the A ct, 21 U.S.C. section 360bbb-3(b)(1), unless the authorization is terminated or revoked sooner.     Influenza A by PCR NEGATIVE NEGATIVE Final   Influenza B by PCR NEGATIVE NEGATIVE Final    Comment: (NOTE) The Xpert Xpress SARS-CoV-2/FLU/RSV plus assay is intended as an aid in the diagnosis of influenza from Nasopharyngeal swab specimens and should not be used as a sole basis for treatment. Nasal washings and aspirates are unacceptable for Xpert Xpress SARS-CoV-2/FLU/RSV testing.  Fact Sheet for Patients: BloggerCourse.com  Fact Sheet for Healthcare Providers: SeriousBroker.it  This test is not yet approved or cleared by the [place] FDA and has been authorized for detection and/or diagnosis of SARS-CoV-2 by FDA under an Emergency Use Authorization (EUA). This EUA will remain in effect (meaning this test can be used) for the duration of the COVID-19 declaration under Section 564(b)(1) of the Act, 21 U.S.C. section 360bbb-3(b)(1), unless the authorization is terminated or revoked.     Resp Syncytial Virus by PCR NEGATIVE NEGATIVE Final    Comment: (NOTE) Fact Sheet for Patients: BloggerCourse.com  Fact Sheet for  Healthcare Providers: SeriousBroker.it  This test is not yet approved or cleared by the [place] FDA and has been authorized for detection and/or diagnosis of SARS-CoV-2 by FDA under an Emergency Use Authorization (EUA). This EUA will remain in effect (meaning this test can be used) for the duration of the COVID-19 declaration under Section 564(b)(1) of the Act, 21 U.S.C. section 360bbb-3(b)(1), unless the authorization is terminated or revoked.  Performed at [facility], [phone]  [address] [place], [place] [address]   Culture, blood (x 2)     Status: Abnormal   Collection Time: 02/28/22 12:31 AM   Specimen: BLOOD  Result Value Ref Range Status   Specimen Description   Final    BLOOD BLOOD LEFT WRIST Performed at [facility], [address] [place], [place] [address]    Special Requests   Final    BOTTLES DRAWN AEROBIC AND ANAEROBIC Blood Culture results may not be optimal due to an inadequate volume of blood received in culture bottles Performed at [facility], [address] [place], [place] [address]    Culture  Setup Time   Final    GRAM NEGATIVE RODS IN BOTH AEROBIC AND ANAEROBIC BOTTLES CRITICAL RESULT CALLED TO, READ BACK BY AND VERIFIED WITH: ALEX CHAPPELL AT 8341 02/28/22.PMF Performed at [facility], [place] [address] [place], [place] [address]    Culture ESCHERICHIA COLI (A)  Final   Report Status 03/02/2022 FINAL  Final  Blood Culture ID Panel (Reflexed)     Status: Abnormal   Collection Time: 02/28/22 12:31 AM  Result Value Ref Range Status   Enterococcus faecalis NOT DETECTED NOT DETECTED Final   Enterococcus Faecium NOT DETECTED NOT DETECTED Final   Listeria monocytogenes NOT DETECTED NOT DETECTED Final   Staphylococcus species NOT DETECTED NOT DETECTED Final   Staphylococcus aureus (BCID) NOT DETECTED NOT DETECTED Final   Staphylococcus epidermidis NOT DETECTED NOT DETECTED Final    Staphylococcus lugdunensis NOT DETECTED NOT DETECTED Final   Streptococcus species NOT DETECTED NOT DETECTED Final   Streptococcus agalactiae NOT DETECTED NOT DETECTED Final   Streptococcus pneumoniae NOT DETECTED NOT DETECTED Final   Streptococcus pyogenes NOT DETECTED NOT DETECTED Final   A.calcoaceticus-baumannii NOT DETECTED NOT DETECTED Final   Bacteroides fragilis NOT DETECTED NOT DETECTED Final   Enterobacterales DETECTED (A) NOT DETECTED Final    Comment: Enterobacterales represent a large order of gram negative bacteria, not a single organism. CRITICAL RESULT CALLED TO, READ BACK BY AND VERIFIED WITH: ALEX CHAPPELL AT 9798 02/28/22.PMF    Enterobacter cloacae complex NOT DETECTED NOT DETECTED Final   Escherichia coli DETECTED (A) NOT DETECTED Final    Comment: CRITICAL RESULT CALLED TO, READ BACK BY AND VERIFIED WITH: ALEX CHAPPELL AT 9211 02/28/22.PMF    Klebsiella aerogenes NOT DETECTED NOT DETECTED Final   Klebsiella oxytoca NOT DETECTED NOT DETECTED Final   Klebsiella pneumoniae NOT DETECTED NOT DETECTED Final   Proteus species NOT DETECTED NOT DETECTED Final   Salmonella species NOT DETECTED NOT DETECTED Final   Serratia marcescens NOT DETECTED NOT DETECTED Final   Haemophilus influenzae NOT DETECTED NOT DETECTED Final   Neisseria meningitidis NOT DETECTED NOT DETECTED Final   Pseudomonas aeruginosa NOT DETECTED NOT DETECTED Final   Stenotrophomonas maltophilia NOT DETECTED NOT DETECTED Final   Candida albicans NOT DETECTED NOT DETECTED Final   Candida auris NOT DETECTED NOT DETECTED Final   Candida glabrata NOT DETECTED NOT DETECTED Final   Candida krusei NOT DETECTED NOT DETECTED Final   Candida parapsilosis NOT DETECTED NOT DETECTED Final   Candida tropicalis NOT DETECTED NOT DETECTED Final   Cryptococcus neoformans/gattii NOT DETECTED NOT DETECTED Final   CTX-M ESBL NOT DETECTED NOT DETECTED Final   Carbapenem resistance IMP NOT DETECTED NOT DETECTED Final    Carbapenem resistance KPC NOT DETECTED NOT DETECTED Final   Carbapenem resistance NDM NOT DETECTED NOT DETECTED Final   Carbapenem resist OXA 48 LIKE NOT DETECTED NOT  DETECTED Final   Carbapenem resistance VIM NOT DETECTED NOT DETECTED Final    Comment: Performed at [facility], [address] [place], [place] [address]  Culture, blood (x 2)     Status: Abnormal   Collection Time: 02/28/22  1:32 AM   Specimen: BLOOD  Result Value Ref Range Status   Specimen Description   Final    BLOOD BLOOD LEFT FOREARM Performed at [facility], [address] [place], [place] [address]    Special Requests   Final    BOTTLES DRAWN AEROBIC AND ANAEROBIC Blood Culture adequate volume Performed at [facility], [address] [place], [place] [address]    Culture  Setup Time   Final    GRAM NEGATIVE RODS IN BOTH AEROBIC AND ANAEROBIC BOTTLES CRITICAL RESULT CALLED TO, READ BACK BY AND VERIFIED WITH: ALEX CHAPPELL AT 1259 02/28/22.PMF Performed at [facility], [address] [place], [place] [address]    Culture ESCHERICHIA COLI (A)  Final   Report Status 03/02/2022 FINAL  Final   Organism ID, Bacteria ESCHERICHIA COLI  Final      Susceptibility   Escherichia coli - MIC*    AMPICILLIN >=32 RESISTANT Resistant     CEFAZOLIN <=4 SENSITIVE Sensitive     CEFEPIME <=0.12 SENSITIVE Sensitive     CEFTAZIDIME <=1 SENSITIVE Sensitive     CEFTRIAXONE <=0.25 SENSITIVE Sensitive     CIPROFLOXACIN <=0.25 SENSITIVE Sensitive     GENTAMICIN <=1 SENSITIVE Sensitive     IMIPENEM <=0.25 SENSITIVE Sensitive     TRIMETH/SULFA <=20 SENSITIVE Sensitive     AMPICILLIN/SULBACTAM >=32 RESISTANT Resistant     PIP/TAZO <=4 SENSITIVE Sensitive     * ESCHERICHIA COLI         Radiology Studies: CARDIAC CATHETERIZATION  Result Date: 03/04/2022   Prox RCA lesion is 30% stenosed.   Prox Cx to Mid Cx lesion is 40% stenosed.   Mid LAD lesion is 40% stenosed.   There is mild  left ventricular systolic dysfunction.   LV end diastolic pressure is normal.   The left ventricular ejection fraction is 45-50% by visual estimate. 1.  Moderate nonobstructive coronary artery disease. 2.  Mildly reduced LV systolic function. 3.  Right heart catheterization showed normal right and left-sided filling pressures, normal pulmonary pressure and normal cardiac output. RA pressure: 4 mmHg, RV: 32 /5 mmHg, PW: 7 mmHg, PA: 26/10 mmHg, cardiac output is 6.2 with a cardiac index of 3.34. Recommendations: The patient has nonischemic cardiomyopathy.  Recommend medical therapy for nonobstructive coronary artery disease.  Her hemodynamics are optimal and she is euvolemic.        Scheduled Meds:  aspirin  81 mg Oral Daily   dapagliflozin propanediol  10 mg Oral Daily   insulin aspart  0-15 Units Subcutaneous TID WC   insulin aspart  0-5 Units Subcutaneous QHS   potassium chloride  40 mEq Oral Daily   rosuvastatin  20 mg Oral Daily   sodium chloride flush  3 mL Intravenous Q12H   sodium chloride flush  3 mL Intravenous Q12H   spironolactone  12.5 mg Oral Daily   Continuous Infusions:  sodium chloride Stopped (02/28/22 1430)   sodium chloride      ceFAZolin (ANCEF) IV 2 g (03/04/22 1352)     LOS: 4 days      Tresa Moore, MD [facility]   If 7PM-7AM, please contact night-coverage  03/04/2022, 4:18 PM

## 2022-03-05 ENCOUNTER — Encounter: Payer: Self-pay | Admitting: Cardiovascular Disease

## 2022-03-05 DIAGNOSIS — I5043 Acute on chronic combined systolic (congestive) and diastolic (congestive) heart failure: Secondary | ICD-10-CM | POA: Diagnosis not present

## 2022-03-05 DIAGNOSIS — A419 Sepsis, unspecified organism: Secondary | ICD-10-CM | POA: Diagnosis not present

## 2022-03-05 DIAGNOSIS — R6521 Severe sepsis with septic shock: Secondary | ICD-10-CM | POA: Diagnosis not present

## 2022-03-05 LAB — CBC
HCT: 32.5 % — ABNORMAL LOW (ref 36.0–46.0)
Hemoglobin: 10.2 g/dL — ABNORMAL LOW (ref 12.0–15.0)
MCH: 28.3 pg (ref 26.0–34.0)
MCHC: 31.4 g/dL (ref 30.0–36.0)
MCV: 90 fL (ref 80.0–100.0)
Platelets: 145 10*3/uL — ABNORMAL LOW (ref 150–400)
RBC: 3.61 MIL/uL — ABNORMAL LOW (ref 3.87–5.11)
RDW: 17.3 % — ABNORMAL HIGH (ref 11.5–15.5)
WBC: 5.4 10*3/uL (ref 4.0–10.5)
nRBC: 0 % (ref 0.0–0.2)

## 2022-03-05 LAB — BASIC METABOLIC PANEL
Anion gap: 8 (ref 5–15)
BUN: 7 mg/dL — ABNORMAL LOW (ref 8–23)
CO2: 23 mmol/L (ref 22–32)
Calcium: 8.1 mg/dL — ABNORMAL LOW (ref 8.9–10.3)
Chloride: 103 mmol/L (ref 98–111)
Creatinine, Ser: 0.74 mg/dL (ref 0.44–1.00)
GFR, Estimated: >60 mL/min (ref >60)
Glucose, Bld: 133 mg/dL — ABNORMAL HIGH (ref 70–99)
Potassium: 4 mmol/L (ref 3.5–5.1)
Sodium: 134 mmol/L — ABNORMAL LOW (ref 135–145)

## 2022-03-05 LAB — GLUCOSE, CAPILLARY
Glucose-Capillary: 119 mg/dL — ABNORMAL HIGH (ref 70–99)
Glucose-Capillary: 122 mg/dL — ABNORMAL HIGH (ref 70–99)
Glucose-Capillary: 138 mg/dL — ABNORMAL HIGH (ref 70–99)
Glucose-Capillary: 150 mg/dL — ABNORMAL HIGH (ref 70–99)

## 2022-03-05 LAB — POCT I-STAT EG7
Acid-base deficit: 2 mmol/L (ref 0.0–2.0)
Bicarbonate: 23.4 mmol/L (ref 20.0–28.0)
Calcium, Ion: 1.15 mmol/L (ref 1.15–1.40)
HCT: 30 % — ABNORMAL LOW (ref 36.0–46.0)
Hemoglobin: 10.2 g/dL — ABNORMAL LOW (ref 12.0–15.0)
O2 Saturation: 69 %
Potassium: 4 mmol/L (ref 3.5–5.1)
Sodium: 135 mmol/L (ref 135–145)
TCO2: 25 mmol/L (ref 22–32)
pCO2, Ven: 40.9 mmHg — ABNORMAL LOW (ref 44–60)
pH, Ven: 7.365 (ref 7.25–7.43)
pO2, Ven: 37 mmHg (ref 32–45)

## 2022-03-05 MED ORDER — CIPROFLOXACIN HCL 500 MG PO TABS: 500.0000 mg | ORAL_TABLET | Freq: Two times a day (BID) | ORAL | 0 refills | Status: AC

## 2022-03-05 MED ORDER — POTASSIUM CHLORIDE CRYS ER 20 MEQ PO TBCR
40.0000 meq | EXTENDED_RELEASE_TABLET | Freq: Every day | ORAL | Status: DC
  Administered 2022-03-05: 40 meq via ORAL
  Filled 2022-03-05: qty 2

## 2022-03-05 MED ORDER — DAPAGLIFLOZIN PROPANEDIOL 10 MG PO TABS: 10.0000 mg | ORAL_TABLET | Freq: Every day | ORAL | 0 refills | Status: AC

## 2022-03-05 MED ORDER — SPIRONOLACTONE 25 MG PO TABS: 12.5000 mg | ORAL_TABLET | Freq: Every day | ORAL | 0 refills | Status: DC

## 2022-03-05 MED ORDER — SACUBITRIL-VALSARTAN 24-26 MG PO TABS
1.0000 | ORAL_TABLET | Freq: Two times a day (BID) | ORAL | Status: DC
  Administered 2022-03-05: 1 via ORAL
  Filled 2022-03-05: qty 1

## 2022-03-05 MED ORDER — SACUBITRIL-VALSARTAN 24-26 MG PO TABS: 1.0000 | ORAL_TABLET | Freq: Two times a day (BID) | ORAL | 0 refills | Status: AC

## 2022-03-05 MED ORDER — ASPIRIN 81 MG PO CHEW: 81.0000 mg | CHEWABLE_TABLET | Freq: Every day | ORAL | 0 refills | Status: AC

## 2022-03-05 MED ORDER — CIPROFLOXACIN HCL 500 MG PO TABS
500.0000 mg | ORAL_TABLET | Freq: Two times a day (BID) | ORAL | Status: DC
  Administered 2022-03-05: 500 mg via ORAL
  Filled 2022-03-05 (×2): qty 1

## 2022-03-05 NOTE — Care Management Important Message (Signed)
Important Message  Patient Details  Name: Erika Perry MRN: [id number] Date of Birth: 1950/11/23   Medicare Important Message Given:  Other (see comment)  Attempted to reach patient via room phone [phone]) to review Medicare IM due to isolation status.  Unable to reach at this time.     Dannette Barbara 03/05/2022, 1:32 PM

## 2022-03-05 NOTE — Discharge Summary (Signed)
Physician Discharge Summary  Erika Perry ZJI:[id number] DOB: May 09, 1950 DOA: 02/27/2022  PCP: Leone Haven, MD  Admit date: 02/27/2022 Discharge date: 03/05/2022  Admitted From: Home Disposition:  Home with [place]  Recommendations for Outpatient Follow-up:  Follow up with PCP in 1-2 weeks   Home Health:Yes Equipment/Devices:No   Discharge Condition:Stable  CODE STATUS:FULL  Diet recommendation: Reg  Brief/Interim Summary:  Erika Perry is a 72 y.o. female with medical history significant for hypertension, type II DM, obesity, vitamin D deficiency, who presented to the hospital because of confusion and dizziness from standing.  She also reported having chills shortly before admission.  She had been coughing for over a week.  Oxygen saturation was 87% on room air when EMS arrived.   She was hypotensive on admission.  Lactic acid was 6.7, procalcitonin was 34.62, sodium was 131, potassium 2.6, urinalysis was suggestive of UTI and COVID test was positive. She was admitted to the hospital for severe sepsis secondary to acute UTI.  She was treated with IV fluids and empiric IV antibiotics.  Hypotension worsened despite treatment with IV fluids.  She was started on IV Levophed infusion for septic shock.  Intensivist was consulted to assist with management. She required 2 L/min oxygen for acute hypoxic respiratory failure from severe sepsis.  Cardiologist was consulted for elevated troponins.   Patient underwent cardiac catheterization on 1/24.  Clean coronary arteries with optimize volume status.  Improving from sepsis standpoint.  Remained stable on intravenous Ancef.  Blood pressures have improved.  On day of discharge will transition to p.o. ciprofloxacin to complete 14-day antibiotic course.  Case discussed with heart failure service.  Cleared for discharge from their standpoint.  Cardiac medication sent via prescription at time of discharge.  Follow-up outpatient PCP and  cardiology.    Discharge Diagnoses:  Principal Problem:   Septic shock ([place]) Active Problems:   COVID-19   E coli bacteremia   Urinary tract infection without hematuria   Postural dizziness with presyncope   NSTEMI (non-ST elevated myocardial infarction) ([place])   Hypokalemia   Uncontrolled type 2 diabetes mellitus with hyperglycemia, without long-term current use of insulin (HCC)   Hypertension   Elevated troponin   Demand ischemia   Dilated cardiomyopathy (HCC)   Acute systolic CHF (congestive heart failure) (HCC)  Septic shock secondary to E. coli bacteremia and UTI:  Lactic acidosis has resolved.  Initially required vasopressor support.  That has been weaned off.  Blood pressure stable without pressor support.  Was on IV Ancef.  Transition to p.o. ciprofloxacin per sensitivities.  Complete 14 antibiotic course.   Type II NSTEMI, demand ischemia: Probably due to demand ischemia but unable to rule out ACS completely.  Patient underwent cardiac catheterization 1/24.  Clean coronary arteries with optimized volume status.   Acute systolic CHF: Hypotension limits the use of goal-directed medical therapy.  2D echo showed EF estimated at 35 to 17%, grade 1 diastolic dysfunction mild TR, mild MR, mild to moderate TR.     Acute hypoxic respiratory failure: Improved.  She is tolerating room air.  U     COVID-19 infection: Cough started over a week prior to admission so patient was not treated for COVID.Marland Kitchen  Remains COVID-positive on this admission.     Type II DM with hyperglycemia: Continue NovoLog as needed for hyperglycemia.  Hemoglobin A1c was 9.  Of note, hemoglobin A1c was 11.1 on 12/23/2021.     Hyperglycemia induced hyponatremia: Monitor BMP  Discharge Instructions  Discharge Instructions     Diet - low sodium heart healthy   Complete by: As directed    Increase activity slowly   Complete by: As directed       Allergies as of 03/05/2022       Reactions   Latex    Pt  states that she is allergic to latex in pre-op.  Regardless of allergy type latex will not be used.    Bactrim [sulfamethoxazole-trimethoprim]    Nausea, no appitite   Penicillins    Tolerated Cephalosporin Date: 07/11/2021.        Medication List     STOP taking these medications    hydrochlorothiazide 12.5 MG capsule Commonly known as: Microzide   losartan 50 MG tablet Commonly known as: COZAAR       TAKE these medications    aspirin 81 MG chewable tablet Chew 1 tablet (81 mg total) by mouth daily. Start taking on: March 06, 2022   ciprofloxacin 500 MG tablet Commonly known as: CIPRO Take 1 tablet (500 mg total) by mouth 2 (two) times daily for 9 days.   dapagliflozin propanediol 10 MG Tabs tablet Commonly known as: FARXIGA Take 1 tablet (10 mg total) by mouth daily. Start taking on: March 06, 2022   ketoconazole 2 % cream Commonly known as: NIZORAL Apply 1 Application topically daily.   metFORMIN 500 MG tablet Commonly known as: GLUCOPHAGE Take 1 tablet (500 mg total) by mouth 2 (two) times daily with a meal.   rosuvastatin 20 MG tablet Commonly known as: Crestor Take 1 tablet (20 mg total) by mouth daily.   sacubitril-valsartan 24-26 MG Commonly known as: ENTRESTO Take 1 tablet by mouth 2 (two) times daily.   spironolactone 25 MG tablet Commonly known as: ALDACTONE Take 0.5 tablets (12.5 mg total) by mouth daily. Start taking on: March 06, 2022        Allergies  Allergen Reactions   Latex     Pt states that she is allergic to latex in pre-op.  Regardless of allergy type latex will not be used.    Bactrim [Sulfamethoxazole-Trimethoprim]     Nausea, no appitite   Penicillins     Tolerated Cephalosporin Date: 07/11/2021.      Consultations: Cardiology   Procedures/Studies: CARDIAC CATHETERIZATION  Result Date: 03/04/2022   Prox RCA lesion is 30% stenosed.   Prox Cx to Mid Cx lesion is 40% stenosed.   Mid LAD lesion is 40% stenosed.    There is mild left ventricular systolic dysfunction.   LV end diastolic pressure is normal.   The left ventricular ejection fraction is 45-50% by visual estimate. 1.  Moderate nonobstructive coronary artery disease. 2.  Mildly reduced LV systolic function. 3.  Right heart catheterization showed normal right and left-sided filling pressures, normal pulmonary pressure and normal cardiac output. RA pressure: 4 mmHg, RV: 32 /5 mmHg, PW: 7 mmHg, PA: 26/10 mmHg, cardiac output is 6.2 with a cardiac index of 3.34. Recommendations: The patient has nonischemic cardiomyopathy.  Recommend medical therapy for nonobstructive coronary artery disease.  Her hemodynamics are optimal and she is euvolemic.   ECHOCARDIOGRAM COMPLETE  Result Date: 02/28/2022    ECHOCARDIOGRAM REPORT   Patient Name:   Erika Perry Date of Exam: 02/28/2022 Medical Rec #:  [id number]       Height:       62.0 in Accession #:    [id number]      Weight:       185.0 lb  Date of Birth:  [birth year]       BSA:          1.849 m Patient Age:    71 years        BP:           108/70 mmHg Patient Gender: F               HR:           115 bpm. Exam Location:  [facility] Procedure: 2D Echo, Color Doppler, Cardiac Doppler and Intracardiac            Opacification Agent Indications:     Syncope  History:         Patient has no prior history of Echocardiogram examinations.                  Risk Factors:Hypertension and HLD.  Sonographer:     L. Thornton-Maynard Referring Phys:  [id number] Andris Baumann Diagnosing Phys: Julien Nordmann MD  Sonographer Comments: Suboptimal apical window. IMPRESSIONS  1. Left ventricular ejection fraction, by estimation, is 35 to 40%. Left ventricular ejection fraction by 2D MOD biplane is 41.3 %. The left ventricle has moderately decreased function. The left ventricle demonstrates global hypokinesis. Left ventricular diastolic parameters are consistent with Grade I diastolic dysfunction (impaired relaxation).  2. Right ventricular systolic  function is normal. The right ventricular size is normal. There is normal pulmonary artery systolic pressure. The estimated right ventricular systolic pressure is 33.5 mmHg.  3. The mitral valve is normal in structure. Mild mitral valve regurgitation. No evidence of mitral stenosis.  4. Tricuspid valve regurgitation is mild to moderate.  5. The aortic valve is tricuspid. Aortic valve regurgitation is not visualized. No aortic stenosis is present.  6. The inferior vena cava is normal in size with greater than 50% respiratory variability, suggesting right atrial pressure of 3 mmHg. FINDINGS  Left Ventricle: Left ventricular ejection fraction, by estimation, is 35 to 40%. Left ventricular ejection fraction by 2D MOD biplane is 41.3 %. The left ventricle has moderately decreased function. The left ventricle demonstrates global hypokinesis. Definity contrast agent was given IV to delineate the left ventricular endocardial borders. The left ventricular internal cavity size was normal in size. There is no left ventricular hypertrophy. Left ventricular diastolic parameters are consistent with Grade I diastolic dysfunction (impaired relaxation). Right Ventricle: The right ventricular size is normal. No increase in right ventricular wall thickness. Right ventricular systolic function is normal. There is normal pulmonary artery systolic pressure. The tricuspid regurgitant velocity is 2.76 m/s, and  with an assumed right atrial pressure of 3 mmHg, the estimated right ventricular systolic pressure is 33.5 mmHg. Left Atrium: Left atrial size was normal in size. Right Atrium: Right atrial size was normal in size. Pericardium: There is no evidence of pericardial effusion. Mitral Valve: The mitral valve is normal in structure. Mild mitral valve regurgitation. No evidence of mitral valve stenosis. MV peak gradient, 8.4 mmHg. The mean mitral valve gradient is 5.0 mmHg. Tricuspid Valve: The tricuspid valve is normal in structure.  Tricuspid valve regurgitation is mild to moderate. No evidence of tricuspid stenosis. Aortic Valve: The aortic valve is tricuspid. Aortic valve regurgitation is not visualized. No aortic stenosis is present. Aortic valve mean gradient measures 5.5 mmHg. Aortic valve peak gradient measures 9.5 mmHg. Aortic valve area, by VTI measures 1.83 cm. Pulmonic Valve: The pulmonic valve was normal in structure. Pulmonic valve regurgitation is not visualized. No evidence of pulmonic  stenosis. Aorta: The aortic root is normal in size and structure. Venous: The inferior vena cava is normal in size with greater than 50% respiratory variability, suggesting right atrial pressure of 3 mmHg. IAS/Shunts: No atrial level shunt detected by color flow Doppler.  LEFT VENTRICLE PLAX 2D                        Biplane EF (MOD) LVIDd:         4.90 cm         LV Biplane EF:   Left LVIDs:         4.30 cm                          ventricular LV PW:         0.90 cm                          ejection LV IVS:        1.00 cm                          fraction by LVOT diam:     1.90 cm                          2D MOD LV SV:         46                               biplane is LV SV Index:   25                               41.3 %. LVOT Area:     2.84 cm                                Diastology                                LV e' medial:    6.31 cm/s LV Volumes (MOD)               LV E/e' medial:  17.6 LV vol d, MOD    65.6 ml       LV e' lateral:   9.79 cm/s A2C:                           LV E/e' lateral: 11.3 LV vol d, MOD    72.5 ml A4C: LV vol s, MOD    36.3 ml A2C: LV vol s, MOD    46.9 ml A4C: LV SV MOD A2C:   29.3 ml LV SV MOD A4C:   72.5 ml LV SV MOD BP:    29.7 ml RIGHT VENTRICLE RV Basal diam:  3.30 cm RV S prime:     14.90 cm/s TAPSE (M-mode): 1.8 cm LEFT ATRIUM             Index        RIGHT ATRIUM           Index LA diam:  3.40 cm 1.84 cm/m   RA Area:     14.90 cm LA Vol (A2C):   54.7 ml 29.58 ml/m  RA Volume:   39.80 ml  21.52  ml/m LA Vol (A4C):   54.1 ml 29.25 ml/m LA Biplane Vol: 54.2 ml 29.31 ml/m  AORTIC VALVE                     PULMONIC VALVE AV Area (Vmax):    1.88 cm      PV Vmax:       1.04 m/s AV Area (Vmean):   1.79 cm      PV Peak grad:  4.3 mmHg AV Area (VTI):     1.83 cm AV Vmax:           154.00 cm/s AV Vmean:          113.500 cm/s AV VTI:            0.250 m AV Peak Grad:      9.5 mmHg AV Mean Grad:      5.5 mmHg LVOT Vmax:         102.00 cm/s LVOT Vmean:        71.800 cm/s LVOT VTI:          0.161 m LVOT/AV VTI ratio: 0.65  AORTA Ao Root diam: 2.90 cm Ao Asc diam:  3.20 cm MITRAL VALVE                  TRICUSPID VALVE MV Area (PHT): 5.88 cm       TR Peak grad:   30.5 mmHg MV Area VTI:   2.15 cm       TR Vmax:        276.00 cm/s MV Peak grad:  8.4 mmHg MV Mean grad:  5.0 mmHg       SHUNTS MV Vmax:       1.45 m/s       Systemic VTI:  0.16 m MV Vmean:      103.0 cm/s     Systemic Diam: 1.90 cm MV Decel Time: 129 msec MR Peak grad:    56.9 mmHg MR Mean grad:    40.0 mmHg MR Vmax:         377.00 cm/s MR Vmean:        301.0 cm/s MR PISA:         1.01 cm MR PISA Eff ROA: 9 mm MR PISA Radius:  0.40 cm MV E velocity: 111.00 cm/s MV A velocity: 128.00 cm/s MV E/A ratio:  0.87 Julien Nordmann MD Electronically signed by Julien Nordmann MD Signature Date/Time: 02/28/2022/2:37:13 PM    Final    CT Angio Chest Pulmonary Embolism (PE) W or WO Contrast  Result Date: 02/28/2022 CLINICAL DATA:  Syncope EXAM: CT ANGIOGRAPHY CHEST WITH CONTRAST TECHNIQUE: Multidetector CT imaging of the chest was performed using the standard protocol during bolus administration of intravenous contrast. Multiplanar CT image reconstructions and MIPs were obtained to evaluate the vascular anatomy. RADIATION DOSE REDUCTION: This exam was performed according to the departmental dose-optimization program which includes automated exposure control, adjustment of the mA and/or kV according to patient size and/or use of iterative reconstruction technique.  CONTRAST:  75mL OMNIPAQUE IOHEXOL 350 MG/ML SOLN COMPARISON:  None Available. FINDINGS: Cardiovascular: There is adequate opacification of the pulmonary arterial tree. No intraluminal filling defect identified to suggest acute pulmonary embolism. The central pulmonary arteries are of normal caliber. Moderate coronary artery calcification within  the left anterior descending coronary artery. Global cardiac size within normal limits. No pericardial effusion. Mild atherosclerotic calcification within the thoracic aorta. No aortic aneurysm. Mediastinum/Nodes: No enlarged mediastinal, hilar, or axillary lymph nodes. Thyroid gland, trachea, and esophagus demonstrate no significant findings. Lungs/Pleura: Scattered bilateral ground-glass opacity likely relates to atelectasis due to expiratory phase imaging. No definite superimposed focal pulmonary nodule or infiltrate. No pneumothorax or pleural effusion. No central obstructing lesion. Upper Abdomen: No acute abnormality. Musculoskeletal: No chest wall abnormality. No acute or significant osseous findings. Review of the MIP images confirms the above findings. IMPRESSION: 1. No pulmonary embolism. 2. Moderate coronary artery calcification within the left anterior descending coronary artery. 3. Scattered bilateral ground-glass opacity likely relates to atelectasis due to expiratory phase imaging. No definite superimposed focal pulmonary nodule or infiltrate. Aortic Atherosclerosis (ICD10-I70.0). Electronically Signed   By: Helyn NumbersAshesh  Parikh M.D.   On: 02/28/2022 02:13   DG Chest Portable 1 View  Result Date: 02/27/2022 CLINICAL DATA:  Shortness of breath. EXAM: PORTABLE CHEST 1 VIEW COMPARISON:  None Available. FINDINGS: No focal consolidation, pleural effusion, or pneumothorax. Faint focal area of increased density in the right suprahilar region measuring approximately 1.7 x 1.9 cm, likely interstitial or vascular crowding. CT may provide better evaluation on a  nonemergent/outpatient basis. The cardiac silhouette is within normal limits. No acute osseous pathology. IMPRESSION: 1. No acute cardiopulmonary process. 2. Faint focal area of increased density in the right suprahilar region. Follow-up with chest radiograph or CT on a nonemergent/outpatient basis recommended. Electronically Signed   By: Elgie CollardArash  Radparvar M.D.   On: 02/27/2022 21:53      Subjective: Seen and examined on the day of discharge.  Stable no distress.  Back to baseline.  Appropriate for discharge home.  Discharge Exam: Vitals:   03/05/22 0836 03/05/22 1147  BP: 138/78 138/80  Pulse: 92 87  Resp: 18 18  Temp: 98.1 F (36.7 C) (!) 97.5 F (36.4 C)  SpO2: 96% 96%   Vitals:   03/05/22 0550 03/05/22 0836 03/05/22 1036 03/05/22 1147  BP: 115/66 138/78  138/80  Pulse: 82 92  87  Resp: 17 18  18   Temp: 98.2 F (36.8 C) 98.1 F (36.7 C)  (!) 97.5 F (36.4 C)  TempSrc: Oral Oral    SpO2: 95% 96%  96%  Weight:   82.1 kg   Height:        General: Pt is alert, awake, not in acute distress Cardiovascular: RRR, S1/S2 +, no rubs, no gallops Respiratory: CTA bilaterally, no wheezing, no rhonchi Abdominal: Soft, NT, ND, bowel sounds + Extremities: no edema, no cyanosis    The results of significant diagnostics from this hospitalization (including imaging, microbiology, ancillary and laboratory) are listed below for reference.     Microbiology: Recent Results (from the past 240 hour(s))  Resp panel by RT-PCR (RSV, Flu A&B, Covid) Anterior Nasal Swab     Status: Abnormal   Collection Time: 02/27/22  9:08 PM   Specimen: Anterior Nasal Swab  Result Value Ref Range Status   SARS Coronavirus 2 by RT PCR POSITIVE (A) NEGATIVE Final    Comment: (NOTE) SARS-CoV-2 target nucleic acids are DETECTED.  The SARS-CoV-2 RNA is generally detectable in upper respiratory specimens during the acute phase of infection. Positive results are indicative of the presence of the identified  virus, but do not rule out bacterial infection or co-infection with other pathogens not detected by the test. Clinical correlation with patient history and other diagnostic information  is necessary to determine patient infection status. The expected result is Negative.  Fact Sheet for Patients: BloggerCourse.com  Fact Sheet for Healthcare Providers: SeriousBroker.it  This test is not yet approved or cleared by the [place] FDA and  has been authorized for detection and/or diagnosis of SARS-CoV-2 by FDA under an Emergency Use Authorization (EUA).  This EUA will remain in effect (meaning this test can be used) for the duration of  the COVID-19 declaration under Section 564(b)(1) of the A ct, 21 U.S.C. section 360bbb-3(b)(1), unless the authorization is terminated or revoked sooner.     Influenza A by PCR NEGATIVE NEGATIVE Final   Influenza B by PCR NEGATIVE NEGATIVE Final    Comment: (NOTE) The Xpert Xpress SARS-CoV-2/FLU/RSV plus assay is intended as an aid in the diagnosis of influenza from Nasopharyngeal swab specimens and should not be used as a sole basis for treatment. Nasal washings and aspirates are unacceptable for Xpert Xpress SARS-CoV-2/FLU/RSV testing.  Fact Sheet for Patients: BloggerCourse.com  Fact Sheet for Healthcare Providers: SeriousBroker.it  This test is not yet approved or cleared by the [place] FDA and has been authorized for detection and/or diagnosis of SARS-CoV-2 by FDA under an Emergency Use Authorization (EUA). This EUA will remain in effect (meaning this test can be used) for the duration of the COVID-19 declaration under Section 564(b)(1) of the Act, 21 U.S.C. section 360bbb-3(b)(1), unless the authorization is terminated or revoked.     Resp Syncytial Virus by PCR NEGATIVE NEGATIVE Final    Comment: (NOTE) Fact Sheet for  Patients: BloggerCourse.com  Fact Sheet for Healthcare Providers: SeriousBroker.it  This test is not yet approved or cleared by the [place] FDA and has been authorized for detection and/or diagnosis of SARS-CoV-2 by FDA under an Emergency Use Authorization (EUA). This EUA will remain in effect (meaning this test can be used) for the duration of the COVID-19 declaration under Section 564(b)(1) of the Act, 21 U.S.C. section 360bbb-3(b)(1), unless the authorization is terminated or revoked.  Performed at [facility], [address] [place], [place] [address]   Culture, blood (x 2)     Status: Abnormal   Collection Time: 02/28/22 12:31 AM   Specimen: BLOOD  Result Value Ref Range Status   Specimen Description   Final    BLOOD BLOOD LEFT WRIST Performed at [facility], [address] [place], [place] [address]    Special Requests   Final    BOTTLES DRAWN AEROBIC AND ANAEROBIC Blood Culture results may not be optimal due to an inadequate volume of blood received in culture bottles Performed at [facility], [address] [place], [place] [address]    Culture  Setup Time   Final    GRAM NEGATIVE RODS IN BOTH AEROBIC AND ANAEROBIC BOTTLES CRITICAL RESULT CALLED TO, READ BACK BY AND VERIFIED WITH: ALEX CHAPPELL AT 1259 02/28/22.PMF Performed at [facility], [address], [place], [place] [address]    Culture ESCHERICHIA COLI (A)  Final   Report Status 03/02/2022 FINAL  Final  Blood Culture ID Panel (Reflexed)     Status: Abnormal   Collection Time: 02/28/22 12:31 AM  Result Value Ref Range Status   Enterococcus faecalis NOT DETECTED NOT DETECTED Final   Enterococcus Faecium NOT DETECTED NOT DETECTED Final   Listeria monocytogenes NOT DETECTED NOT DETECTED Final   Staphylococcus species NOT DETECTED NOT DETECTED Final   Staphylococcus aureus (BCID) NOT DETECTED NOT DETECTED Final    Staphylococcus  epidermidis NOT DETECTED NOT DETECTED Final   Staphylococcus lugdunensis NOT DETECTED NOT DETECTED Final   Streptococcus species NOT DETECTED NOT DETECTED Final   Streptococcus agalactiae NOT DETECTED NOT DETECTED Final   Streptococcus pneumoniae NOT DETECTED NOT DETECTED Final   Streptococcus pyogenes NOT DETECTED NOT DETECTED Final   A.calcoaceticus-baumannii NOT DETECTED NOT DETECTED Final   Bacteroides fragilis NOT DETECTED NOT DETECTED Final   Enterobacterales DETECTED (A) NOT DETECTED Final    Comment: Enterobacterales represent a large order of gram negative bacteria, not a single organism. CRITICAL RESULT CALLED TO, READ BACK BY AND VERIFIED WITH: ALEX CHAPPELL AT 1259 02/28/22.PMF    Enterobacter cloacae complex NOT DETECTED NOT DETECTED Final   Escherichia coli DETECTED (A) NOT DETECTED Final    Comment: CRITICAL RESULT CALLED TO, READ BACK BY AND VERIFIED WITH: ALEX CHAPPELL AT 1259 02/28/22.PMF    Klebsiella aerogenes NOT DETECTED NOT DETECTED Final   Klebsiella oxytoca NOT DETECTED NOT DETECTED Final   Klebsiella pneumoniae NOT DETECTED NOT DETECTED Final   Proteus species NOT DETECTED NOT DETECTED Final   Salmonella species NOT DETECTED NOT DETECTED Final   Serratia marcescens NOT DETECTED NOT DETECTED Final   Haemophilus influenzae NOT DETECTED NOT DETECTED Final   Neisseria meningitidis NOT DETECTED NOT DETECTED Final   Pseudomonas aeruginosa NOT DETECTED NOT DETECTED Final   Stenotrophomonas maltophilia NOT DETECTED NOT DETECTED Final   Candida albicans NOT DETECTED NOT DETECTED Final   Candida auris NOT DETECTED NOT DETECTED Final   Candida glabrata NOT DETECTED NOT DETECTED Final   Candida krusei NOT DETECTED NOT DETECTED Final   Candida parapsilosis NOT DETECTED NOT DETECTED Final   Candida tropicalis NOT DETECTED NOT DETECTED Final   Cryptococcus neoformans/gattii NOT DETECTED NOT DETECTED Final   CTX-M ESBL NOT DETECTED NOT DETECTED Final    Carbapenem resistance IMP NOT DETECTED NOT DETECTED Final   Carbapenem resistance KPC NOT DETECTED NOT DETECTED Final   Carbapenem resistance NDM NOT DETECTED NOT DETECTED Final   Carbapenem resist OXA 48 LIKE NOT DETECTED NOT DETECTED Final   Carbapenem resistance VIM NOT DETECTED NOT DETECTED Final    Comment: Performed at [facility], [address] [place], [place] [address]  Culture, blood (x 2)     Status: Abnormal   Collection Time: 02/28/22  1:32 AM   Specimen: BLOOD  Result Value Ref Range Status   Specimen Description   Final    BLOOD BLOOD LEFT FOREARM Performed at [facility], [address], [place], [place] [address]    Special Requests   Final    BOTTLES DRAWN AEROBIC AND ANAEROBIC Blood Culture adequate volume Performed at [facility], [address] [place], [place] [address]    Culture  Setup Time   Final    GRAM NEGATIVE RODS IN BOTH AEROBIC AND ANAEROBIC BOTTLES CRITICAL RESULT CALLED TO, READ BACK BY AND VERIFIED WITH: ALEX CHAPPELL AT 1259 02/28/22.PMF Performed at [facility], [address] [place], [place] [address]    Culture ESCHERICHIA COLI (A)  Final   Report Status 03/02/2022 FINAL  Final   Organism ID, Bacteria ESCHERICHIA COLI  Final      Susceptibility   Escherichia coli - MIC*    AMPICILLIN >=32 RESISTANT Resistant     CEFAZOLIN <=4 SENSITIVE Sensitive     CEFEPIME <=0.12 SENSITIVE Sensitive     CEFTAZIDIME <=1 SENSITIVE Sensitive     CEFTRIAXONE <=0.25 SENSITIVE Sensitive     CIPROFLOXACIN <=0.25 SENSITIVE Sensitive  GENTAMICIN <=1 SENSITIVE Sensitive     IMIPENEM <=0.25 SENSITIVE Sensitive     TRIMETH/SULFA <=20 SENSITIVE Sensitive     AMPICILLIN/SULBACTAM >=32 RESISTANT Resistant     PIP/TAZO <=4 SENSITIVE Sensitive     * ESCHERICHIA COLI  Urine Culture     Status: None   Collection Time: 03/03/22  7:44 PM   Specimen: Urine, Clean Catch  Result Value Ref Range Status   Specimen  Description   Final    URINE, CLEAN CATCH Performed at [facility], [address] [place], [place] [address]    Special Requests   Final    NONE Performed at [facility], [address] [place], [place] [address]    Culture   Final    NO GROWTH Performed at [facility], [address] [place], [place] [address]    Report Status 03/04/2022 FINAL  Final     Labs: BNP (last 3 results) No results for input(s): "BNP" in the last 8760 hours. Basic Metabolic Panel: Recent Labs  Lab 02/27/22 1811 02/28/22 0132 03/01/22 0734 03/02/22 0506 03/03/22 0349 03/04/22 0501 03/04/22 1111 03/05/22 0923  NA 131*   < > 133* 134* 132* 133* 135 134*  K 2.6*   < > 3.5 3.4* 3.3* 3.7 3.9 4.0  CL 94*   < > 98 99 98 103  --  103  CO2 24   < > 28 28 26 24   --  23  GLUCOSE 338*   < > 247* 148* 147* 136*  --  133*  BUN 16   < > 21 14 11 8   --  7*  CREATININE 0.89   < > 0.80 0.77 0.67 0.65  --  0.74  CALCIUM 8.5*   < > 7.6* 7.8* 8.1* 7.9*  --  8.1*  MG 1.7  --   --  1.8 1.8  --   --   --   PHOS  --   --   --  2.2*  --   --   --   --    < > = values in this interval not displayed.   Liver Function Tests: Recent Labs  Lab 03/01/22 0734  AST 23  ALT 15  ALKPHOS 57  BILITOT 1.7*  PROT 5.9*  ALBUMIN 2.5*   No results for input(s): "LIPASE", "AMYLASE" in the last 168 hours. No results for input(s): "AMMONIA" in the last 168 hours. CBC: Recent Labs  Lab 02/28/22 0733 03/02/22 0506 03/03/22 0349 03/04/22 0501 03/04/22 1111 03/05/22 0509  WBC 6.8 5.5 3.8* 4.3  --  5.4  NEUTROABS  --  3.5 2.3  --   --   --   HGB 10.0* 9.7* 10.1* 10.1* 10.2* 10.2*  HCT 31.3* 31.1* 31.5* 31.5* 30.0* 32.5*  MCV 90.5 90.9 89.2 88.5  --  90.0  PLT 125* 125* 105* 121*  --  145*   Cardiac Enzymes: No results for input(s): "CKTOTAL", "CKMB", "CKMBINDEX", "TROPONINI" in the last 168 hours. BNP: Invalid input(s): "POCBNP" CBG: Recent Labs  Lab 03/04/22 2019 03/05/22 0040  03/05/22 0427 03/05/22 0837 03/05/22 1148  GLUCAP 142* 122* 119* 138* 150*   D-Dimer No results for input(s): "DDIMER" in the last 72 hours. Hgb A1c No results for input(s): "HGBA1C" in the last 72 hours. Lipid Profile No results for input(s): "CHOL", "HDL", "LDLCALC", "TRIG", "CHOLHDL", "LDLDIRECT" in the last 72 hours. Thyroid function studies No results for input(s): "TSH", "T4TOTAL", "T3FREE", "THYROIDAB" in the last  72 hours.  Invalid input(s): "FREET3" Anemia work up No results for input(s): "VITAMINB12", "FOLATE", "FERRITIN", "TIBC", "IRON", "RETICCTPCT" in the last 72 hours. Urinalysis    Component Value Date/Time   COLORURINE YELLOW (A) 02/27/2022 2203   APPEARANCEUR TURBID (A) 02/27/2022 2203   LABSPEC 1.007 02/27/2022 2203   PHURINE 5.0 02/27/2022 2203   GLUCOSEU 50 (A) 02/27/2022 2203   HGBUR LARGE (A) 02/27/2022 2203   BILIRUBINUR NEGATIVE 02/27/2022 2203   BILIRUBINUR small 04/16/2020 0932   KETONESUR NEGATIVE 02/27/2022 2203   PROTEINUR >=300 (A) 02/27/2022 2203   UROBILINOGEN 0.2 04/16/2020 0932   NITRITE NEGATIVE 02/27/2022 2203   LEUKOCYTESUR LARGE (A) 02/27/2022 2203   Sepsis Labs Recent Labs  Lab 03/02/22 0506 03/03/22 0349 03/04/22 0501 03/05/22 0509  WBC 5.5 3.8* 4.3 5.4   Microbiology Recent Results (from the past 240 hour(s))  Resp panel by RT-PCR (RSV, Flu A&B, Covid) Anterior Nasal Swab     Status: Abnormal   Collection Time: 02/27/22  9:08 PM   Specimen: Anterior Nasal Swab  Result Value Ref Range Status   SARS Coronavirus 2 by RT PCR POSITIVE (A) NEGATIVE Final    Comment: (NOTE) SARS-CoV-2 target nucleic acids are DETECTED.  The SARS-CoV-2 RNA is generally detectable in upper respiratory specimens during the acute phase of infection. Positive results are indicative of the presence of the identified virus, but do not rule out bacterial infection or co-infection with other pathogens not detected by the test. Clinical correlation  with patient history and other diagnostic information is necessary to determine patient infection status. The expected result is Negative.  Fact Sheet for Patients: BloggerCourse.com  Fact Sheet for Healthcare Providers: SeriousBroker.it  This test is not yet approved or cleared by the [place] FDA and  has been authorized for detection and/or diagnosis of SARS-CoV-2 by FDA under an Emergency Use Authorization (EUA).  This EUA will remain in effect (meaning this test can be used) for the duration of  the COVID-19 declaration under Section 564(b)(1) of the A ct, 21 U.S.C. section 360bbb-3(b)(1), unless the authorization is terminated or revoked sooner.     Influenza A by PCR NEGATIVE NEGATIVE Final   Influenza B by PCR NEGATIVE NEGATIVE Final    Comment: (NOTE) The Xpert Xpress SARS-CoV-2/FLU/RSV plus assay is intended as an aid in the diagnosis of influenza from Nasopharyngeal swab specimens and should not be used as a sole basis for treatment. Nasal washings and aspirates are unacceptable for Xpert Xpress SARS-CoV-2/FLU/RSV testing.  Fact Sheet for Patients: BloggerCourse.com  Fact Sheet for Healthcare Providers: SeriousBroker.it  This test is not yet approved or cleared by the [place] FDA and has been authorized for detection and/or diagnosis of SARS-CoV-2 by FDA under an Emergency Use Authorization (EUA). This EUA will remain in effect (meaning this test can be used) for the duration of the COVID-19 declaration under Section 564(b)(1) of the Act, 21 U.S.C. section 360bbb-3(b)(1), unless the authorization is terminated or revoked.     Resp Syncytial Virus by PCR NEGATIVE NEGATIVE Final    Comment: (NOTE) Fact Sheet for Patients: BloggerCourse.com  Fact Sheet for Healthcare Providers: SeriousBroker.it  This  test is not yet approved or cleared by the [place] FDA and has been authorized for detection and/or diagnosis of SARS-CoV-2 by FDA under an Emergency Use Authorization (EUA). This EUA will remain in effect (meaning this test can be used) for the duration of the COVID-19 declaration under Section 564(b)(1) of the Act, 21 U.S.C. section 360bbb-3(b)(1),  unless the authorization is terminated or revoked.  Performed at [facility], [address] [place], [place] [address]   Culture, blood (x 2)     Status: Abnormal   Collection Time: 02/28/22 12:31 AM   Specimen: BLOOD  Result Value Ref Range Status   Specimen Description   Final    BLOOD BLOOD LEFT WRIST Performed at [facility], [address] [place], [place] [address]    Special Requests   Final    BOTTLES DRAWN AEROBIC AND ANAEROBIC Blood Culture results may not be optimal due to an inadequate volume of blood received in culture bottles Performed at [facility], [address], [place], [place] [address]    Culture  Setup Time   Final    GRAM NEGATIVE RODS IN BOTH AEROBIC AND ANAEROBIC BOTTLES CRITICAL RESULT CALLED TO, READ BACK BY AND VERIFIED WITH: ALEX CHAPPELL AT 1259 02/28/22.PMF Performed at [facility], [address] [place], [place] [address]    Culture ESCHERICHIA COLI (A)  Final   Report Status 03/02/2022 FINAL  Final  Blood Culture ID Panel (Reflexed)     Status: Abnormal   Collection Time: 02/28/22 12:31 AM  Result Value Ref Range Status   Enterococcus faecalis NOT DETECTED NOT DETECTED Final   Enterococcus Faecium NOT DETECTED NOT DETECTED Final   Listeria monocytogenes NOT DETECTED NOT DETECTED Final   Staphylococcus species NOT DETECTED NOT DETECTED Final   Staphylococcus aureus (BCID) NOT DETECTED NOT DETECTED Final   Staphylococcus epidermidis NOT DETECTED NOT DETECTED Final   Staphylococcus lugdunensis NOT DETECTED NOT DETECTED Final   Streptococcus species  NOT DETECTED NOT DETECTED Final   Streptococcus agalactiae NOT DETECTED NOT DETECTED Final   Streptococcus pneumoniae NOT DETECTED NOT DETECTED Final   Streptococcus pyogenes NOT DETECTED NOT DETECTED Final   A.calcoaceticus-baumannii NOT DETECTED NOT DETECTED Final   Bacteroides fragilis NOT DETECTED NOT DETECTED Final   Enterobacterales DETECTED (A) NOT DETECTED Final    Comment: Enterobacterales represent a large order of gram negative bacteria, not a single organism. CRITICAL RESULT CALLED TO, READ BACK BY AND VERIFIED WITH: ALEX CHAPPELL AT 1259 02/28/22.PMF    Enterobacter cloacae complex NOT DETECTED NOT DETECTED Final   Escherichia coli DETECTED (A) NOT DETECTED Final    Comment: CRITICAL RESULT CALLED TO, READ BACK BY AND VERIFIED WITH: ALEX CHAPPELL AT 1259 02/28/22.PMF    Klebsiella aerogenes NOT DETECTED NOT DETECTED Final   Klebsiella oxytoca NOT DETECTED NOT DETECTED Final   Klebsiella pneumoniae NOT DETECTED NOT DETECTED Final   Proteus species NOT DETECTED NOT DETECTED Final   Salmonella species NOT DETECTED NOT DETECTED Final   Serratia marcescens NOT DETECTED NOT DETECTED Final   Haemophilus influenzae NOT DETECTED NOT DETECTED Final   Neisseria meningitidis NOT DETECTED NOT DETECTED Final   Pseudomonas aeruginosa NOT DETECTED NOT DETECTED Final   Stenotrophomonas maltophilia NOT DETECTED NOT DETECTED Final   Candida albicans NOT DETECTED NOT DETECTED Final   Candida auris NOT DETECTED NOT DETECTED Final   Candida glabrata NOT DETECTED NOT DETECTED Final   Candida krusei NOT DETECTED NOT DETECTED Final   Candida parapsilosis NOT DETECTED NOT DETECTED Final   Candida tropicalis NOT DETECTED NOT DETECTED Final   Cryptococcus neoformans/gattii NOT DETECTED NOT DETECTED Final   CTX-M ESBL NOT DETECTED NOT DETECTED Final   Carbapenem resistance IMP NOT DETECTED NOT DETECTED Final   Carbapenem resistance KPC NOT DETECTED NOT DETECTED Final   Carbapenem resistance NDM NOT  DETECTED NOT DETECTED Final   Carbapenem resist OXA 48 LIKE NOT DETECTED NOT DETECTED Final   Carbapenem resistance VIM NOT DETECTED NOT DETECTED Final    Comment: Performed at [facility], [address] [place], [place] [address]  Culture, blood (x 2)     Status: Abnormal   Collection Time: 02/28/22  1:32 AM   Specimen: BLOOD  Result Value Ref Range Status   Specimen Description   Final    BLOOD BLOOD LEFT FOREARM Performed at [facility], [address] [place], [place] [address]    Special Requests   Final    BOTTLES DRAWN AEROBIC AND ANAEROBIC Blood Culture adequate volume Performed at [facility], [address] [place], [place] [address]    Culture  Setup Time   Final    GRAM NEGATIVE RODS IN BOTH AEROBIC AND ANAEROBIC BOTTLES CRITICAL RESULT CALLED TO, READ BACK BY AND VERIFIED WITH: ALEX CHAPPELL AT 1259 02/28/22.PMF Performed at [facility], [address] [place], [place] [address]    Culture ESCHERICHIA COLI (A)  Final   Report Status 03/02/2022 FINAL  Final   Organism ID, Bacteria ESCHERICHIA COLI  Final      Susceptibility   Escherichia coli - MIC*    AMPICILLIN >=32 RESISTANT Resistant     CEFAZOLIN <=4 SENSITIVE Sensitive     CEFEPIME <=0.12 SENSITIVE Sensitive     CEFTAZIDIME <=1 SENSITIVE Sensitive     CEFTRIAXONE <=0.25 SENSITIVE Sensitive     CIPROFLOXACIN <=0.25 SENSITIVE Sensitive     GENTAMICIN <=1 SENSITIVE Sensitive     IMIPENEM <=0.25 SENSITIVE Sensitive     TRIMETH/SULFA <=20 SENSITIVE Sensitive     AMPICILLIN/SULBACTAM >=32 RESISTANT Resistant     PIP/TAZO <=4 SENSITIVE Sensitive     * ESCHERICHIA COLI  Urine Culture     Status: None   Collection Time: 03/03/22  7:44 PM   Specimen: Urine, Clean Catch  Result Value Ref Range Status   Specimen Description   Final    URINE, CLEAN CATCH Performed at [facility], [address] [place], [place] [address]    Special Requests   Final     NONE Performed at [facility], [address] [place], [place] [address]    Culture   Final    NO GROWTH Performed at [facility], [address] [place], [place] [address]    Report Status 03/04/2022 FINAL  Final     Time coordinating discharge: Over 30 minutes  SIGNED:   Tresa MooreSudheer B Alleta Avery, MD  [facility] 03/05/2022, 1:42 PM Pager   If 7PM-7AM, please contact night-coverage

## 2022-03-05 NOTE — Evaluation (Signed)
Physical Therapy Evaluation Patient Details Name: Erika Perry MRN: [id number] DOB: 1950/06/23 Today's Date: 03/05/2022  History of Present Illness  Erika Perry is a 72 y.o. female with medical history significant for DM, HTN who presents to the ED for evaluation of an episode of dizziness and confusion at home the episode happened on standing.  Patient states she had been coughing for a week.  And earlier in the day she had experienced some chills but had no fever.  She denies  shortness of breath and denies chest pain, vomiting, diarrhea or abdominal pain or dysuria.    O2 sat on arrival of EMS was 87%  Clinical Impression  Patient received in bed, she reports she does just fine, may go home today. She agrees to demonstrate mobility. Patient is independent with bed mobility and transfers and ambulation in room without AD. She performed toileting independently. Patient has no skilled PT needs at this time. Signing off.         Recommendations for follow up therapy are one component of a multi-disciplinary discharge planning process, led by the attending physician.  Recommendations may be updated based on patient status, additional functional criteria and insurance authorization.  Follow Up Recommendations No PT follow up      Assistance Recommended at Discharge PRN  Patient can return home with the following       Equipment Recommendations None recommended by PT  Recommendations for Other Services       Functional Status Assessment Patient has not had a recent decline in their functional status     Precautions / Restrictions Precautions Precautions: None Restrictions Weight Bearing Restrictions: No      Mobility  Bed Mobility Overal bed mobility: Independent                  Transfers Overall transfer level: Independent                      Ambulation/Gait Ambulation/Gait assistance: Independent   Assistive device: None Gait Pattern/deviations:  WFL(Within Functional Limits)          Stairs            Wheelchair Mobility    Modified Rankin (Stroke Patients Only)       Balance Overall balance assessment: Independent                                           Pertinent Vitals/Pain Pain Assessment Pain Assessment: No/denies pain    Home Living Family/patient expects to be discharged to:: Private residence Living Arrangements: Children Available Help at Discharge: Family;Available PRN/intermittently Type of Home: House Home Access: Stairs to enter Entrance Stairs-Rails: Psychiatric nurse of Steps: 3   Home Layout: One level Home Equipment: None      Prior Function Prior Level of Function : Independent/Modified Independent;Working/employed;Driving             Mobility Comments: independent ADLs Comments: independent     Hand Dominance   Dominant Hand: Right    Extremity/Trunk Assessment   Upper Extremity Assessment Upper Extremity Assessment: Overall WFL for tasks assessed    Lower Extremity Assessment Lower Extremity Assessment: Overall WFL for tasks assessed    Cervical / Trunk Assessment Cervical / Trunk Assessment: Normal  Communication   Communication: No difficulties  Cognition Arousal/Alertness: Awake/alert Behavior During Therapy: WFL for tasks assessed/performed  Overall Cognitive Status: Within Functional Limits for tasks assessed                                          General Comments      Exercises     Assessment/Plan    PT Assessment Patient does not need any further PT services  PT Problem List         PT Treatment Interventions      PT Goals (Current goals can be found in the Care Plan section)  Acute Rehab PT Goals Patient Stated Goal: to go home today Time For Goal Achievement: 03/06/22 Potential to Achieve Goals: Good    Frequency       Co-evaluation               AM-PAC PT "6 Clicks"  Mobility  Outcome Measure Help needed turning from your back to your side while in a flat bed without using bedrails?: None Help needed moving from lying on your back to sitting on the side of a flat bed without using bedrails?: None Help needed moving to and from a bed to a chair (including a wheelchair)?: None Help needed standing up from a chair using your arms (e.g., wheelchair or bedside chair)?: None Help needed to walk in hospital room?: None Help needed climbing 3-5 steps with a railing? : None 6 Click Score: 24    End of Session   Activity Tolerance: Patient tolerated treatment well Patient left: in chair;with call bell/phone within reach Nurse Communication: Mobility status      Time: 7616-0737 PT Time Calculation (min) (ACUTE ONLY): 11 min   Charges:   PT Evaluation $PT Eval Low Complexity: 1 Low          Sequan Auxier, PT, GCS 03/05/22,10:24 AM

## 2022-03-05 NOTE — Plan of Care (Signed)
  Problem: Education: Goal: Ability to describe self-care measures that may prevent or decrease complications (Diabetes Survival Skills Education) will improve Outcome: Progressing Goal: Individualized Educational Video(s) Outcome: Progressing   Problem: Coping: Goal: Ability to adjust to condition or change in health will improve Outcome: Progressing   Problem: Fluid Volume: Goal: Ability to maintain a balanced intake and output will improve Outcome: Progressing   Problem: Health Behavior/Discharge Planning: Goal: Ability to identify and utilize available resources and services will improve Outcome: Progressing Goal: Ability to manage health-related needs will improve Outcome: Progressing   Problem: Metabolic: Goal: Ability to maintain appropriate glucose levels will improve Outcome: Progressing   Problem: Nutritional: Goal: Maintenance of adequate nutrition will improve Outcome: Progressing Goal: Progress toward achieving an optimal weight will improve Outcome: Progressing   Problem: Skin Integrity: Goal: Risk for impaired skin integrity will decrease Outcome: Progressing   Problem: Tissue Perfusion: Goal: Adequacy of tissue perfusion will improve Outcome: Progressing   Problem: Fluid Volume: Goal: Hemodynamic stability will improve Outcome: Progressing   Problem: Clinical Measurements: Goal: Diagnostic test results will improve Outcome: Progressing Goal: Signs and symptoms of infection will decrease Outcome: Progressing   Problem: Respiratory: Goal: Ability to maintain adequate ventilation will improve Outcome: Progressing   Problem: Education: Goal: Knowledge of condition and prescribed therapy will improve Outcome: Progressing   Problem: Cardiac: Goal: Will achieve and/or maintain adequate cardiac output Outcome: Progressing   Problem: Physical Regulation: Goal: Complications related to the disease process, condition or treatment will be avoided or  minimized Outcome: Progressing   Problem: Education: Goal: Understanding of CV disease, CV risk reduction, and recovery process will improve Outcome: Progressing Goal: Individualized Educational Video(s) Outcome: Progressing   Problem: Activity: Goal: Ability to return to baseline activity level will improve Outcome: Progressing   Problem: Cardiovascular: Goal: Ability to achieve and maintain adequate cardiovascular perfusion will improve Outcome: Progressing Goal: Vascular access site(s) Level 0-1 will be maintained Outcome: Progressing   Problem: Health Behavior/Discharge Planning: Goal: Ability to safely manage health-related needs after discharge will improve Outcome: Progressing   Problem: Education: Goal: Knowledge of General Education information will improve Description: Including pain rating scale, medication(s)/side effects and non-pharmacologic comfort measures Outcome: Progressing   Problem: Health Behavior/Discharge Planning: Goal: Ability to manage health-related needs will improve Outcome: Progressing   Problem: Clinical Measurements: Goal: Ability to maintain clinical measurements within normal limits will improve Outcome: Progressing Goal: Will remain free from infection Outcome: Progressing Goal: Diagnostic test results will improve Outcome: Progressing Goal: Respiratory complications will improve Outcome: Progressing Goal: Cardiovascular complication will be avoided Outcome: Progressing   Problem: Activity: Goal: Risk for activity intolerance will decrease Outcome: Progressing   Problem: Nutrition: Goal: Adequate nutrition will be maintained Outcome: Progressing   Problem: Coping: Goal: Level of anxiety will decrease Outcome: Progressing   Problem: Elimination: Goal: Will not experience complications related to bowel motility Outcome: Progressing Goal: Will not experience complications related to urinary retention Outcome: Progressing    Problem: Pain Managment: Goal: General experience of comfort will improve Outcome: Progressing   Problem: Safety: Goal: Ability to remain free from injury will improve Outcome: Progressing   Problem: Skin Integrity: Goal: Risk for impaired skin integrity will decrease Outcome: Progressing

## 2022-03-05 NOTE — Progress Notes (Signed)
Patient ID: KYNZLEY DOWSON, female   DOB: 10-16-1950, 72 y.o.   MRN: [id number]   Progress Note  Patient Name: Erika Perry Date of Encounter: 03/05/2022  Primary Cardiologist: new - consult by [facility]   Subjective   No chest pain or dyspnea.  Feels good.   Cath 1/24: Nonobstructive CAD.  Mean RA 4, PA 26/10, mean PCWP 7, CI 3.34.   Inpatient Medications    Scheduled Meds:  aspirin  81 mg Oral Daily   ciprofloxacin  500 mg Oral BID   dapagliflozin propanediol  10 mg Oral Daily   insulin aspart  0-15 Units Subcutaneous TID WC   insulin aspart  0-5 Units Subcutaneous QHS   potassium chloride  40 mEq Oral Daily   rosuvastatin  20 mg Oral Daily   sacubitril-valsartan  1 tablet Oral BID   sodium chloride flush  3 mL Intravenous Q12H   sodium chloride flush  3 mL Intravenous Q12H   spironolactone  12.5 mg Oral Daily   Continuous Infusions:  sodium chloride Stopped (02/28/22 1430)   sodium chloride     PRN Meds: sodium chloride, acetaminophen **OR** acetaminophen, albuterol, HYDROcodone-acetaminophen, ondansetron (ZOFRAN) IV, sodium chloride flush   Vital Signs    Vitals:   03/04/22 2015 03/05/22 0037 03/05/22 0550 03/05/22 0836  BP: (!) 143/82 127/71 115/66 138/78  Pulse: 94 81 82 92  Resp: 16 16 17 18   Temp: 97.8 F (36.6 C) 97.9 F (36.6 C) 98.2 F (36.8 C) 98.1 F (36.7 C)  TempSrc:  Oral Oral Oral  SpO2: 97% 95% 95% 96%  Weight:      Height:        Intake/Output Summary (Last 24 hours) at 03/05/2022 0908 Last data filed at 03/05/2022 0306 Gross per 24 hour  Intake 18.25 ml  Output --  Net 18.25 ml   Filed Weights   02/28/22 0155 03/01/22 1300 03/04/22 0605  Weight: 83.9 kg 83.9 kg 84.6 kg    Telemetry    SR, 80s bpm - Personally Reviewed  ECG    No new tracings - Personally Reviewed  Physical Exam   General: NAD Neck: No JVD, no thyromegaly or thyroid nodule.  Lungs: Clear to auscultation bilaterally with normal respiratory effort. CV:  Nondisplaced PMI.  Heart regular S1/S2, no S3/S4, no murmur.  No peripheral edema.   Abdomen: Soft, nontender, no hepatosplenomegaly, no distention.  Skin: Intact without lesions or rashes.  Neurologic: Alert and oriented x 3.  Psych: Normal affect. Extremities: No clubbing or cyanosis.  HEENT: Normal.    Labs    Chemistry Recent Labs  Lab 03/01/22 0734 03/02/22 0506 03/03/22 0349 03/04/22 0501 03/04/22 1111  NA 133* 134* 132* 133* 135  K 3.5 3.4* 3.3* 3.7 3.9  CL 98 99 98 103  --   CO2 28 28 26 24   --   GLUCOSE 247* 148* 147* 136*  --   BUN 21 14 11 8   --   CREATININE 0.80 0.77 0.67 0.65  --   CALCIUM 7.6* 7.8* 8.1* 7.9*  --   PROT 5.9*  --   --   --   --   ALBUMIN 2.5*  --   --   --   --   AST 23  --   --   --   --   ALT 15  --   --   --   --   ALKPHOS 57  --   --   --   --  BILITOT 1.7*  --   --   --   --   GFRNONAA >60 >60 >60 >60  --   ANIONGAP 7 7 8 6   --      Hematology Recent Labs  Lab 03/03/22 0349 03/04/22 0501 03/04/22 1111 03/05/22 0509  WBC 3.8* 4.3  --  5.4  RBC 3.53* 3.56*  --  3.61*  HGB 10.1* 10.1* 10.2* 10.2*  HCT 31.5* 31.5* 30.0* 32.5*  MCV 89.2 88.5  --  90.0  MCH 28.6 28.4  --  28.3  MCHC 32.1 32.1  --  31.4  RDW 17.2* 17.2*  --  17.3*  PLT 105* 121*  --  145*    Cardiac EnzymesNo results for input(s): "TROPONINI" in the last 168 hours. No results for input(s): "TROPIPOC" in the last 168 hours.   BNPNo results for input(s): "BNP", "PROBNP" in the last 168 hours.   DDimer No results for input(s): "DDIMER" in the last 168 hours.   Radiology    CT Angio Chest Pulmonary Embolism (PE) W or WO Contrast  Result Date: 02/28/2022 IMPRESSION: 1. No pulmonary embolism. 2. Moderate coronary artery calcification within the left anterior descending coronary artery. 3. Scattered bilateral ground-glass opacity likely relates to atelectasis due to expiratory phase imaging. No definite superimposed focal pulmonary nodule or infiltrate. Aortic  Atherosclerosis (ICD10-I70.0). Electronically Signed   By: Fidela Salisbury M.D.   On: 02/28/2022 02:13   DG Chest Portable 1 View  Result Date: 02/27/2022 IMPRESSION: 1. No acute cardiopulmonary process. 2. Faint focal area of increased density in the right suprahilar region. Follow-up with chest radiograph or CT on a nonemergent/outpatient basis recommended. Electronically Signed   By: Anner Crete M.D.   On: 02/27/2022 21:53    Cardiac Studies   2D echo 02/28/2022: 1. Left ventricular ejection fraction, by estimation, is 35 to 40%. Left  ventricular ejection fraction by 2D MOD biplane is 41.3 %. The left  ventricle has moderately decreased function. The left ventricle  demonstrates global hypokinesis. Left  ventricular diastolic parameters are consistent with Grade I diastolic  dysfunction (impaired relaxation).   2. Right ventricular systolic function is normal. The right ventricular  size is normal. There is normal pulmonary artery systolic pressure. The  estimated right ventricular systolic pressure is 76.1 mmHg.   3. The mitral valve is normal in structure. Mild mitral valve  regurgitation. No evidence of mitral stenosis.   4. Tricuspid valve regurgitation is mild to moderate.   5. The aortic valve is tricuspid. Aortic valve regurgitation is not  visualized. No aortic stenosis is present.   6. The inferior vena cava is normal in size with greater than 50%  respiratory variability, suggesting right atrial pressure of 3 mmHg.   Patient Profile     72 y.o. female with history of uncontrolled DM, HTN, and HLD who was admitted with severe sepsis secondary to COVID-19 infection and UTI and is being seen by cardiology for the evaluation of mildly elevated high sensitivity troponin at the request of Dr. Damita Dunnings, found to have acute HFrEF.  Assessment & Plan    1.  Acute systolic heart failure: Cardiomyopathy of uncertain etiology (nonischemic).  Echo this admission with EF of 35 to  40%.  Cath showed nonobstructive CAD and normal filling pressures along with preserved cardiac output.  Not volume overloaded on exam, BP stable.  - Start Entresto 24/26 bid.  Needs BMET today.  - spironolactone 12.5 mg daily.  - Farxiga 10 mg daily.  -  Start beta blocker as outpatient.  - Would obtain cardiac MRI as outpatient to assess for infiltrative disease or myocarditis.  2.  CAD: HS-TnI peak 333. Suspect demand ischemia, she had nonobstructive CAD on cath.  - ASA 81 - Would continue statin with nonobstructive CAD.   3.  Septic shock due to E. coli bacteremia and UTI:  Significant improvement with antibiotics and IV fluids. - Remains on cefazolin.  4. Poorly controlled diabetes: Last A1c 11.1 -Management per primary service 5. COVID-19: Mild cough.   OK for home from cardiac perspective.  Will arrange followup.  Cardiac meds: ASA 81, Crestor 20, Entresto 24/26 bid, spironolactone 12.5 daily, Farxiga 10 daily, ASA 81 daily.    For questions or updates, please contact [facility] Please consult [email or web address] for contact info under Cardiology/STEMI.    Signed Marca Ancona 03/05/2022 9:08 AM

## 2022-03-05 NOTE — Progress Notes (Signed)
OT Cancellation Note  Patient Details Name: Erika Perry MRN: [id number] DOB: 11-23-1950   Cancelled Treatment:    Reason Eval/Treat Not Completed: OT screened, no needs identified, will sign off. Order received, chart reviewed. Per conversation with PT, pt back to baseline functional independence. No skilled OT needs identified. Will sign off. Please re-consult if additional needs arise.   Dessie Coma, M.S. OTR/L  03/05/22, 10:15 AM  ascom [phone]

## 2022-03-05 NOTE — Consult Note (Addendum)
   Heart Failure Nurse Navigator Note  HFrEF 35-40%.  Grade 1 diastolic dysfunction.  Normal right ventricular systolic function.  Mild mitral regurgitation.  Mild to moderate tricuspid regurgitation.  She presented to the emergency room with complaints of dizziness and confusion, she had also noted a cough for approximately a week and chills but no fever.  Temp was 100.1, blood pressure 90/56 and was COVID-positive.  This x-ray showed no acute cardiopulmonary process.  Comorbidities:  Uncontrolled diabetes Retention Hyperlipidemia  Medications:  Aspirin 81 mg daily Farxiga 10 mg daily Potassium chloride Crestor 20 mg daily Entresto 24/26 mg 2 times a day Spironolactone 12.5 mg  daily  Labs:  Sodium 134, potassium 4, chloride 103, CO2 23, BUN 7, creatinine 0.74, estimated GFR greater than 60 Weight not documented Intake 18 mL Output not documented   Initial meeting with patient, she was sitting on the edge of the bed in no acute distress.  There were no family members present.  She states that she lives at home with her daughter.  She states that they very rarely eat in restaurants.  Discussed heart failure and what it means.  Went over the importance of daily weights, reporting weight gain of 2 pounds overnight or 5 pounds within the week, or changes in her symptoms.  Limiting fluid intake to no more than 64 ounces daily.  Removing the saltshaker from the table and limiting sodium intake to less than 2000 mg daily.  Also discussed the ventricle health program for which she qualifies for she was given written information, states that she would like to think about it.  She feels that she is being discharged home today and I explained to her if she did not make a decision before discharge that she could let me know when she comes in for her posthospital visit with the heart failure clinic.  She voices understanding.  She was given the living with heart failure teaching booklet,  zone magnet, info on heart failure and low-sodium along with weight chart.  She had no further questions.  Staff was asked to weigh her before going home, so she would have an idea what her weight is at discharge.  She has follow-up in the advanced heart failure clinic on February 5 at [place]

## 2022-03-06 ENCOUNTER — Telehealth: Payer: Self-pay | Admitting: *Deleted

## 2022-03-06 DIAGNOSIS — R6520 Severe sepsis without septic shock: Secondary | ICD-10-CM

## 2022-03-06 NOTE — Addendum Note (Signed)
Addended by: Verlin Grills on: 03/06/2022 01:13 PM   Modules accepted: Orders

## 2022-03-06 NOTE — Patient Outreach (Signed)
  Care Coordination [facility] Note Transition Care Management Follow-up Telephone Call Date of discharge and from where: [facility] [id number] Sepsis How have you been since you were released from the hospital? Doing much better Any questions or concerns? No  Items Reviewed: Did the pt receive and understand the discharge instructions provided? Yes  Medications obtained and verified? Yes  Other? No  Any new allergies since your discharge? No  Dietary orders reviewed? Yes Do you have support at home? Yes   Home Care and Equipment/Supplies: Were home health services ordered? no If so, what is the name of the agency? N/a  Has the agency set up a time to come to the patient's home? not applicable Were any new equipment or medical supplies ordered?  No What is the name of the medical supply agency? N/a Were you able to get the supplies/equipment? no Do you have any questions related to the use of the equipment or supplies? No  Functional Questionnaire: (I = Independent and D = Dependent) ADLs: I  Bathing/Dressing- I  Meal Prep- I  Eating- I  Maintaining continence- I  Transferring/Ambulation- I  Managing Meds- I  Follow up appointments reviewed:  PCP Hospital f/u appt confirmed? Yes  Scheduled to see Dr Caryl Bis [phone] 11:00. [facility] f/u appt confirmed? Yes  Scheduled to see Dr Daniel Nones [phone] 11:00  Are transportation arrangements needed? No  If their condition worsens, is the pt aware to call PCP or go to the Emergency Dept.? Yes Was the patient provided with contact information for the PCP's office or ED? Yes Was to pt encouraged to call back with questions or concerns? Yes  SDOH assessments and interventions completed:   Yes SDOH Interventions Today    Flowsheet Row Most Recent Value  SDOH Interventions   Food Insecurity Interventions Intervention Not Indicated  Housing Interventions Intervention Not Indicated  Transportation Interventions Intervention Not  Indicated       Care Coordination Interventions:  RN sent to care guide [employer] for 14 day meal plan    Encounter Outcome:  Pt. Visit Completed    [facility] [phone]

## 2022-03-09 ENCOUNTER — Telehealth: Payer: Self-pay

## 2022-03-09 NOTE — Telephone Encounter (Signed)
   Telephone encounter was:  Unsuccessful.  03/09/2022 Name: Erika Perry MRN: [id number] DOB: 1950-09-15  Unsuccessful outbound call made today to assist with:  Food Insecurity  Outreach Attempt:  1st Attempt  A HIPAA compliant voice message was left requesting a return call.  Instructed patient to call back   [place], [facility]  [phone] [address], [place], [place] [address] Phone: [phone] Email: [email or web address]

## 2022-03-10 ENCOUNTER — Encounter: Payer: Self-pay | Admitting: Family Medicine

## 2022-03-10 ENCOUNTER — Ambulatory Visit (INDEPENDENT_AMBULATORY_CARE_PROVIDER_SITE_OTHER): Payer: 59 | Admitting: Family Medicine

## 2022-03-10 VITALS — BP 120/70 | HR 92 | Temp 98.3°F | Ht 62.0 in | Wt 181.0 lb

## 2022-03-10 DIAGNOSIS — N3000 Acute cystitis without hematuria: Secondary | ICD-10-CM

## 2022-03-10 DIAGNOSIS — E1165 Type 2 diabetes mellitus with hyperglycemia: Secondary | ICD-10-CM

## 2022-03-10 DIAGNOSIS — S42401D Unspecified fracture of lower end of right humerus, subsequent encounter for fracture with routine healing: Secondary | ICD-10-CM | POA: Diagnosis not present

## 2022-03-10 DIAGNOSIS — U071 COVID-19: Secondary | ICD-10-CM

## 2022-03-10 DIAGNOSIS — I5021 Acute systolic (congestive) heart failure: Secondary | ICD-10-CM

## 2022-03-10 LAB — CBC
HCT: 34.7 % — ABNORMAL LOW (ref 36.0–46.0)
Hemoglobin: 11.5 g/dL — ABNORMAL LOW (ref 12.0–15.0)
MCHC: 33.1 g/dL (ref 30.0–36.0)
MCV: 88.4 fl (ref 78.0–100.0)
Platelets: 236 10*3/uL (ref 150.0–400.0)
RBC: 3.93 Mil/uL (ref 3.87–5.11)
RDW: 18.5 % — ABNORMAL HIGH (ref 11.5–15.5)
WBC: 6.8 10*3/uL (ref 4.0–10.5)

## 2022-03-10 LAB — BASIC METABOLIC PANEL
BUN: 7 mg/dL (ref 6–23)
CO2: 25 mEq/L (ref 19–32)
Calcium: 9.1 mg/dL (ref 8.4–10.5)
Chloride: 103 mEq/L (ref 96–112)
Creatinine, Ser: 0.66 mg/dL (ref 0.40–1.20)
GFR: 88.71 mL/min (ref >60.00)
Glucose, Bld: 152 mg/dL — ABNORMAL HIGH (ref 70–99)
Potassium: 4 mEq/L (ref 3.5–5.1)
Sodium: 139 mEq/L (ref 135–145)

## 2022-03-10 MED ORDER — LANCETS MISC. MISC: 1.0000 | Freq: Every day | 0 refills | Status: DC

## 2022-03-10 MED ORDER — LANCET DEVICE MISC: 1.0000 | Freq: Every day | 0 refills | Status: AC

## 2022-03-10 MED ORDER — OZEMPIC (0.25 OR 0.5 MG/DOSE) 2 MG/3ML ~~LOC~~ SOPN: PEN_INJECTOR | SUBCUTANEOUS | 3 refills | Status: DC

## 2022-03-10 MED ORDER — BLOOD GLUCOSE MONITORING SUPPL DEVI: 1.0000 | Freq: Every day | 0 refills | Status: DC

## 2022-03-10 MED ORDER — BENZONATATE 200 MG PO CAPS: 200.0000 mg | ORAL_CAPSULE | Freq: Two times a day (BID) | ORAL | 0 refills | Status: DC | PRN

## 2022-03-10 MED ORDER — BLOOD GLUCOSE TEST VI STRP: 1.0000 | ORAL_STRIP | Freq: Every day | 0 refills | Status: AC

## 2022-03-10 NOTE — Assessment & Plan Note (Signed)
Patient with reduced EF on recent echo.  This may be associated with her acute issues and could potentially improve with time.  For now she will continue Farxiga 10 mg daily, Entresto 1 tablet twice daily, and spironolactone half a tablet daily.  She will see cardiology as planned.  Check labs today.

## 2022-03-10 NOTE — Assessment & Plan Note (Signed)
Reports she is due for another surgery for this. They will see cardiology for clearance from a cardiac perspective. I will see what her labs show and then consider medical clearance.

## 2022-03-10 NOTE — Progress Notes (Signed)
Tommi Rumps, MD Phone: [phone]  Erika Perry is a 72 y.o. female who presents today for f/u.  UTI/sepsis/COVID-19: Patient presented to the emergency department with confusion and lightheadedness.  She had cough and chills for about a week.  Oxygen was found to be 87% on room air and she was hypotensive with a lactic acid of 6.7 and an elevated procalcitonin.  She was diagnosed with UTI and sepsis related to that.  She was also diagnosed with COVID-19.  She was outside treatment window for COVID-19 so that was not treated.  She was treated with Levophed given her low blood pressure.  She was treated with IV antibiotics and transition to oral ciprofloxacin for 14 days.  Her troponins were elevated and she had a cardiac catheterization that revealed clean coronary arteries though she was found to have CHF with an EF of 35-40%.  Since discharge from the hospital she is feeling better.  She has had no fevers, abdominal pain, dysuria, frequency, or urinary urgency.  She notes no orthopnea, PND, swelling, or shortness of breath.  She has cough mostly at night that is bothersome.  She follows up with cardiology on Monday.  She has been started on Farxiga, Entresto, and spironolactone.  Diabetes: She is not checking at home.  She does not have a meter.  She is currently on Farxiga and metformin.  Social History   Tobacco Use  Smoking Status Never  Smokeless Tobacco Never    Current Outpatient Medications on File Prior to Visit  Medication Sig Dispense Refill   aspirin 81 MG chewable tablet Chew 1 tablet (81 mg total) by mouth daily. 30 tablet 0   ciprofloxacin (CIPRO) 500 MG tablet Take 1 tablet (500 mg total) by mouth 2 (two) times daily for 9 days. 18 tablet 0   dapagliflozin propanediol (FARXIGA) 10 MG TABS tablet Take 1 tablet (10 mg total) by mouth daily. 30 tablet 0   metFORMIN (GLUCOPHAGE) 500 MG tablet Take 1 tablet (500 mg total) by mouth 2 (two) times daily with a meal. 180  tablet 3   rosuvastatin (CRESTOR) 20 MG tablet Take 1 tablet (20 mg total) by mouth daily. 90 tablet 3   sacubitril-valsartan (ENTRESTO) 24-26 MG Take 1 tablet by mouth 2 (two) times daily. 60 tablet 0   spironolactone (ALDACTONE) 25 MG tablet Take 0.5 tablets (12.5 mg total) by mouth daily. 15 tablet 0   [DISCONTINUED] atorvastatin (LIPITOR) 10 MG tablet Take 1 tablet (10 mg total) by mouth daily. 90 tablet 1   No current facility-administered medications on file prior to visit.     ROS see history of present illness  Objective  Physical Exam Vitals:   03/10/22 1100  BP: 120/70  Pulse: 92  Temp: 98.3 F (36.8 C)  SpO2: 97%    BP Readings from Last 3 Encounters:  03/10/22 120/70  03/05/22 138/80  01/19/22 110/70   Wt Readings from Last 3 Encounters:  03/10/22 181 lb (82.1 kg)  03/05/22 181 lb (82.1 kg)  01/19/22 185 lb (83.9 kg)    Physical Exam Constitutional:      General: She is not in acute distress.    Appearance: She is not diaphoretic.  Cardiovascular:     Rate and Rhythm: Normal rate and regular rhythm.     Heart sounds: Normal heart sounds.  Pulmonary:     Effort: Pulmonary effort is normal.     Breath sounds: Normal breath sounds.  Skin:    General: Skin is  warm and dry.  Neurological:     Mental Status: She is alert.      Assessment/Plan: Please see individual problem list.  Uncontrolled type 2 diabetes mellitus with hyperglycemia, without long-term current use of insulin ([place]) Assessment & Plan: Chronic issue.  A1c in the hospital was improved though still above goal.  She will continue Farxiga 10 mg daily and metformin 500 mg twice daily.  Plan to start patient on Ozempic 0.25 mg weekly for 4 weeks and then increase to 0.5 mg weekly.  Discussed that this medication would benefit her heart as well as her diabetes.  We will start the patient on ozempic.  They were counseled on the risk of pancreatitis and gallbladder disease.  Discussed the risk  of nausea.  They were advised to discontinue the [facility] and contact us immediately if they develop abdominal pain.  If they develop excessive nausea they will contact us right away.  I discussed that medullary thyroid cancer has been seen in rats studies.  The patient confirmed no personal or family history of thyroid cancer, parathyroid cancer, or adrenal gland cancer.  Discussed that we thus far have not seen medullary thyroid cancer associated with use of this type of medication in humans.  They were advised to monitor the thyroid area and contact us for any lumps, swelling, trouble swallowing, or any other changes in this area.    Orders: -     Basic metabolic panel -     Blood Glucose Monitoring Suppl; 1 each by Does not apply route daily before breakfast. May substitute to any manufacturer covered by patient's insurance.  Dispense: 1 each; Refill: 0 -     Blood Glucose Test; 1 each by In Vitro route daily. May substitute to any manufacturer covered by patient's insurance.  Dispense: 100 strip; Refill: 0 -     Lancet Device; 1 each by Does not apply route daily. May substitute to any manufacturer covered by patient's insurance.  Dispense: 1 each; Refill: 0 -     Lancets Misc.; 1 each by Does not apply route daily. May substitute to any manufacturer covered by patient's insurance.  Dispense: 100 each; Refill: 0 -     Ozempic (0.25 or 0.5 MG/DOSE); Inject 0.25 mg into the skin once a week for 28 days, THEN 0.5 mg once a week.  Dispense: 3 mL; Refill: 3  Acute systolic CHF (congestive heart failure) ([place]) Assessment & Plan: Patient with reduced EF on recent echo.  This may be associated with her acute issues and could potentially improve with time.  For now she will continue Farxiga 10 mg daily, Entresto 1 tablet twice daily, and spironolactone half a tablet daily.  She will see cardiology as planned.  Check labs today.  Orders: -     Basic metabolic panel -     CBC  Acute cystitis without  hematuria Assessment & Plan: Symptoms have improved.  She will complete her course of ciprofloxacin.  Discussed rechecking her urine in 4 weeks.  If she has recurrence of symptoms she will let us know.  Orders: -     POCT urinalysis dipstick; Future  COVID-19 Assessment & Plan: Patient with recent COVID infection leading into her hospitalization.  She continues to have some cough though it is better.  Will treat with Tessalon.  If she has any worsening symptoms at any point she will let us know.  Patient wondered about checking a COVID antibody because she was not quite sure if  she actually had COVID.  I discussed that this would not be of use given that it could be positive from a prior COVID infection in the past and would not change management.  Orders: -     Benzonatate; Take 1 capsule (200 mg total) by mouth 2 (two) times daily as needed for cough.  Dispense: 20 capsule; Refill: 0  Closed fracture of distal end of right humerus with routine healing, unspecified fracture morphology, subsequent encounter Assessment & Plan: Reports she is due for another surgery for this. They will see cardiology for clearance from a cardiac perspective. I will see what her labs show and then consider medical clearance.      Return in about 4 weeks (around 04/07/2022) for DM/urine follow-up.  I have spent 41 minutes in the care of this patient regarding history taking, documentation, completion of exam, discussion of plan, review of discharge summary, placing orders.   Tommi Rumps, MD [place]

## 2022-03-10 NOTE — Assessment & Plan Note (Signed)
Patient with recent COVID infection leading into her hospitalization.  She continues to have some cough though it is better.  Will treat with Tessalon.  If she has any worsening symptoms at any point she will let us know.  Patient wondered about checking a COVID antibody because she was not quite sure if she actually had COVID.  I discussed that this would not be of use given that it could be positive from a prior COVID infection in the past and would not change management.

## 2022-03-10 NOTE — Assessment & Plan Note (Addendum)
Chronic issue.  A1c in the hospital was improved though still above goal.  She will continue Farxiga 10 mg daily and metformin 500 mg twice daily.  Plan to start patient on Ozempic 0.25 mg weekly for 4 weeks and then increase to 0.5 mg weekly.  Discussed that this medication would benefit her heart as well as her diabetes.  We will start the patient on ozempic.  They were counseled on the risk of pancreatitis and gallbladder disease.  Discussed the risk of nausea.  They were advised to discontinue the [facility] and contact us immediately if they develop abdominal pain.  If they develop excessive nausea they will contact us right away.  I discussed that medullary thyroid cancer has been seen in rats studies.  The patient confirmed no personal or family history of thyroid cancer, parathyroid cancer, or adrenal gland cancer.  Discussed that we thus far have not seen medullary thyroid cancer associated with use of this type of medication in humans.  They were advised to monitor the thyroid area and contact us for any lumps, swelling, trouble swallowing, or any other changes in this area.

## 2022-03-10 NOTE — Patient Instructions (Signed)
Nice to see you. We will get some labs today and have you return in 4 weeks to recheck your urine.  If you start to feel poorly again please let me know right away.  We are starting ozempic. This is a once weekly injection. You will take 0.25 mg weekly for 4 weeks and then increase to 0.5 mg weekly.

## 2022-03-10 NOTE — Assessment & Plan Note (Signed)
Symptoms have improved.  She will complete her course of ciprofloxacin.  Discussed rechecking her urine in 4 weeks.  If she has recurrence of symptoms she will let us know.

## 2022-03-13 ENCOUNTER — Telehealth: Payer: Self-pay

## 2022-03-13 NOTE — Telephone Encounter (Signed)
   Telephone encounter was:  Unsuccessful.  03/13/2022 Name: Erika Perry MRN: [id number] DOB: [birth year]  Unsuccessful outbound call made today to assist with:  Food Insecurity  Outreach Attempt:  2nd Attempt  A HIPAA compliant voice message was left requesting a return call.  Instructed patient to call back.    [place] [phone] [address], [place], [place] [address] Phone: [phone] Email: [email or web address]

## 2022-03-16 ENCOUNTER — Other Ambulatory Visit
Admission: RE | Admit: 2022-03-16 | Discharge: 2022-03-16 | Disposition: A | Discharge status: Home / Self Care | Payer: 59 | Source: Ambulatory Visit | Attending: Cardiology | Admitting: Cardiology

## 2022-03-16 ENCOUNTER — Encounter: Payer: Self-pay | Admitting: Cardiology

## 2022-03-16 ENCOUNTER — Ambulatory Visit (HOSPITAL_BASED_OUTPATIENT_CLINIC_OR_DEPARTMENT_OTHER): Payer: 59 | Admitting: Cardiology

## 2022-03-16 ENCOUNTER — Other Ambulatory Visit: Payer: Self-pay | Admitting: Cardiology

## 2022-03-16 ENCOUNTER — Encounter: Payer: Self-pay | Admitting: Family Medicine

## 2022-03-16 VITALS — BP 118/68 | HR 96 | Wt 179.2 lb

## 2022-03-16 DIAGNOSIS — E1165 Type 2 diabetes mellitus with hyperglycemia: Secondary | ICD-10-CM

## 2022-03-16 DIAGNOSIS — I5022 Chronic systolic (congestive) heart failure: Secondary | ICD-10-CM | POA: Insufficient documentation

## 2022-03-16 DIAGNOSIS — I2510 Atherosclerotic heart disease of native coronary artery without angina pectoris: Secondary | ICD-10-CM | POA: Diagnosis not present

## 2022-03-16 LAB — BASIC METABOLIC PANEL
Anion gap: 13 (ref 5–15)
BUN: 10 mg/dL (ref 8–23)
CO2: 24 mmol/L (ref 22–32)
Calcium: 9.2 mg/dL (ref 8.9–10.3)
Chloride: 101 mmol/L (ref 98–111)
Creatinine, Ser: 0.64 mg/dL (ref 0.44–1.00)
GFR, Estimated: >60 mL/min (ref >60)
Glucose, Bld: 127 mg/dL — ABNORMAL HIGH (ref 70–99)
Potassium: 3.4 mmol/L — ABNORMAL LOW (ref 3.5–5.1)
Sodium: 138 mmol/L (ref 135–145)

## 2022-03-16 LAB — BRAIN NATRIURETIC PEPTIDE: B Natriuretic Peptide: 13.3 pg/mL (ref 0.0–100.0)

## 2022-03-16 MED ORDER — CARVEDILOL 3.125 MG PO TABS: 3.1250 mg | ORAL_TABLET | Freq: Two times a day (BID) | ORAL | 3 refills | Status: DC

## 2022-03-16 MED ORDER — SPIRONOLACTONE 25 MG PO TABS: 25.0000 mg | ORAL_TABLET | Freq: Every day | ORAL | 0 refills | Status: DC

## 2022-03-16 NOTE — Patient Instructions (Signed)
Medication Changes:  Increase Spironolactone to 25 mg (one tablet) take daily.  START taking Coreg 3.125 mg (one tablet) take two times a day.   Lab Work:  Labs done today, your results will be available in MyChart, we will contact you for abnormal readings.   Testing/Procedures:  Your physician has requested that you have an echocardiogram. Echocardiography is a painless test that uses sound waves to create images of your heart. It provides your doctor with information about the size and shape of your heart and how well your heart's chambers and valves are working. This procedure takes approximately one hour. There are no restrictions for this procedure. Please do NOT wear cologne, perfume, aftershave, or lotions (deodorant is allowed). Please arrive 15 minutes prior to your appointment time.   The office will call you to schedule time and date.     Follow-Up in: in two months. The office will call you to schedule date and time.     If you have any questions or concerns before your next appointment please send [place] a message through [place] or call our office at [phone] Monday-Friday 8 am-5 pm.   If you have an urgent need after hours on the weekend please call your Primary Cardiologist or the [facility] in [place] at [phone].

## 2022-03-16 NOTE — Progress Notes (Signed)
ADVANCED HEART FAILURE CLINIC NOTE  Referring Physician: Leone Haven, MD  Primary Care: Leone Haven, MD Primary HF Cardiologist: Dr. Loralie Champagne  HPI: Erika Perry is a 72 y.o. female with uncontrolled type 2 diabetes, hypertension, hyperlipidemia and recent diagnosis of heart failure with reduced ejection fraction presenting today for posthospital follow-up.  Ms. Fesler was recently admitted to [facility] with severe sepsis secondary to COVID-19 and UTI.  During that time she had an echocardiogram demonstrating LVEF of 35 to 40%.  Left heart cath with nonobstructive CAD and normal filling pressures with preserved cardiac output.  She was seen by Dr. Trey Paula, GDMT was added on and patient was discharged home.  Interval history Since discharge from the hospital, Ms. Petralia has been doing very well.  She reports no shortness of breath, lower extremity edema, chest pressure or functional limitations aside from those associated with her arthritis.  She reports to feeling back at her baseline as she did prior to recent admission for E. coli bacteremia.  Reports compliance with all of her medications.  She does have severe right shoulder pain and would like surgical clearance for this.  Activity level/exercise tolerance: NYHA II, only limited by significant right shoulder pain Orthopnea:  Sleeps on 2 pillows Paroxysmal noctural dyspnea:  No Chest pain/pressure:  No Orthostatic lightheadedness:  No Palpitations:  No Lower extremity edema:  No Presyncope/syncope:  No Cough:  No  Past Medical History:  Diagnosis Date   BMI 35.0-35.9,adult 07/17/2020   Closed fracture of right distal humerus 07/12/2021   HLD (hyperlipidemia)    Hypertension    Pre-diabetes    Vitamin D deficiency 07/13/2021    Current Outpatient Medications  Medication Sig Dispense Refill   aspirin 81 MG chewable tablet Chew 1 tablet (81 mg total) by mouth daily. 30 tablet 0   benzonatate (TESSALON) 200 MG capsule  Take 1 capsule (200 mg total) by mouth 2 (two) times daily as needed for cough. 20 capsule 0   Blood Glucose Monitoring Suppl DEVI 1 each by Does not apply route daily before breakfast. May substitute to any manufacturer covered by patient's insurance. 1 each 0   dapagliflozin propanediol (FARXIGA) 10 MG TABS tablet Take 1 tablet (10 mg total) by mouth daily. 30 tablet 0   docusate sodium (COLACE) 100 MG capsule Take 100 mg by mouth 2 (two) times daily.     Glucose Blood (BLOOD GLUCOSE TEST STRIPS) STRP 1 each by In Vitro route daily. May substitute to any manufacturer covered by patient's insurance. 100 strip 0   Lancet Device MISC 1 each by Does not apply route daily. May substitute to any manufacturer covered by patient's insurance. 1 each 0   Lancets Misc. MISC 1 each by Does not apply route daily. May substitute to any manufacturer covered by patient's insurance. 100 each 0   metFORMIN (GLUCOPHAGE) 500 MG tablet Take 1 tablet (500 mg total) by mouth 2 (two) times daily with a meal. 180 tablet 3   rosuvastatin (CRESTOR) 20 MG tablet Take 1 tablet (20 mg total) by mouth daily. 90 tablet 3   sacubitril-valsartan (ENTRESTO) 24-26 MG Take 1 tablet by mouth 2 (two) times daily. 60 tablet 0   Semaglutide,0.25 or 0.5MG /DOS, (OZEMPIC, 0.25 OR 0.5 MG/DOSE,) 2 MG/3ML SOPN Inject 0.25 mg into the skin once a week for 28 days, THEN 0.5 mg once a week. 3 mL 3   spironolactone (ALDACTONE) 25 MG tablet Take 0.5 tablets (12.5 mg total) by mouth  daily. 15 tablet 0   No current facility-administered medications for this visit.    Allergies  Allergen Reactions   Latex     Pt states that she is allergic to latex in pre-op.  Regardless of allergy type latex will not be used.    Bactrim [Sulfamethoxazole-Trimethoprim]     Nausea, no appitite   Penicillins     Tolerated Cephalosporin Date: 07/11/2021.        Social History   Socioeconomic History   Marital status: Widowed    Spouse name: Not on file    Number of children: Not on file   Years of education: Not on file   Highest education level: Not on file  Occupational History   Not on file  Tobacco Use   Smoking status: Never   Smokeless tobacco: Never  Vaping Use   Vaping Use: Never used  Substance and Sexual Activity   Alcohol use: No   Drug use: Never   Sexual activity: Not Currently  Other Topics Concern   Not on file  Social History Narrative   Not on file   Social Determinants of Health   Financial Resource Strain: Low Risk  (08/07/2021)   Overall Financial Resource Strain (CARDIA)    Difficulty of Paying Living Expenses: Not hard at all  Food Insecurity: No Food Insecurity (03/06/2022)   Hunger Vital Sign    Worried About Running Out of Food in the Last Year: Never true    [place] in the Last Year: Never true  Transportation Needs: No Transportation Needs (03/06/2022)   PRAPARE - Hydrologist (Medical): No    Lack of Transportation (Non-Medical): No  Physical Activity: Not on file  Stress: No Stress Concern Present (08/07/2021)   [place]    Feeling of Stress : Not at all  Social Connections: Unknown (08/07/2021)   Social Connection and Isolation Panel [NHANES]    Frequency of Communication with Friends and Family: More than three times a week    Frequency of Social Gatherings with Friends and Family: More than three times a week    Attends Religious Services: Not on file    Active Member of Clubs or Organizations: Not on file    Attends Archivist Meetings: Not on file    Marital Status: Widowed  Intimate Partner Violence: Not At Risk (08/07/2021)   Humiliation, Afraid, Rape, and Kick questionnaire    Fear of Current or Ex-Partner: No    Emotionally Abused: No    Physically Abused: No    Sexually Abused: No      Family History  Problem Relation Age of Onset   Cancer Mother    Diabetes Mother     Hypertension Mother    Other Father        unknown medical history    PHYSICAL EXAM: Vitals:   03/16/22 1047  BP: 118/68  Pulse: 96  SpO2: 100%   GENERAL: Well nourished, well developed, and in no apparent distress at rest.  HEENT: Negative for arcus senilis or xanthelasma. There is no scleral icterus.  The mucous membranes are pink and moist.   NECK: Supple, No masses. Normal carotid upstrokes without bruits. No masses or thyromegaly.    CHEST: There are no chest wall deformities. There is no chest wall tenderness. Respirations are unlabored.  Lungs- CTA B/L CARDIAC:  JVP: 7cm cm H2O  Normal S1, S2  Normal rate with regular rhythm. No murmurs, rubs or gallops.  Pulses are 2+ and symmetrical in upper and lower extremities. No edema.  ABDOMEN: Soft, non-tender, non-distended. There are no masses or hepatomegaly. There are normal bowel sounds.  EXTREMITIES: Warm and well perfused with no cyanosis, clubbing.  LYMPHATIC: No axillary or supraclavicular lymphadenopathy.  NEUROLOGIC: Patient is oriented x3 with no focal or lateralizing neurologic deficits.  PSYCH: Patients affect is appropriate, there is no evidence of anxiety or depression.  SKIN: Warm and dry; no lesions or wounds.   DATA REVIEW  ECG: 03/02/22: sinus tachycardia  ECHO: 02/28/22: LVEF 35%-40%, Grade 1 DD, Normal RV function.   CATH: 03/04/22:   Prox RCA lesion is 30% stenosed.   Prox Cx to Mid Cx lesion is 40% stenosed.   Mid LAD lesion is 40% stenosed.   There is mild left ventricular systolic dysfunction.   LV end diastolic pressure is normal.   The left ventricular ejection fraction is 45-50% by visual estimate.   1.  Moderate nonobstructive coronary artery disease. 2.  Mildly reduced LV systolic function. 3.  Right heart catheterization showed normal right and left-sided filling pressures, normal pulmonary pressure and normal cardiac output.   RA pressure: 4 mmHg, RV: 32 /5 mmHg, PW: 7 mmHg, PA:  26/10 mmHg, cardiac output is 6.2 with a cardiac index of 3.34.   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 LHC as noted above with non-obstructive CAD. She has hardware in her right elbow after shoulder repair that she believes will preclude her from CMR. Will obtain repeat echo in 2 months and follow up with Dr. Aundra Dubin in 63m.  NYHA class / AHA Stage:NYHA II, doing very well functionally today, reports feeling at her baseline.  Volume status & Diuretics: Euvolemic, off diuretics. RHC during admit with low filling pressures.  Vasodilators:Entresto 24/26mg  BID Beta-Blocker:Start coreg 3.125mg  BID WPY:KDXIPJAS spironolactone to 25mg  daily Cardiometabolic:start farxiga 10mg   Devices therapies & Valvulopathies:Not currently indicated Advanced therapies:Not indicated  2. CAD - Nonobstructive CAD by LHC  - ASA 81mg  daily  - Elevated ASCVD, on crestor 20mg  daily.   3. T2DM - I discussed the importance of weight loss and dietary control.  - Farxiga 10mg  - Metformin - Followed by her PCP  4. Shoulder arthritis  - Reports that she will require arthroscopic procedure for her right shoulder  - From a surgical standpoint, RCRI risk of 6% (low risk); she is stable from a heart failure standpoint and low risk to proceed.    Follow-up:  Follow up with Dr. Aundra Dubin in ~1m   Woodie Degraffenreid Advanced Heart Failure Mechanical Circulatory Support

## 2022-03-17 ENCOUNTER — Telehealth: Payer: Self-pay

## 2022-03-17 NOTE — Telephone Encounter (Signed)
   Telephone encounter was:  Unsuccessful.  03/17/2022 Name: CONCHA SUDOL MRN: [id number] DOB: 03/23/1950  Unsuccessful outbound call made today to assist with:  Food Insecurity  Outreach Attempt:  3rd Attempt.  Referral closed unable to contact patient.  A HIPAA compliant voice message was left requesting a return call.  Instructed patient to call back    [place] [phone], [place], [place] [address] Phone: [phone] Email: [email or web address]

## 2022-03-19 NOTE — Telephone Encounter (Signed)
Letter was placed up front today for patient to pick up.  Arine Foley,cma

## 2022-04-07 ENCOUNTER — Telehealth: Payer: Self-pay | Admitting: Cardiology

## 2022-04-07 NOTE — Telephone Encounter (Signed)
LVM for pt TCB and schedule appt from recall  April appt with DM.

## 2022-04-13 ENCOUNTER — Encounter: Payer: Self-pay | Admitting: Family Medicine

## 2022-04-13 ENCOUNTER — Ambulatory Visit (INDEPENDENT_AMBULATORY_CARE_PROVIDER_SITE_OTHER): Payer: 59 | Admitting: Family Medicine

## 2022-04-13 VITALS — BP 152/80 | HR 80 | Temp 97.8°F | Ht 62.0 in | Wt 179.4 lb

## 2022-04-13 DIAGNOSIS — I5020 Unspecified systolic (congestive) heart failure: Secondary | ICD-10-CM | POA: Diagnosis not present

## 2022-04-13 DIAGNOSIS — I10 Essential (primary) hypertension: Secondary | ICD-10-CM | POA: Diagnosis not present

## 2022-04-13 DIAGNOSIS — N3000 Acute cystitis without hematuria: Secondary | ICD-10-CM

## 2022-04-13 DIAGNOSIS — E1165 Type 2 diabetes mellitus with hyperglycemia: Secondary | ICD-10-CM

## 2022-04-13 LAB — POCT URINALYSIS DIPSTICK
Bilirubin, UA: NEGATIVE
Glucose, UA: NEGATIVE
Ketones, UA: NEGATIVE
Nitrite, UA: NEGATIVE
Protein, UA: POSITIVE — AB
Spec Grav, UA: 1.02 (ref 1.010–1.025)
Urobilinogen, UA: 1 E.U./dL
pH, UA: 7 (ref 5.0–8.0)

## 2022-04-13 NOTE — Assessment & Plan Note (Addendum)
Above goal today.  She will continue carvedilol 3.125 mg twice daily and spironolactone 25 mg daily.  I will check with her cardiologist to see if they want her on Farxiga and Entresto or to see if she can restart HCTZ and losartan.

## 2022-04-13 NOTE — Assessment & Plan Note (Addendum)
Significantly improved control.  She will continue Ozempic 0.5 mg weekly and metformin 500 mg twice daily.  Discussed that we could not repeat an A1c until it has been 3 months though she does seem to be much more improved on her glucose control.  I will send my note to her surgeon so they are aware of the improvement in her glucose control.

## 2022-04-13 NOTE — Assessment & Plan Note (Addendum)
Follow-up urine dipstick today with blood and leukocytes.  Will send for culture microscopy.

## 2022-04-13 NOTE — Assessment & Plan Note (Signed)
She will continue carvedilol 3.125 mg twice daily and spironolactone 25 mg daily.  I will check with her cardiologist to see if they want her on Farxiga and Entresto or to see if she can restart HCTZ and losartan.

## 2022-04-13 NOTE — Progress Notes (Signed)
Erika Rumps, MD Phone: [phone]  Erika Perry is a 72 y.o. female who presents today for f/u.  DIABETES Disease Monitoring: Blood Sugar ranges-110-120 Polyuria/phagia/dipsia- no      Medications: Compliance- taking ozempic, metformin Hypoglycemic symptoms- no  HYPERTENSION/CHF Disease Monitoring Home BP Monitoring not checking Chest pain- no    Dyspnea- no Medications Compliance-  taking spironolactone, coreg. Per cardiology note she is also supposed to be on farxiga and entresto though notes she has not been on those. Wonders if she needs to restart losartan and HCTZ.  Edema- no No orthopnea, PND, or weight gain.  BMET    Component Value Date/Time   NA 138 03/16/2022 1154   NA 142 04/16/2020 0951   NA 141 10/10/2012 1749   K 3.4 (L) 03/16/2022 1154   K 3.5 10/10/2012 1749   CL 101 03/16/2022 1154   CL 107 10/10/2012 1749   CO2 24 03/16/2022 1154   CO2 26 10/10/2012 1749   GLUCOSE 127 (H) 03/16/2022 1154   GLUCOSE 82 10/10/2012 1749   BUN 10 03/16/2022 1154   BUN 11 04/16/2020 0951   BUN 12 10/10/2012 1749   CREATININE 0.64 03/16/2022 1154   CREATININE 0.55 (L) 10/10/2012 1749   CALCIUM 9.2 03/16/2022 1154   CALCIUM 8.9 10/10/2012 1749   GFRNONAA >60 03/16/2022 1154   GFRNONAA >60 10/10/2012 1749   GFRAA >60 10/10/2012 1749   History of UTI: Patient presents for repeat urine check today.  She notes no dysuria, frequency, or urgency.   Social History   Tobacco Use  Smoking Status Never  Smokeless Tobacco Never    Current Outpatient Medications on File Prior to Visit  Medication Sig Dispense Refill   Blood Glucose Monitoring Suppl DEVI 1 each by Does not apply route daily before breakfast. May substitute to any manufacturer covered by patient's insurance. 1 each 0   docusate sodium (COLACE) 100 MG capsule Take 100 mg by mouth 2 (two) times daily.     Lancets Misc. MISC 1 each by Does not apply route daily. May substitute to any manufacturer covered  by patient's insurance. 100 each 0   metFORMIN (GLUCOPHAGE) 500 MG tablet Take 1 tablet (500 mg total) by mouth 2 (two) times daily with a meal. 180 tablet 3   rosuvastatin (CRESTOR) 20 MG tablet Take 1 tablet (20 mg total) by mouth daily. 90 tablet 3   Semaglutide,0.25 or 0.'5MG'$ /DOS, (OZEMPIC, 0.25 OR 0.5 MG/DOSE,) 2 MG/3ML SOPN Inject 0.25 mg into the skin once a week for 28 days, THEN 0.5 mg once a week. 3 mL 3   spironolactone (ALDACTONE) 25 MG tablet Take 1 tablet (25 mg total) by mouth daily. 30 tablet 0   [DISCONTINUED] atorvastatin (LIPITOR) 10 MG tablet Take 1 tablet (10 mg total) by mouth daily. 90 tablet 1   No current facility-administered medications on file prior to visit.     ROS see history of present illness  Objective  Physical Exam Vitals:   04/13/22 1458 04/13/22 1523  BP: (!) 142/86 (!) 152/80  Pulse: 80   Temp: 97.8 F (36.6 C)   SpO2: 98%     BP Readings from Last 3 Encounters:  04/13/22 (!) 152/80  03/16/22 118/68  03/10/22 120/70   Wt Readings from Last 3 Encounters:  04/13/22 179 lb 6.4 oz (81.4 kg)  03/16/22 179 lb 3.2 oz (81.3 kg)  03/10/22 181 lb (82.1 kg)    Physical Exam Constitutional:      General: She is  not in acute distress.    Appearance: She is not diaphoretic.  Cardiovascular:     Rate and Rhythm: Normal rate and regular rhythm.     Heart sounds: Normal heart sounds.  Pulmonary:     Effort: Pulmonary effort is normal.     Breath sounds: Normal breath sounds.  Musculoskeletal:     Right lower leg: No edema.     Left lower leg: No edema.  Skin:    General: Skin is warm and dry.  Neurological:     Mental Status: She is alert.      Assessment/Plan: Please see individual problem list.  Hypertension, unspecified type Assessment & Plan: Above goal today.  She will continue carvedilol 3.125 mg twice daily and spironolactone 25 mg daily.  I will check with her cardiologist to see if they want her on Farxiga and Entresto or to  see if she can restart HCTZ and losartan.   HFrEF (heart failure with reduced ejection fraction) ([place]) Assessment & Plan: She will continue carvedilol 3.125 mg twice daily and spironolactone 25 mg daily.  I will check with her cardiologist to see if they want her on Farxiga and Entresto or to see if she can restart HCTZ and losartan.   Uncontrolled type 2 diabetes mellitus with hyperglycemia, without long-term current use of insulin (HCC) Assessment & Plan: Significantly improved control.  She will continue Ozempic 0.5 mg weekly and metformin 500 mg twice daily.  Discussed that we could not repeat an A1c until it has been 3 months though she does seem to be much more improved on her glucose control.  I will send my note to her surgeon so they are aware of the improvement in her glucose control.  Orders: -     Microalbumin / creatinine urine ratio  Acute cystitis without hematuria Assessment & Plan: Follow-up urine dipstick today with blood and leukocytes.  Will send for culture microscopy.  Orders: -     Urine Culture -     Urine Microscopic     Health Maintenance: patient was encouraged to call an eye doctor to set up her yearly diabetic eye exam.   The patient will call if she does not hear from [place] on what her cardiologist says regarding blood pressure medications within a week.  Return for As planned.Erika Rumps, MD [place]

## 2022-04-13 NOTE — Patient Instructions (Addendum)
Nice to see you. We will see you back in May as planned. We will contact you once I hear back from your cardiologist on your medication regimen for your blood pressure. We will contact you with your urine culture and microscopy results. Please make sure you see an eye doctor once yearly for a diabetic eye exam.

## 2022-04-14 ENCOUNTER — Telehealth: Payer: Self-pay | Admitting: Family Medicine

## 2022-04-14 DIAGNOSIS — E1165 Type 2 diabetes mellitus with hyperglycemia: Secondary | ICD-10-CM

## 2022-04-14 DIAGNOSIS — I5020 Unspecified systolic (congestive) heart failure: Secondary | ICD-10-CM

## 2022-04-14 LAB — URINALYSIS, MICROSCOPIC ONLY: RBC / HPF: NONE SEEN (ref 0–?)

## 2022-04-14 LAB — MICROALBUMIN / CREATININE URINE RATIO
Creatinine,U: 95.2 mg/dL
Microalb Creat Ratio: 3.2 mg/g (ref 0.0–30.0)
Microalb, Ur: 3 mg/dL — ABNORMAL HIGH (ref 0.0–1.9)

## 2022-04-14 MED ORDER — DAPAGLIFLOZIN PROPANEDIOL 10 MG PO TABS: 10.0000 mg | ORAL_TABLET | Freq: Every day | ORAL | 1 refills | Status: DC

## 2022-04-14 MED ORDER — SACUBITRIL-VALSARTAN 24-26 MG PO TABS: 1.0000 | ORAL_TABLET | Freq: Two times a day (BID) | ORAL | 1 refills | Status: DC

## 2022-04-14 NOTE — Telephone Encounter (Signed)
Please let the patient know that I heard back from her cardiologist.  The cardiologist said the patient should be on Entresto for her blood pressure and heart failure and the [place] for her heart failure.  I will get those sent into the pharmacy for her.  Future refills should come from her cardiologist.  Patient will need labs 10 days after starting on the Entresto.  She can have her blood pressure checked within her's visit at that time as well.

## 2022-04-14 NOTE — Telephone Encounter (Signed)
-----   Message from [place], DO sent at 04/13/2022  6:51 PM EST ----- Hey,   She should be on Entresto for her BP now and farxiga for HFrEF.   Thank you,   Adi ----- Message ----- From: Leone Haven, MD Sent: 04/13/2022   3:15 PM EST To: Hebert Soho, DO  Hi Dr Daniel Nones,   I saw Mrs Blok for follow-up today. She was a bit confused on her HTN/CHF medication regimen and notes she has only been taking coreg and spironolactone. She wondered if she should restart the HCTZ and losartan she was on prior to her hospitalization. I wanted to check with you to make sure you wanted her to be on the entresto and farxiga for these issues. Or could she restart the losartan and HCTZ for her BP? Thanks for clarifying this.   Randall Hiss

## 2022-04-15 ENCOUNTER — Other Ambulatory Visit: Payer: Self-pay | Admitting: Family Medicine

## 2022-04-15 LAB — URINE CULTURE
MICRO NUMBER:: 14644961
SPECIMEN QUALITY:: ADEQUATE

## 2022-04-15 MED ORDER — NITROFURANTOIN MONOHYD MACRO 100 MG PO CAPS: 100.0000 mg | ORAL_CAPSULE | Freq: Two times a day (BID) | ORAL | 0 refills | Status: DC

## 2022-04-15 NOTE — Progress Notes (Signed)
I faxed the note over the orthopedist for the patient.  Confirmation given.  Rashi Giuliani,cma

## 2022-04-15 NOTE — Telephone Encounter (Signed)
I called and spoke with the patient and informed her what the cardiologist suggested and let her know her medications was sent to the pharmacy and she is scheduled for a lab appointment in 10 days and a BP check.  Yulia Ulrich,cma

## 2022-04-16 ENCOUNTER — Telehealth: Payer: Self-pay | Admitting: Family Medicine

## 2022-04-16 NOTE — Telephone Encounter (Signed)
Patient has a lab appt 04/24/2022, there are No orders in.

## 2022-04-17 ENCOUNTER — Telehealth: Payer: Self-pay | Admitting: Family Medicine

## 2022-04-17 NOTE — Telephone Encounter (Signed)
Patient has a lab appt on 04/24/2022, there are No orders in.

## 2022-04-20 NOTE — Telephone Encounter (Signed)
Orders have been placed.

## 2022-04-20 NOTE — Addendum Note (Signed)
Addended by: Leone Haven on: 04/20/2022 11:33 AM   Modules accepted: Orders

## 2022-04-22 ENCOUNTER — Other Ambulatory Visit: Payer: Self-pay

## 2022-04-22 DIAGNOSIS — I10 Essential (primary) hypertension: Secondary | ICD-10-CM

## 2022-04-22 DIAGNOSIS — E1165 Type 2 diabetes mellitus with hyperglycemia: Secondary | ICD-10-CM

## 2022-04-22 NOTE — Addendum Note (Signed)
Addended by: Leone Haven on: 04/22/2022 03:49 PM   Modules accepted: Orders

## 2022-04-24 ENCOUNTER — Other Ambulatory Visit (INDEPENDENT_AMBULATORY_CARE_PROVIDER_SITE_OTHER): Payer: 59

## 2022-04-24 DIAGNOSIS — E1165 Type 2 diabetes mellitus with hyperglycemia: Secondary | ICD-10-CM

## 2022-04-24 DIAGNOSIS — I10 Essential (primary) hypertension: Secondary | ICD-10-CM

## 2022-04-24 LAB — BASIC METABOLIC PANEL
BUN: 10 mg/dL (ref 6–23)
CO2: 27 mEq/L (ref 19–32)
Calcium: 9.4 mg/dL (ref 8.4–10.5)
Chloride: 102 mEq/L (ref 96–112)
Creatinine, Ser: 0.64 mg/dL (ref 0.40–1.20)
GFR: 89.29 mL/min (ref >60.00)
Glucose, Bld: 150 mg/dL — ABNORMAL HIGH (ref 70–99)
Potassium: 3.5 mEq/L (ref 3.5–5.1)
Sodium: 140 mEq/L (ref 135–145)

## 2022-04-24 NOTE — Telephone Encounter (Addendum)
Patient came in today for Labs and needed a BP check because of her medication Entresto BP was 103/59 and pulse was 77.  Patient here for nurse visit BP check per order from Dr. Caryl Bis   Patient reports compliance with prescribed BP medications: yes  Last dose of BP medication:  this morning  BP Readings from Last 3 Encounters:  04/13/22 (!) 152/80  03/16/22 118/68  03/10/22 120/70   Pulse Readings from Last 3 Encounters:  04/13/22 80  03/16/22 96  03/10/22 92    Per Dr. Caryl Bis, Patient is okay to leave.    Patient verbalized understanding of instructions.   Gordy Councilman, CMA

## 2022-04-24 NOTE — Telephone Encounter (Signed)
I did mean to route it, though it is odd that it is in a telephone note. It is usually in it's own encounter and they can see my attestation. It must have been how nina documented it.  She should continue her current medications as her BP is acceptable.

## 2022-04-27 NOTE — Telephone Encounter (Signed)
Attempted to call pt no answer, no DPR allowing detailed message.  Sent pt a mychart message notifying her of Dr. Ellen Henri message.

## 2022-05-25 ENCOUNTER — Other Ambulatory Visit
Admission: RE | Admit: 2022-05-25 | Discharge: 2022-05-25 | Disposition: A | Discharge status: Home / Self Care | Payer: 59 | Source: Ambulatory Visit | Attending: Cardiology | Admitting: Cardiology

## 2022-05-25 ENCOUNTER — Encounter: Payer: Self-pay | Admitting: Cardiology

## 2022-05-25 ENCOUNTER — Ambulatory Visit
Admission: RE | Admit: 2022-05-25 | Discharge: 2022-05-25 | Disposition: A | Discharge status: Home / Self Care | Payer: 59 | Source: Ambulatory Visit | Attending: Cardiology | Admitting: Cardiology

## 2022-05-25 ENCOUNTER — Ambulatory Visit (HOSPITAL_BASED_OUTPATIENT_CLINIC_OR_DEPARTMENT_OTHER): Payer: 59 | Admitting: Cardiology

## 2022-05-25 VITALS — BP 126/64 | HR 75 | Wt 173.0 lb

## 2022-05-25 DIAGNOSIS — I2510 Atherosclerotic heart disease of native coronary artery without angina pectoris: Secondary | ICD-10-CM | POA: Insufficient documentation

## 2022-05-25 DIAGNOSIS — I5022 Chronic systolic (congestive) heart failure: Secondary | ICD-10-CM

## 2022-05-25 DIAGNOSIS — Z7984 Long term (current) use of oral hypoglycemic drugs: Secondary | ICD-10-CM | POA: Diagnosis not present

## 2022-05-25 DIAGNOSIS — Z79899 Other long term (current) drug therapy: Secondary | ICD-10-CM | POA: Diagnosis not present

## 2022-05-25 DIAGNOSIS — E785 Hyperlipidemia, unspecified: Secondary | ICD-10-CM | POA: Diagnosis not present

## 2022-05-25 DIAGNOSIS — I428 Other cardiomyopathies: Secondary | ICD-10-CM | POA: Insufficient documentation

## 2022-05-25 DIAGNOSIS — I110 Hypertensive heart disease with heart failure: Secondary | ICD-10-CM | POA: Insufficient documentation

## 2022-05-25 DIAGNOSIS — E119 Type 2 diabetes mellitus without complications: Secondary | ICD-10-CM | POA: Diagnosis not present

## 2022-05-25 DIAGNOSIS — Z7985 Long-term (current) use of injectable non-insulin antidiabetic drugs: Secondary | ICD-10-CM | POA: Diagnosis not present

## 2022-05-25 DIAGNOSIS — I509 Heart failure, unspecified: Secondary | ICD-10-CM | POA: Diagnosis present

## 2022-05-25 LAB — BASIC METABOLIC PANEL
Anion gap: 10 (ref 5–15)
BUN: 11 mg/dL (ref 8–23)
CO2: 28 mmol/L (ref 22–32)
Calcium: 9.2 mg/dL (ref 8.9–10.3)
Chloride: 105 mmol/L (ref 98–111)
Creatinine, Ser: 0.63 mg/dL (ref 0.44–1.00)
GFR, Estimated: >60 mL/min (ref >60)
Glucose, Bld: 110 mg/dL — ABNORMAL HIGH (ref 70–99)
Potassium: 3.9 mmol/L (ref 3.5–5.1)
Sodium: 143 mmol/L (ref 135–145)

## 2022-05-25 LAB — ECHOCARDIOGRAM COMPLETE
Area-P 1/2: 4.1 cm2
Calc EF: 48.1 %
S' Lateral: 3.8 cm
Single Plane A2C EF: 47.3 %
Single Plane A4C EF: 47 %

## 2022-05-25 LAB — BRAIN NATRIURETIC PEPTIDE: B Natriuretic Peptide: 20 pg/mL (ref 0.0–100.0)

## 2022-05-25 LAB — LIPID PANEL
Cholesterol: 90 mg/dL (ref 0–200)
HDL: 33 mg/dL — ABNORMAL LOW (ref >40)
LDL Cholesterol: 34 mg/dL (ref 0–99)
Total CHOL/HDL Ratio: 2.7 RATIO
Triglycerides: 115 mg/dL (ref <150)
VLDL: 23 mg/dL (ref 0–40)

## 2022-05-25 MED ORDER — SPIRONOLACTONE 25 MG PO TABS: 12.5000 mg | ORAL_TABLET | Freq: Every day | ORAL | 3 refills | Status: DC

## 2022-05-25 NOTE — Progress Notes (Signed)
2D Echocardiogram has been performed. Mikenna Bunkley RDCS 

## 2022-05-25 NOTE — Patient Instructions (Signed)
START Spironolactone 12.5mg  daily  Routine lab work today. Will notify you of abnormal results  Repeat labs in 10days  Your provider requests you have a cardiac MRI (They will call you to schedule your appointment)  Follow up in 3 months (We will call you to schedule that appointment)  Do the following things EVERYDAY: Weigh yourself in the morning before breakfast. Write it down and keep it in a log. Take your medicines as prescribed Eat low salt foods--Limit salt (sodium) to 2000 mg per day.  Stay as active as you can everyday Limit all fluids for the day to less than 2 liters

## 2022-05-25 NOTE — Progress Notes (Signed)
PCP: Glori Luis, MD HF Cardiology: Dr. Shirlee Latch  72 y.o. with history of HTN, DM, hyperlipidemia and recently diagnosed nonischemic cardiomyopathy returns for followup of CHF. Erika Perry was admitted in 1/24 to [facility] with severe sepsis secondary to COVID-19 and UTI. During that time she had an echocardiogram demonstrating LVEF of 35 to 40%. Left heart cath with nonobstructive CAD and normal filling pressures with preserved cardiac output. GDMT was started while in hospital.    Echo today showed EF improved to 45-50%, global hypokinesis, normal RV.   She has been doing well recently.  No significant exertional dyspnea. Able to do all ADLs without significant problems.  No orthopnea/PND.  No chest pain.  No lightheadedness.  She is a nonsmoker, no ETOH or drugs.  She does not have a strong family history of cardiomyopathy.   ECG (personally reviewed): NSR, low voltage  Labs (3/24): K 3.5, creatinine 0.64  PMH: 1. HTN 2. Right shoulder arthritis 3. Type 2 diabetes 4. Hyperlipidemia 5. Chronic systolic CHF: Echo (1/24) with EF 35-40%, normal RV, mild MR.   - LHC/RHC (1/24): nonobstructive CAD; mean RA 4, PA 26/10, mean PCWP 7, CI 3.34 - Echo (4/24): EF 45-50%, global hypokinesis, normal RV, normal IVC  Social History   Socioeconomic History   Marital status: Widowed    Spouse name: Not on file   Number of children: Not on file   Years of education: Not on file   Highest education level: Not on file  Occupational History   Not on file  Tobacco Use   Smoking status: Never   Smokeless tobacco: Never  Vaping Use   Vaping Use: Never used  Substance and Sexual Activity   Alcohol use: No   Drug use: Never   Sexual activity: Not Currently  Other Topics Concern   Not on file  Social History Narrative   Not on file   Social Determinants of Health   Financial Resource Strain: Low Risk  (08/07/2021)   Overall Financial Resource Strain (CARDIA)    Difficulty of Paying Living  Expenses: Not hard at all  Food Insecurity: No Food Insecurity (03/06/2022)   Hunger Vital Sign    Worried About Running Out of Food in the Last Year: Never true    Ran Out of Food in the Last Year: Never true  Transportation Needs: No Transportation Needs (03/06/2022)   PRAPARE - Administrator, Civil Service (Medical): No    Lack of Transportation (Non-Medical): No  Physical Activity: Not on file  Stress: No Stress Concern Present (08/07/2021)   [employer] of Occupational Health - Occupational Stress Questionnaire    Feeling of Stress : Not at all  Social Connections: Unknown (08/07/2021)   Social Connection and Isolation Panel [NHANES]    Frequency of Communication with Friends and Family: More than three times a week    Frequency of Social Gatherings with Friends and Family: More than three times a week    Attends Religious Services: Not on file    Active Member of Clubs or Organizations: Not on file    Attends Banker Meetings: Not on file    Marital Status: Widowed  Intimate Partner Violence: Not At Risk (08/07/2021)   Humiliation, Afraid, Rape, and Kick questionnaire    Fear of Current or Ex-Partner: No    Emotionally Abused: No    Physically Abused: No    Sexually Abused: No   Family History  Problem Relation Age of Onset  Cancer Mother    Diabetes Mother    Hypertension Mother    Other Father        unknown medical history   ROS: All systems reviewed and negative except as per HPI.   Current Outpatient Medications  Medication Sig Dispense Refill   Blood Glucose Monitoring Suppl DEVI 1 each by Does not apply route daily before breakfast. May substitute to any manufacturer covered by patient's insurance. 1 each 0   carvedilol (COREG) 3.125 MG tablet Take 3.125 mg by mouth 2 (two) times daily with a meal.     dapagliflozin propanediol (FARXIGA) 10 MG TABS tablet Take 1 tablet (10 mg total) by mouth daily before breakfast. 30 tablet 1    Lancets Misc. MISC 1 each by Does not apply route daily. May substitute to any manufacturer covered by patient's insurance. 100 each 0   metFORMIN (GLUCOPHAGE) 500 MG tablet Take 1 tablet (500 mg total) by mouth 2 (two) times daily with a meal. 180 tablet 3   rosuvastatin (CRESTOR) 20 MG tablet Take 1 tablet (20 mg total) by mouth daily. 90 tablet 3   sacubitril-valsartan (ENTRESTO) 24-26 MG Take 1 tablet by mouth 2 (two) times daily. 60 tablet 1   Semaglutide,0.25 or 0.5MG /DOS, (OZEMPIC, 0.25 OR 0.5 MG/DOSE,) 2 MG/3ML SOPN Inject 0.25 mg into the skin once a week for 28 days, THEN 0.5 mg once a week. 3 mL 3   spironolactone (ALDACTONE) 25 MG tablet Take 0.5 tablets (12.5 mg total) by mouth daily. 45 tablet 3   No current facility-administered medications for this visit.   BP 126/64   Pulse 75   Wt 173 lb (78.5 kg)   SpO2 100%   BMI 31.64 kg/m  General: NAD Neck: No JVD, no thyromegaly or thyroid nodule.  Lungs: Clear to auscultation bilaterally with normal respiratory effort. CV: Nondisplaced PMI.  Heart regular S1/S2, no S3/S4, no murmur.  No peripheral edema.  No carotid bruit.  Normal pedal pulses.  Abdomen: Soft, nontender, no hepatosplenomegaly, no distention.  Skin: Intact without lesions or rashes.  Neurologic: Alert and oriented x 3.  Psych: Normal affect. Extremities: No clubbing or cyanosis.  HEENT: Normal.   Assessment/Plan: 1. Chronic systolic CHF: Nonischemic cardiomyopathy.  Echo in 1/24 with EF 45-50%, global hypokinesis, normal RV, normal IVC.  Cath showed nonobstructive mild CAD.  It was thought that her cardiomyopathy might be a stress cardiomyopathy in setting of COVID-19 severe sepsis.  Echo today showed improvement in EF to 45-50% but not normalization.  This leads me to concern that there could be another process present such as prior viral myocarditis or cardiomyopathy due to diabetes itself.  She is not volume overloaded on exam, NYHA class I-II.  - Continue Coreg  3.125 mg bid.  - Continue dapagliflozin 10 mg daily.  - Continue Entresto 24/26 bid.  - Add spironolactone 12.5 mg daily with BMET/BNP today and BMET in 10 days.  - I will arrange for cardiac MRI to look for infiltrative disease.  2. Hyperlipidemia: Continue Crestor, will check lipids today.  3. DM: Per PCP, she is on Farxiga, metformin, semaglutide.   Followup in 3 months.   Marca Ancona 05/25/2022

## 2022-06-15 ENCOUNTER — Telehealth: Payer: Self-pay

## 2022-06-15 DIAGNOSIS — I5020 Unspecified systolic (congestive) heart failure: Secondary | ICD-10-CM

## 2022-06-15 DIAGNOSIS — E1165 Type 2 diabetes mellitus with hyperglycemia: Secondary | ICD-10-CM

## 2022-06-15 MED ORDER — SACUBITRIL-VALSARTAN 24-26 MG PO TABS
1.0000 | ORAL_TABLET | Freq: Two times a day (BID) | ORAL | 3 refills | Status: DC
Start: 2022-06-15 — End: 2022-10-13

## 2022-06-15 MED ORDER — DAPAGLIFLOZIN PROPANEDIOL 10 MG PO TABS
10.0000 mg | ORAL_TABLET | Freq: Every day | ORAL | 3 refills | Status: DC
Start: 2022-06-15 — End: 2022-10-13

## 2022-06-15 NOTE — Telephone Encounter (Signed)
Refill request for entresto 24/26 mg and farxiga 10 mg daily. Pharmacy confirmed. Prescriptions sent to [facility] in [place] as requested.

## 2022-06-23 ENCOUNTER — Encounter: Payer: Self-pay | Admitting: Family Medicine

## 2022-06-23 ENCOUNTER — Ambulatory Visit (INDEPENDENT_AMBULATORY_CARE_PROVIDER_SITE_OTHER): Payer: 59 | Admitting: Family Medicine

## 2022-06-23 VITALS — BP 122/74 | HR 83 | Temp 98.0°F | Ht 62.0 in | Wt 170.8 lb

## 2022-06-23 DIAGNOSIS — Z7984 Long term (current) use of oral hypoglycemic drugs: Secondary | ICD-10-CM

## 2022-06-23 DIAGNOSIS — Z87440 Personal history of urinary (tract) infections: Secondary | ICD-10-CM | POA: Diagnosis not present

## 2022-06-23 DIAGNOSIS — E1165 Type 2 diabetes mellitus with hyperglycemia: Secondary | ICD-10-CM

## 2022-06-23 DIAGNOSIS — I10 Essential (primary) hypertension: Secondary | ICD-10-CM | POA: Diagnosis not present

## 2022-06-23 DIAGNOSIS — Z7985 Long-term (current) use of injectable non-insulin antidiabetic drugs: Secondary | ICD-10-CM | POA: Diagnosis not present

## 2022-06-23 DIAGNOSIS — I5020 Unspecified systolic (congestive) heart failure: Secondary | ICD-10-CM

## 2022-06-23 DIAGNOSIS — S42401D Unspecified fracture of lower end of right humerus, subsequent encounter for fracture with routine healing: Secondary | ICD-10-CM | POA: Diagnosis not present

## 2022-06-23 DIAGNOSIS — R809 Proteinuria, unspecified: Secondary | ICD-10-CM | POA: Diagnosis not present

## 2022-06-23 LAB — POCT URINALYSIS DIPSTICK
Bilirubin, UA: NEGATIVE
Glucose, UA: POSITIVE — AB
Ketones, UA: NEGATIVE
Leukocytes, UA: NEGATIVE
Nitrite, UA: NEGATIVE
Protein, UA: POSITIVE — AB
Spec Grav, UA: 1.015 (ref 1.010–1.025)
Urobilinogen, UA: 0.2 E.U./dL
pH, UA: 5 (ref 5.0–8.0)

## 2022-06-23 LAB — BASIC METABOLIC PANEL
BUN: 11 mg/dL (ref 6–23)
CO2: 30 mEq/L (ref 19–32)
Calcium: 9.6 mg/dL (ref 8.4–10.5)
Chloride: 104 mEq/L (ref 96–112)
Creatinine, Ser: 0.68 mg/dL (ref 0.40–1.20)
GFR: 87.89 mL/min (ref >60.00)
Glucose, Bld: 88 mg/dL (ref 70–99)
Potassium: 4.1 mEq/L (ref 3.5–5.1)
Sodium: 142 mEq/L (ref 135–145)

## 2022-06-23 LAB — HEMOGLOBIN A1C: Hgb A1c MFr Bld: 5.7 % (ref 4.6–6.5)

## 2022-06-23 NOTE — Assessment & Plan Note (Signed)
Chronic issue.  Patient will continue carvedilol 3.125 mg twice daily, Farxiga 10 mg daily, Entresto 1 tablet twice daily, and spironolactone 12.5 mg daily.  She will continue to follow with her heart failure specialist.

## 2022-06-23 NOTE — Assessment & Plan Note (Signed)
Check urinalysis today in preparation for orthopedic surgery.

## 2022-06-23 NOTE — Assessment & Plan Note (Signed)
Will check an A1c today and her urine.  If both of them are acceptable she could proceed with her surgery.

## 2022-06-23 NOTE — Assessment & Plan Note (Signed)
Chronic issue.  Check A1c.  Continue Ozempic 0.5 mg weekly, metformin 500 mg twice daily, and Farxiga 10 mg daily.

## 2022-06-23 NOTE — Assessment & Plan Note (Signed)
Chronic issue.  Adequately controlled.  She will continue spironolactone 12.5 mg daily, Entresto 1 tablet twice daily, carvedilol 3.125 mg twice daily.

## 2022-06-23 NOTE — Progress Notes (Signed)
Marikay Alar, MD Phone: [phone]  Erika Perry is a 72 y.o. female who presents today for f/u.  DIABETES Disease Monitoring: Blood Sugar ranges-<130 fasting Polyuria/phagia/dipsia- no      Optho- due Medications: Compliance- taking ozempic, metformin, farxiga Hypoglycemic symptoms- no  CHF: Patient is taking spironolactone, Farxiga, Coreg, and Entresto.  Notes no chest pain, shortness of breath, edema, orthopnea, or PND.  Humerus fracture: She reports her surgeon is considering doing surgery if her A1c has improved.  They also want to make sure that her UTI cleared up.   Social History   Tobacco Use  Smoking Status Never  Smokeless Tobacco Never    Current Outpatient Medications on File Prior to Visit  Medication Sig Dispense Refill   Blood Glucose Monitoring Suppl DEVI 1 each by Does not apply route daily before breakfast. May substitute to any manufacturer covered by patient's insurance. 1 each 0   carvedilol (COREG) 3.125 MG tablet Take 3.125 mg by mouth 2 (two) times daily with a meal.     dapagliflozin propanediol (FARXIGA) 10 MG TABS tablet Take 1 tablet (10 mg total) by mouth daily before breakfast. 30 tablet 3   Lancets Misc. MISC 1 each by Does not apply route daily. May substitute to any manufacturer covered by patient's insurance. 100 each 0   metFORMIN (GLUCOPHAGE) 500 MG tablet Take 1 tablet (500 mg total) by mouth 2 (two) times daily with a meal. 180 tablet 3   rosuvastatin (CRESTOR) 20 MG tablet Take 1 tablet (20 mg total) by mouth daily. 90 tablet 3   sacubitril-valsartan (ENTRESTO) 24-26 MG Take 1 tablet by mouth 2 (two) times daily. 60 tablet 3   Semaglutide,0.25 or 0.5MG /DOS, (OZEMPIC, 0.25 OR 0.5 MG/DOSE,) 2 MG/3ML SOPN Inject 0.25 mg into the skin once a week for 28 days, THEN 0.5 mg once a week. 3 mL 3   spironolactone (ALDACTONE) 25 MG tablet Take 0.5 tablets (12.5 mg total) by mouth daily. 45 tablet 3   [DISCONTINUED] atorvastatin (LIPITOR) 10 MG  tablet Take 1 tablet (10 mg total) by mouth daily. 90 tablet 1   No current facility-administered medications on file prior to visit.     ROS see history of present illness  Objective  Physical Exam Vitals:   06/23/22 0955  BP: 122/74  Pulse: 83  Temp: 98 F (36.7 C)  SpO2: 98%    BP Readings from Last 3 Encounters:  06/23/22 122/74  05/25/22 126/64  04/13/22 (!) 152/80   Wt Readings from Last 3 Encounters:  06/23/22 170 lb 12.8 oz (77.5 kg)  05/25/22 173 lb (78.5 kg)  04/13/22 179 lb 6.4 oz (81.4 kg)    Physical Exam Constitutional:      General: She is not in acute distress.    Appearance: She is not diaphoretic.  Cardiovascular:     Rate and Rhythm: Normal rate and regular rhythm.     Heart sounds: Normal heart sounds.  Pulmonary:     Effort: Pulmonary effort is normal.     Breath sounds: Normal breath sounds.  Skin:    General: Skin is warm and dry.  Neurological:     Mental Status: She is alert.    Diabetic Foot Exam - Simple   Simple Foot Form Diabetic Foot exam was performed with the following findings: Yes 06/23/2022 10:02 AM  Visual Inspection No deformities, no ulcerations, no other skin breakdown bilaterally: Yes Sensation Testing Intact to touch and monofilament testing bilaterally: Yes Pulse Check Posterior Tibialis  and Dorsalis pulse intact bilaterally: Yes Comments      Assessment/Plan: Please see individual problem list.  Hypertension, unspecified type Assessment & Plan: Chronic issue.  Adequately controlled.  She will continue spironolactone 12.5 mg daily, Entresto 1 tablet twice daily, carvedilol 3.125 mg twice daily.  Orders: -     Basic metabolic panel  Uncontrolled type 2 diabetes mellitus with hyperglycemia, without long-term current use of insulin (HCC) Assessment & Plan: Chronic issue.  Check A1c.  Continue Ozempic 0.5 mg weekly, metformin 500 mg twice daily, and Farxiga 10 mg daily.  Orders: -     Hemoglobin  A1c  HFrEF (heart failure with reduced ejection fraction) (HCC) Assessment & Plan: Chronic issue.  Patient will continue carvedilol 3.125 mg twice daily, Farxiga 10 mg daily, Entresto 1 tablet twice daily, and spironolactone 12.5 mg daily.  She will continue to follow with her heart failure specialist.  Orders: -     Basic metabolic panel  Closed fracture of distal end of right humerus with routine healing, unspecified fracture morphology, subsequent encounter Assessment & Plan: Will check an A1c today and her urine.  If both of them are acceptable she could proceed with her surgery.   History of UTI Assessment & Plan: Check urinalysis today in preparation for orthopedic surgery.  Orders: -     POCT urinalysis dipstick  Proteinuria, unspecified type -     Protein / creatinine ratio, urine     Health Maintenance: Patient was encouraged to contact her optometrist to schedule a visit for her yearly eye exam.  Return in about 3 months (around 09/23/2022) for DM.   Marikay Alar, MD [facility]

## 2022-06-24 ENCOUNTER — Other Ambulatory Visit: Payer: Self-pay | Admitting: Family Medicine

## 2022-06-24 DIAGNOSIS — E1165 Type 2 diabetes mellitus with hyperglycemia: Secondary | ICD-10-CM

## 2022-06-24 DIAGNOSIS — R809 Proteinuria, unspecified: Secondary | ICD-10-CM

## 2022-06-24 LAB — PROTEIN / CREATININE RATIO, URINE
Creatinine, Urine: 55 mg/dL (ref 20–275)
Protein/Creat Ratio: 1636 mg/g creat — ABNORMAL HIGH (ref 24–184)
Protein/Creatinine Ratio: 1.636 mg/mg creat — ABNORMAL HIGH (ref 0.024–0.184)
Total Protein, Urine: 90 mg/dL — ABNORMAL HIGH (ref 5–24)

## 2022-06-24 MED ORDER — OZEMPIC (0.25 OR 0.5 MG/DOSE) 2 MG/3ML ~~LOC~~ SOPN: 0.5000 mg | PEN_INJECTOR | SUBCUTANEOUS | 3 refills | Status: DC

## 2022-07-06 ENCOUNTER — Other Ambulatory Visit: Payer: Self-pay | Admitting: Cardiology

## 2022-07-07 ENCOUNTER — Other Ambulatory Visit: Payer: Self-pay | Admitting: *Deleted

## 2022-07-07 MED ORDER — CARVEDILOL 3.125 MG PO TABS: 3.1250 mg | ORAL_TABLET | Freq: Two times a day (BID) | ORAL | 6 refills | Status: DC

## 2022-07-21 ENCOUNTER — Other Ambulatory Visit: Payer: Self-pay | Admitting: Family Medicine

## 2022-07-21 DIAGNOSIS — E1165 Type 2 diabetes mellitus with hyperglycemia: Secondary | ICD-10-CM

## 2022-07-27 ENCOUNTER — Telehealth: Payer: Self-pay | Admitting: Family Medicine

## 2022-07-27 NOTE — Telephone Encounter (Signed)
Copied from CRM [id number]. Topic: Medicare AWV >> Jul 27, 2022  3:26 PM Payton Doughty wrote: Reason for CRM: LM 07/27/2022 to schedule AWV   Verlee Rossetti; Care Guide Ambulatory Clinical Support [place] [facility] Direct Dial: [phone]

## 2022-08-19 DIAGNOSIS — H2513 Age-related nuclear cataract, bilateral: Secondary | ICD-10-CM | POA: Diagnosis not present

## 2022-08-19 DIAGNOSIS — E119 Type 2 diabetes mellitus without complications: Secondary | ICD-10-CM | POA: Diagnosis not present

## 2022-08-19 LAB — HM DIABETES EYE EXAM

## 2022-08-25 ENCOUNTER — Encounter: Payer: Self-pay | Admitting: Family Medicine

## 2022-09-09 ENCOUNTER — Encounter (INDEPENDENT_AMBULATORY_CARE_PROVIDER_SITE_OTHER): Payer: Self-pay

## 2022-09-23 ENCOUNTER — Encounter: Payer: Self-pay | Admitting: Family Medicine

## 2022-09-23 ENCOUNTER — Ambulatory Visit (INDEPENDENT_AMBULATORY_CARE_PROVIDER_SITE_OTHER): Payer: 59 | Admitting: Family Medicine

## 2022-09-23 VITALS — BP 122/74 | HR 96 | Temp 97.6°F | Ht 62.0 in | Wt 166.0 lb

## 2022-09-23 DIAGNOSIS — S42401D Unspecified fracture of lower end of right humerus, subsequent encounter for fracture with routine healing: Secondary | ICD-10-CM

## 2022-09-23 DIAGNOSIS — I5020 Unspecified systolic (congestive) heart failure: Secondary | ICD-10-CM | POA: Diagnosis not present

## 2022-09-23 DIAGNOSIS — Z7985 Long-term (current) use of injectable non-insulin antidiabetic drugs: Secondary | ICD-10-CM

## 2022-09-23 DIAGNOSIS — I10 Essential (primary) hypertension: Secondary | ICD-10-CM | POA: Diagnosis not present

## 2022-09-23 DIAGNOSIS — Z7984 Long term (current) use of oral hypoglycemic drugs: Secondary | ICD-10-CM

## 2022-09-23 DIAGNOSIS — E119 Type 2 diabetes mellitus without complications: Secondary | ICD-10-CM | POA: Diagnosis not present

## 2022-09-23 LAB — POCT GLYCOSYLATED HEMOGLOBIN (HGB A1C): Hemoglobin A1C: 5 % (ref 4.0–5.6)

## 2022-09-23 NOTE — Assessment & Plan Note (Signed)
Chronic issue.  Adequately controlled.  Patient will continue spironolactone 12.5 mg daily, Entresto 1 tablet twice daily, carvedilol 3.125 mg twice daily.

## 2022-09-23 NOTE — Patient Instructions (Signed)
Nice to see you.  Please call the heart failure clinic at [facility] to schedule follow-up for your heart failure.  They wanted to see you back sometime in July.

## 2022-09-23 NOTE — Assessment & Plan Note (Signed)
Status post surgery.  She will continue to follow with orthopedics and PT.

## 2022-09-23 NOTE — Assessment & Plan Note (Signed)
Chronic issue.  Adequately controlled.  Patient will continue Ozempic 0.5 mg weekly, metformin 500 mg twice daily, and Farxiga 10 mg daily.

## 2022-09-23 NOTE — Assessment & Plan Note (Addendum)
Chronic issue.  Stable.  Patient will continue carvedilol 3.125 mg twice daily, Entresto 1 tablet twice daily, spironolactone 12.5 mg daily, and Farxiga 10 mg daily.  Patient was encouraged to contact the heart failure clinic to schedule follow-up.

## 2022-09-23 NOTE — Progress Notes (Signed)
Marikay Alar, MD Phone: [phone]  Erika Perry is a 72 y.o. female who presents today for f/u.  DIABETES Disease Monitoring: Blood Sugar ranges-100-125 Polyuria/phagia/dipsia- no      Optho- UTD Medications: Compliance- taking ozempic, metformin, farxiga Hypoglycemic symptoms- no  Hypertension/heart failure: Patient is taking carvedilol, Farxiga, and Entresto, and spironolactone.  No chest pain, shortness of breath, edema, orthopnea, or PND.  Humerus fracture: Patient notes she underwent surgery.  She is slowly improving.  She is doing PT.  She is no longer taking pain medication.   Social History   Tobacco Use  Smoking Status Never  Smokeless Tobacco Never    Current Outpatient Medications on File Prior to Visit  Medication Sig Dispense Refill   Blood Glucose Monitoring Suppl DEVI 1 each by Does not apply route daily before breakfast. May substitute to any manufacturer covered by patient's insurance. 1 each 0   carvedilol (COREG) 3.125 MG tablet Take 1 tablet (3.125 mg total) by mouth 2 (two) times daily with a meal. 60 tablet 6   dapagliflozin propanediol (FARXIGA) 10 MG TABS tablet Take 1 tablet (10 mg total) by mouth daily before breakfast. 30 tablet 3   Lancets Misc. MISC 1 each by Does not apply route daily. May substitute to any manufacturer covered by patient's insurance. 100 each 0   metFORMIN (GLUCOPHAGE) 500 MG tablet Take 1 tablet (500 mg total) by mouth 2 (two) times daily with a meal. 180 tablet 3   rosuvastatin (CRESTOR) 20 MG tablet Take 1 tablet (20 mg total) by mouth daily. 90 tablet 3   sacubitril-valsartan (ENTRESTO) 24-26 MG Take 1 tablet by mouth 2 (two) times daily. 60 tablet 3   Semaglutide,0.25 or 0.5MG /DOS, (OZEMPIC, 0.25 OR 0.5 MG/DOSE,) 2 MG/3ML SOPN INJECT 0.25 MG INTO THE SKIN ONCE A WEEK FOR 28 DAYS, THEN 0.5 MG EVERY WEEK 3 mL 3   spironolactone (ALDACTONE) 25 MG tablet Take 0.5 tablets (12.5 mg total) by mouth daily. 45 tablet 3    [DISCONTINUED] atorvastatin (LIPITOR) 10 MG tablet Take 1 tablet (10 mg total) by mouth daily. 90 tablet 1   No current facility-administered medications on file prior to visit.     ROS see history of present illness  Objective  Physical Exam Vitals:   09/23/22 1414  BP: 120/80  Pulse: 96  Temp: 97.6 F (36.4 C)  SpO2: 97%    BP Readings from Last 3 Encounters:  09/23/22 120/80  06/23/22 122/74  05/25/22 126/64   Wt Readings from Last 3 Encounters:  09/23/22 166 lb (75.3 kg)  06/23/22 170 lb 12.8 oz (77.5 kg)  05/25/22 173 lb (78.5 kg)    Physical Exam Constitutional:      General: She is not in acute distress.    Appearance: She is not diaphoretic.  Cardiovascular:     Rate and Rhythm: Normal rate and regular rhythm.     Heart sounds: Normal heart sounds.  Pulmonary:     Effort: Pulmonary effort is normal.     Breath sounds: Normal breath sounds.  Musculoskeletal:     Right lower leg: No edema.     Left lower leg: No edema.  Skin:    General: Skin is warm and dry.  Neurological:     Mental Status: She is alert.      Assessment/Plan: Please see individual problem list.  Hypertension, unspecified type Assessment & Plan: Chronic issue.  Adequately controlled.  Patient will continue spironolactone 12.5 mg daily, Entresto 1 tablet twice  daily, carvedilol 3.125 mg twice daily.   HFrEF (heart failure with reduced ejection fraction) (HCC) Assessment & Plan: Chronic issue.  Stable.  Patient will continue carvedilol 3.125 mg twice daily, Entresto 1 tablet twice daily, spironolactone 12.5 mg daily, and Farxiga 10 mg daily.  Patient was encouraged to contact the heart failure clinic to schedule follow-up.   Controlled type 2 diabetes mellitus without complication, without long-term current use of insulin (HCC) Assessment & Plan: Chronic issue.  Adequately controlled.  Patient will continue Ozempic 0.5 mg weekly, metformin 500 mg twice daily, and Farxiga 10 mg  daily.  Orders: -     POCT glycosylated hemoglobin (Hb A1C)  Closed fracture of distal end of right humerus with routine healing, unspecified fracture morphology, subsequent encounter Assessment & Plan: Status post surgery.  She will continue to follow with orthopedics and PT.      Return in about 6 months (around 03/26/2023).   Marikay Alar, MD [facility]

## 2022-10-12 ENCOUNTER — Other Ambulatory Visit: Payer: Self-pay | Admitting: Family Medicine

## 2022-10-12 DIAGNOSIS — E1165 Type 2 diabetes mellitus with hyperglycemia: Secondary | ICD-10-CM

## 2022-10-13 ENCOUNTER — Other Ambulatory Visit: Payer: Self-pay | Admitting: Cardiology

## 2022-10-13 DIAGNOSIS — E1165 Type 2 diabetes mellitus with hyperglycemia: Secondary | ICD-10-CM

## 2022-10-13 DIAGNOSIS — I5020 Unspecified systolic (congestive) heart failure: Secondary | ICD-10-CM

## 2022-10-14 MED ORDER — LANCETS MISC. MISC: 1.0000 | Freq: Every day | 0 refills | Status: AC

## 2022-10-14 MED ORDER — BLOOD GLUCOSE MONITORING SUPPL DEVI: 1.0000 | Freq: Every day | 0 refills | Status: AC

## 2022-10-15 ENCOUNTER — Telehealth: Payer: Self-pay | Admitting: Family Medicine

## 2022-10-15 NOTE — Telephone Encounter (Signed)
Copied from CRM [id number]. Topic: Medicare AWV >> Oct 15, 2022 11:09 AM Payton Doughty wrote: Reason for CRM: LM 10/15/2022 to schedule AWV   Verlee Rossetti; Care Guide Ambulatory Clinical Support [place] [facility] Direct Dial: [phone]

## 2022-10-16 ENCOUNTER — Telehealth: Payer: Self-pay | Admitting: Family Medicine

## 2022-10-16 DIAGNOSIS — E119 Type 2 diabetes mellitus without complications: Secondary | ICD-10-CM

## 2022-10-16 MED ORDER — ACCU-CHEK GUIDE VI STRP: ORAL_STRIP | 12 refills | Status: AC

## 2022-10-16 NOTE — Telephone Encounter (Signed)
Spoke to pt, sent accu test guide test strips in to [facility]. Informed pt if this was the in correct strips to have pharmacy send [place] a request with the correct one. Pt verbalized understanding

## 2022-10-16 NOTE — Telephone Encounter (Signed)
Prescription Request  10/16/2022  LOV: 09/23/2022  What is the name of the medication or equipment? Test strips  Have you contacted your pharmacy to request a refill? No   Which pharmacy would you like this sent to?  [id number] [place], [place] - [address] AT [facility] [address] [place] [address] Phone: [phone] Fax: [phone]    Patient notified that their request is being sent to the clinical staff for review and that they should receive a response within 2 business days.   Please advise at Mobile [phone] (mobile)

## 2022-12-03 ENCOUNTER — Telehealth: Payer: Self-pay | Admitting: Family Medicine

## 2022-12-03 NOTE — Telephone Encounter (Signed)
Copied from CRM [id number]. Topic: Medicare AWV >> Dec 03, 2022 10:11 AM Payton Doughty wrote: Reason for CRM: Called LVM 12/03/18 24 to schedule Annual Wellness Visit  Verlee Rossetti; Care Guide Ambulatory Clinical Support [place] [facility] Direct Dial: [phone]

## 2022-12-28 ENCOUNTER — Other Ambulatory Visit: Payer: Self-pay | Admitting: Family Medicine

## 2022-12-28 DIAGNOSIS — E785 Hyperlipidemia, unspecified: Secondary | ICD-10-CM

## 2023-01-02 ENCOUNTER — Other Ambulatory Visit: Payer: Self-pay | Admitting: Family Medicine

## 2023-01-13 DIAGNOSIS — M898X9 Other specified disorders of bone, unspecified site: Secondary | ICD-10-CM | POA: Diagnosis not present

## 2023-02-01 ENCOUNTER — Other Ambulatory Visit: Payer: Self-pay | Admitting: Cardiology

## 2023-03-23 ENCOUNTER — Telehealth: Payer: Self-pay | Admitting: Family Medicine

## 2023-03-23 NOTE — Telephone Encounter (Signed)
Copied from CRM [id number]. Topic: Clinical - Medication Refill >> Mar 23, 2023  8:35 AM Maxwell Marion wrote: Most Recent Primary Care Visit:  Provider: Birdie Sons ERIC G  Department: [facility]  Visit Type: OFFICE VISIT  Date: 09/23/2022  Medication: losartan (COZAAR) 50 MG tablet hydrochlorothiazide (HYDRODIURIL) tablet 12.5 mg   Has the patient contacted their pharmacy? Yes, patient says she was unable to get medications filled (Agent: If no, request that the patient contact the pharmacy for the refill. If patient does not wish to contact the pharmacy document the reason why and proceed with request.) (Agent: If yes, when and what did the pharmacy advise?)  Is this the correct pharmacy for this prescription? Yes If no, delete pharmacy and type the correct one.  This is the patient's preferred pharmacy:  [id number] - [place], [place] - [address] AT [facility] OF [address] [place] [address] Phone: [phone] Fax: [phone]   Has the prescription been filled recently? No  Is the patient out of the medication? No  Has the patient been seen for an appointment in the last year OR does the patient have an upcoming appointment? Yes  Can we respond through MyChart? Yes  Agent: Please be advised that Rx refills may take up to 3 business days. We ask that you follow-up with your pharmacy.

## 2023-03-23 NOTE — Telephone Encounter (Signed)
Spoke with pt and she stated that she has not been taking these medication because she was prescribed other medications by her heart doctor. The other medications were Spironolactone, Marcelline Deist, Entresto, and Carvedilol. Pt stated that since she has stopped al of those medications she wanted to go back on her Losartan 50 mg daily and the hydrochlorothiazide 12.5 mg daily. I asked pt who stopped all of those medications and she stated that she did because she never went back to see cardiology because she didn't feel like she needed to see cardiology any more.

## 2023-03-23 NOTE — Telephone Encounter (Signed)
Please call the patient and confirm she is taking these and confirm the dosing. Thanks.

## 2023-03-24 NOTE — Telephone Encounter (Signed)
If she should still be following up with the heart failure clinic and cardiology.  She should still be on the spironolactone, Erika Perry, Entresto, and carvedilol.  How long ago did she stop these medications?  She also needs follow-up with our clinic.  Please find out what her blood pressure has been running.  I would like to know this prior to restarting all of these medications.

## 2023-03-24 NOTE — Telephone Encounter (Signed)
Left message for patient to give our office a call back to discuss Dr Purvis Sheffield recommendations.   OK to relay message if patient calls back. If relayed, please notify the office.

## 2023-03-26 ENCOUNTER — Ambulatory Visit (INDEPENDENT_AMBULATORY_CARE_PROVIDER_SITE_OTHER): Payer: 59 | Admitting: Nurse Practitioner

## 2023-03-26 ENCOUNTER — Ambulatory Visit: Payer: 59 | Admitting: Family Medicine

## 2023-03-26 ENCOUNTER — Encounter: Payer: Self-pay | Admitting: Nurse Practitioner

## 2023-03-26 VITALS — BP 120/78 | HR 82 | Temp 97.9°F | Ht 62.0 in | Wt 174.8 lb

## 2023-03-26 DIAGNOSIS — E119 Type 2 diabetes mellitus without complications: Secondary | ICD-10-CM

## 2023-03-26 DIAGNOSIS — Z1329 Encounter for screening for other suspected endocrine disorder: Secondary | ICD-10-CM

## 2023-03-26 DIAGNOSIS — E559 Vitamin D deficiency, unspecified: Secondary | ICD-10-CM

## 2023-03-26 DIAGNOSIS — Z7984 Long term (current) use of oral hypoglycemic drugs: Secondary | ICD-10-CM | POA: Diagnosis not present

## 2023-03-26 DIAGNOSIS — I10 Essential (primary) hypertension: Secondary | ICD-10-CM | POA: Diagnosis not present

## 2023-03-26 DIAGNOSIS — E785 Hyperlipidemia, unspecified: Secondary | ICD-10-CM

## 2023-03-26 DIAGNOSIS — Z7985 Long-term (current) use of injectable non-insulin antidiabetic drugs: Secondary | ICD-10-CM | POA: Diagnosis not present

## 2023-03-26 DIAGNOSIS — I5020 Unspecified systolic (congestive) heart failure: Secondary | ICD-10-CM

## 2023-03-26 NOTE — Assessment & Plan Note (Signed)
Two weeks ago, the patient self-discontinued spironolactone, Entresto, Coreg, and [place]. Currently, there are no symptoms of heart failure. The importance of these medications in managing heart failure and the risks of discontinuation were discussed. Recommend restarting spironolactone, Entresto, Coreg, and [place]. Strongly advise follow-up with cardiology for further evaluation and management.

## 2023-03-26 NOTE — Assessment & Plan Note (Signed)
Diabetes is well controlled on Ozempic and metformin. Continue the current regimen. We will check A1c today.

## 2023-03-26 NOTE — Assessment & Plan Note (Signed)
The patient is on Crestor with no reported side effects. Continue Crestor. We will check lipid panel today.

## 2023-03-26 NOTE — Progress Notes (Signed)
 Bethanie Dicker, NP-C Phone: [phone]  Erika Perry is a 73 y.o. female who presents today for transfer of care.   Discussed the use of AI scribe software for clinical note transcription with the patient, who gave verbal consent to proceed.  History of Present Illness   Erika Perry is a 73 year old female with congestive heart failure who presents for transfer of care.  She has a history of congestive heart failure, diagnosed after a hospitalization for a urinary tract infection and COVID-19 last year, during which she experienced heart failure. She was treated by cardiology and started on spironolactone, Entresto, Coreg, and Farxiga, which improved her condition. However, she stopped taking these medications about two weeks ago, believing her heart was 'perfectly fine'.  No side effects from the medications she was previously on, including spironolactone, Entresto, Coreg, and [place]. She has not been checking her blood pressure at home and denies any symptoms such as chest pain, shortness of breath, dizziness, or swelling. She also reports no excessive thirst, excessive urination, or low blood sugars. She continues to take Ozempic and metformin for diabetes management, and Crestor for cholesterol, without any abdominal pain or muscle aches.  She has not followed up with cardiology since last year and was supposed to make an appointment but did not. She is not currently checking her blood sugar daily.      Social History   Tobacco Use  Smoking Status Never  Smokeless Tobacco Never    Current Outpatient Medications on File Prior to Visit  Medication Sig Dispense Refill   Blood Glucose Monitoring Suppl DEVI 1 each by Does not apply route daily before breakfast. May substitute to any manufacturer covered by patient's insurance. 1 each 0   glucose blood (ACCU-CHEK GUIDE) test strip 1 each daily before breakfast. May substitute to any manufacturer covered by patient's insurance. -  Does not apply 100 each 12   Lancets Misc. MISC 1 each by Does not apply route daily. May substitute to any manufacturer covered by patient's insurance. 100 each 0   metFORMIN (GLUCOPHAGE) 500 MG tablet TAKE 1 TABLET(500 MG) BY MOUTH TWICE DAILY WITH A MEAL 180 tablet 3   rosuvastatin (CRESTOR) 20 MG tablet TAKE 1 TABLET(20 MG) BY MOUTH DAILY 90 tablet 3   Semaglutide,0.25 or 0.5MG /DOS, (OZEMPIC, 0.25 OR 0.5 MG/DOSE,) 2 MG/3ML SOPN INJECT 0.25 MG INTO THE SKIN ONCE A WEEK FOR 28 DAYS, THEN 0.5 MG EVERY WEEK 3 mL 3   carvedilol (COREG) 3.125 MG tablet TAKE 1 TABLET(3.125 MG) BY MOUTH TWICE DAILY WITH A MEAL 60 tablet 6   ENTRESTO 24-26 MG TAKE 1 TABLET BY MOUTH TWICE DAILY 60 tablet 3   FARXIGA 10 MG TABS tablet TAKE 1 TABLET(10 MG) BY MOUTH DAILY BEFORE BREAKFAST 30 tablet 3   spironolactone (ALDACTONE) 25 MG tablet Take 0.5 tablets (12.5 mg total) by mouth daily. 45 tablet 3   [DISCONTINUED] atorvastatin (LIPITOR) 10 MG tablet Take 1 tablet (10 mg total) by mouth daily. 90 tablet 1   No current facility-administered medications on file prior to visit.     ROS see history of present illness  Objective  Physical Exam Vitals:   03/26/23 1354  BP: 120/78  Pulse: 82  Temp: 97.9 F (36.6 C)  SpO2: 96%    BP Readings from Last 3 Encounters:  03/26/23 120/78  09/23/22 122/74  06/23/22 122/74   Wt Readings from Last 3 Encounters:  03/26/23 174 lb 12.8 oz (79.3 kg)  09/23/22  166 lb (75.3 kg)  06/23/22 170 lb 12.8 oz (77.5 kg)    Physical Exam Constitutional:      General: She is not in acute distress.    Appearance: Normal appearance.  HENT:     Head: Normocephalic.  Cardiovascular:     Rate and Rhythm: Normal rate and regular rhythm.     Heart sounds: Normal heart sounds.  Pulmonary:     Effort: Pulmonary effort is normal.     Breath sounds: Normal breath sounds.  Skin:    General: Skin is warm and dry.  Neurological:     General: No focal deficit present.     Mental  Status: She is alert.  Psychiatric:        Mood and Affect: Mood normal.        Behavior: Behavior normal.    Assessment/Plan: Please see individual problem list.  HFrEF (heart failure with reduced ejection fraction) (HCC) Assessment & Plan: Two weeks ago, the patient self-discontinued spironolactone, Entresto, Coreg, and [place]. Currently, there are no symptoms of heart failure. The importance of these medications in managing heart failure and the risks of discontinuation were discussed. Recommend restarting spironolactone, Entresto, Coreg, and [place]. Strongly advise follow-up with cardiology for further evaluation and management.   Hypertension, unspecified type Assessment & Plan: Patient self-discontinued her blood pressure and heart failure medications two weeks ago. The importance of these medications and the risks of discontinuation were discussed. Recommend restarting spironolactone, Entresto, Coreg, and [place]. Strongly advise follow-up with cardiology. Counseled on symptoms of hypotension. She will return in 2 weeks for a blood pressure check with nursing and BMP.   Orders: -     CBC with Differential/Platelet -     Comprehensive metabolic panel -     Basic metabolic panel; Future  Controlled type 2 diabetes mellitus without complication, without long-term current use of insulin (HCC) Assessment & Plan: Diabetes is well controlled on Ozempic and metformin. Continue the current regimen. We will check A1c today.   Orders: -     Hemoglobin A1c  Hyperlipidemia, unspecified hyperlipidemia type Assessment & Plan: The patient is on Crestor with no reported side effects. Continue Crestor. We will check lipid panel today.  Orders: -     Lipid panel  Vitamin D deficiency -     VITAMIN D 25 Hydroxy (Vit-D Deficiency, Fractures)  Thyroid disorder screen -     TSH    Return in about 2 weeks (around 04/09/2023) for Blood pressure check with nursing then 6 month follow  up.   Bethanie Dicker, NP-C [facility]

## 2023-03-26 NOTE — Assessment & Plan Note (Addendum)
Patient self-discontinued her blood pressure and heart failure medications two weeks ago. The importance of these medications and the risks of discontinuation were discussed. Recommend restarting spironolactone, Entresto, Coreg, and [place]. Strongly advise follow-up with cardiology. Counseled on symptoms of hypotension. She will return in 2 weeks for a blood pressure check with nursing and BMP.

## 2023-03-27 LAB — CBC WITH DIFFERENTIAL/PLATELET
Basophils Absolute: 0 10*3/uL (ref 0.0–0.2)
Basos: 0 %
EOS (ABSOLUTE): 0 10*3/uL (ref 0.0–0.4)
Eos: 0 %
Hematocrit: 40.7 % (ref 34.0–46.6)
Hemoglobin: 12.9 g/dL (ref 11.1–15.9)
Immature Grans (Abs): 0 10*3/uL (ref 0.0–0.1)
Immature Granulocytes: 0 %
Lymphocytes Absolute: 1.6 10*3/uL (ref 0.7–3.1)
Lymphs: 31 %
MCH: 27.7 pg (ref 26.6–33.0)
MCHC: 31.7 g/dL (ref 31.5–35.7)
MCV: 88 fL (ref 79–97)
Monocytes Absolute: 0.6 10*3/uL (ref 0.1–0.9)
Monocytes: 11 %
Neutrophils Absolute: 2.9 10*3/uL (ref 1.4–7.0)
Neutrophils: 58 %
Platelets: 153 10*3/uL (ref 150–450)
RBC: 4.65 x10E6/uL (ref 3.77–5.28)
RDW: 14.2 % (ref 11.7–15.4)
WBC: 5.1 10*3/uL (ref 3.4–10.8)

## 2023-03-27 LAB — LIPID PANEL
Chol/HDL Ratio: 2.5 {ratio} (ref 0.0–4.4)
Cholesterol, Total: 117 mg/dL (ref 100–199)
HDL: 47 mg/dL (ref >39)
LDL Chol Calc (NIH): 51 mg/dL (ref 0–99)
Triglycerides: 99 mg/dL (ref 0–149)
VLDL Cholesterol Cal: 19 mg/dL (ref 5–40)

## 2023-03-27 LAB — COMPREHENSIVE METABOLIC PANEL
ALT: 10 [IU]/L (ref 0–32)
AST: 15 [IU]/L (ref 0–40)
Albumin: 4.3 g/dL (ref 3.8–4.8)
Alkaline Phosphatase: 76 [IU]/L (ref 44–121)
BUN/Creatinine Ratio: 23 (ref 12–28)
BUN: 15 mg/dL (ref 8–27)
Bilirubin Total: 0.8 mg/dL (ref 0.0–1.2)
CO2: 25 mmol/L (ref 20–29)
Calcium: 9.3 mg/dL (ref 8.7–10.3)
Chloride: 103 mmol/L (ref 96–106)
Creatinine, Ser: 0.65 mg/dL (ref 0.57–1.00)
Globulin, Total: 2.4 g/dL (ref 1.5–4.5)
Glucose: 104 mg/dL — ABNORMAL HIGH (ref 70–99)
Potassium: 4.8 mmol/L (ref 3.5–5.2)
Sodium: 142 mmol/L (ref 134–144)
Total Protein: 6.7 g/dL (ref 6.0–8.5)
eGFR: 94 mL/min/{1.73_m2} (ref >59)

## 2023-03-27 LAB — HEMOGLOBIN A1C
Est. average glucose Bld gHb Est-mCnc: 108 mg/dL
Hgb A1c MFr Bld: 5.4 % (ref 4.8–5.6)

## 2023-03-27 LAB — VITAMIN D 25 HYDROXY (VIT D DEFICIENCY, FRACTURES): Vit D, 25-Hydroxy: 12.3 ng/mL — ABNORMAL LOW (ref 30.0–100.0)

## 2023-03-27 LAB — TSH: TSH: 2.73 u[IU]/mL (ref 0.450–4.500)

## 2023-03-30 ENCOUNTER — Other Ambulatory Visit: Payer: Self-pay | Admitting: Nurse Practitioner

## 2023-03-30 DIAGNOSIS — E559 Vitamin D deficiency, unspecified: Secondary | ICD-10-CM

## 2023-03-30 MED ORDER — VITAMIN D (ERGOCALCIFEROL) 1.25 MG (50000 UNIT) PO CAPS: 50000.0000 [IU] | ORAL_CAPSULE | ORAL | 1 refills | Status: DC

## 2023-04-08 ENCOUNTER — Ambulatory Visit: Payer: 59

## 2023-04-08 ENCOUNTER — Encounter: Payer: Self-pay | Admitting: Nurse Practitioner

## 2023-04-08 ENCOUNTER — Other Ambulatory Visit (INDEPENDENT_AMBULATORY_CARE_PROVIDER_SITE_OTHER): Payer: 59

## 2023-04-08 DIAGNOSIS — I10 Essential (primary) hypertension: Secondary | ICD-10-CM

## 2023-04-08 LAB — BASIC METABOLIC PANEL
BUN: 14 mg/dL (ref 6–23)
CO2: 30 meq/L (ref 19–32)
Calcium: 9.3 mg/dL (ref 8.4–10.5)
Chloride: 104 meq/L (ref 96–112)
Creatinine, Ser: 0.68 mg/dL (ref 0.40–1.20)
GFR: 87.4 mL/min (ref >60.00)
Glucose, Bld: 89 mg/dL (ref 70–99)
Potassium: 4.5 meq/L (ref 3.5–5.1)
Sodium: 141 meq/L (ref 135–145)

## 2023-04-30 DIAGNOSIS — H2513 Age-related nuclear cataract, bilateral: Secondary | ICD-10-CM | POA: Diagnosis not present

## 2023-04-30 DIAGNOSIS — H2512 Age-related nuclear cataract, left eye: Secondary | ICD-10-CM | POA: Diagnosis not present

## 2023-04-30 DIAGNOSIS — E119 Type 2 diabetes mellitus without complications: Secondary | ICD-10-CM | POA: Diagnosis not present

## 2023-04-30 DIAGNOSIS — I10 Essential (primary) hypertension: Secondary | ICD-10-CM | POA: Diagnosis not present

## 2023-04-30 DIAGNOSIS — H02831 Dermatochalasis of right upper eyelid: Secondary | ICD-10-CM | POA: Diagnosis not present

## 2023-05-01 ENCOUNTER — Other Ambulatory Visit: Payer: Self-pay | Admitting: Cardiology

## 2023-05-31 ENCOUNTER — Ambulatory Visit (INDEPENDENT_AMBULATORY_CARE_PROVIDER_SITE_OTHER): Admitting: *Deleted

## 2023-05-31 VITALS — Ht 62.0 in | Wt 174.0 lb

## 2023-05-31 DIAGNOSIS — Z0000 Encounter for general adult medical examination without abnormal findings: Secondary | ICD-10-CM

## 2023-05-31 NOTE — Progress Notes (Signed)
 Subjective:   Erika Perry is a 73 y.o. who presents for a Medicare Wellness preventive visit.  Visit Complete: Virtual I connected with  Erika Perry on 05/31/23 by a audio enabled telemedicine application and verified that I am speaking with the correct person using two identifiers.  Patient Location: Home  Provider Location: Office/Clinic  I discussed the limitations of evaluation and management by telemedicine. The patient expressed understanding and agreed to proceed.  Vital Signs: Because this visit was a virtual/telehealth visit, some criteria may be missing or patient reported. Any vitals not documented were not able to be obtained and vitals that have been documented are patient reported.  VideoDeclined- This patient declined Librarian, academic. Therefore the visit was completed with audio only.  Persons Participating in Visit: Patient.  AWV Questionnaire: No: Patient Medicare AWV questionnaire was not completed prior to this visit.  Cardiac Risk Factors include: advanced age (>42men, >40 women);diabetes mellitus;dyslipidemia;obesity (BMI >30kg/m2);hypertension     Objective:    Today's Vitals   05/31/23 0813  Weight: 174 lb (78.9 kg)  Height: 5\' 2"  (1.575 m)   Body mass index is 31.83 kg/m.     05/31/2023    8:23 AM 02/27/2022    6:08 PM 09/29/2021    6:41 AM 08/07/2021    8:46 AM 07/11/2021    6:29 AM 02/28/2016    3:57 PM  Advanced Directives  Does Patient Have a Medical Advance Directive? No No No No No No  Would patient like information on creating a medical advance directive? No - Patient declined  No - Patient declined No - Patient declined No - Patient declined     Current Medications (verified) Outpatient Encounter Medications as of 05/31/2023  Medication Sig   Blood Glucose Monitoring Suppl DEVI 1 each by Does not apply route daily before breakfast. May substitute to any manufacturer covered by patient's insurance.    carvedilol  (COREG ) 3.125 MG tablet TAKE 1 TABLET(3.125 MG) BY MOUTH TWICE DAILY WITH A MEAL   ENTRESTO  24-26 MG TAKE 1 TABLET BY MOUTH TWICE DAILY   FARXIGA  10 MG TABS tablet TAKE 1 TABLET(10 MG) BY MOUTH DAILY BEFORE BREAKFAST   glucose blood (ACCU-CHEK GUIDE) test strip 1 each daily before breakfast. May substitute to any manufacturer covered by patient's insurance. - Does not apply   Lancets Misc. MISC 1 each by Does not apply route daily. May substitute to any manufacturer covered by patient's insurance.   metFORMIN  (GLUCOPHAGE ) 500 MG tablet TAKE 1 TABLET(500 MG) BY MOUTH TWICE DAILY WITH A MEAL   rosuvastatin  (CRESTOR ) 20 MG tablet TAKE 1 TABLET(20 MG) BY MOUTH DAILY   Semaglutide ,0.25 or 0.5MG /DOS, (OZEMPIC , 0.25 OR 0.5 MG/DOSE,) 2 MG/3ML SOPN INJECT 0.25 MG INTO THE SKIN ONCE A WEEK FOR 28 DAYS, THEN 0.5 MG EVERY WEEK   spironolactone  (ALDACTONE ) 25 MG tablet TAKE 1/2 TABLET(12.5 MG) BY MOUTH DAILY   Vitamin D , Ergocalciferol , (DRISDOL ) 1.25 MG (50000 UNIT) CAPS capsule Take 1 capsule (50,000 Units total) by mouth every 7 (seven) days. (Patient not taking: Reported on 05/31/2023)   [DISCONTINUED] atorvastatin  (LIPITOR) 10 MG tablet Take 1 tablet (10 mg total) by mouth daily.   No facility-administered encounter medications on file as of 05/31/2023.    Allergies (verified) Latex, Bactrim [sulfamethoxazole-trimethoprim], and Penicillins   History: Past Medical History:  Diagnosis Date   BMI 35.0-35.9,adult 07/17/2020   Closed fracture of right distal humerus 07/12/2021   HLD (hyperlipidemia)    Hypertension  Pre-diabetes    Vitamin D  deficiency 07/13/2021   Past Surgical History:  Procedure Laterality Date   CESAREAN SECTION  1981, 1990   CLOSED REDUCTION ELBOW FRACTURE Right 09/29/2021   Procedure: CLOSED MANIPULATION ELBOW;  Surgeon: Laneta Pintos, MD;  Location: [facility] OR;  Service: Orthopedics;  Laterality: Right;   ORIF HUMERUS FRACTURE Right 07/11/2021   Procedure: OPEN REDUCTION  INTERNAL FIXATION (ORIF) DISTAL HUMERUS FRACTURE;  Surgeon: Laneta Pintos, MD;  Location: [facility] OR;  Service: Orthopedics;  Laterality: Right;   RIGHT/LEFT HEART CATH AND CORONARY ANGIOGRAPHY N/A 03/04/2022   Procedure: RIGHT/LEFT HEART CATH AND CORONARY ANGIOGRAPHY;  Surgeon: Wenona Hamilton, MD;  Location: [facility] INVASIVE CV LAB;  Service: Cardiovascular;  Laterality: N/A;   TONSILLECTOMY     Family History  Problem Relation Age of Onset   Cancer Mother    Diabetes Mother    Hypertension Mother    Other Father        unknown medical history   Social History   Socioeconomic History   Marital status: Widowed    Spouse name: Not on file   Number of children: Not on file   Years of education: Not on file   Highest education level: 9th grade  Occupational History   Not on file  Tobacco Use   Smoking status: Never   Smokeless tobacco: Never  Vaping Use   Vaping status: Never Used  Substance and Sexual Activity   Alcohol use: No   Drug use: Never   Sexual activity: Not Currently  Other Topics Concern   Not on file  Social History Narrative   widowed   Social Drivers of Health   Financial Resource Strain: Low Risk  (05/31/2023)   Overall Financial Resource Strain (CARDIA)    Difficulty of Paying Living Expenses: Not hard at all  Recent Concern: Financial Resource Strain - Medium Risk (03/22/2023)   Overall Financial Resource Strain (CARDIA)    Difficulty of Paying Living Expenses: Somewhat hard  Food Insecurity: No Food Insecurity (05/31/2023)   Hunger Vital Sign    Worried About Running Out of Food in the Last Year: Never true    Ran Out of Food in the Last Year: Never true  Recent Concern: Food Insecurity - Food Insecurity Present (03/22/2023)   Hunger Vital Sign    Worried About Running Out of Food in the Last Year: Never true    Ran Out of Food in the Last Year: Sometimes true  Transportation Needs: No Transportation Needs (05/31/2023)   PRAPARE - Doctor, general practice (Medical): No    Lack of Transportation (Non-Medical): No  Physical Activity: Inactive (05/31/2023)   Exercise Vital Sign    Days of Exercise per Week: 0 days    Minutes of Exercise per Session: 0 min  Stress: No Stress Concern Present (05/31/2023)   [employer] of Occupational Health - Occupational Stress Questionnaire    Feeling of Stress : Not at all  Social Connections: Moderately Isolated (05/31/2023)   Social Connection and Isolation Panel [NHANES]    Frequency of Communication with Friends and Family: Once a week    Frequency of Social Gatherings with Friends and Family: Twice a week    Attends Religious Services: More than 4 times per year    Active Member of [employer] or Organizations: No    Attends Banker Meetings: Never    Marital Status: Widowed    Tobacco Counseling Counseling given: Not Answered  Clinical Intake:  Pre-visit preparation completed: Yes  Pain : No/denies pain     BMI - recorded: 31.83 Nutritional Status: BMI > 30  Obese Nutritional Risks: None Diabetes: Yes CBG done?: Yes (FBS 117 per patient)  Lab Results  Component Value Date   HGBA1C 5.4 03/26/2023   HGBA1C 5.0 09/23/2022   HGBA1C 5.7 06/23/2022     How often do you need to have someone help you when you read instructions, pamphlets, or other written materials from your doctor or pharmacy?: 1 - Never  Interpreter Needed?: No  Information entered by :: R. Garek Schuneman LPN   Activities of Daily Living     05/31/2023    8:14 AM  In your present state of health, do you have any difficulty performing the following activities:  Hearing? 0  Vision? 0  Comment glasses  Difficulty concentrating or making decisions? 0  Walking or climbing stairs? 0  Dressing or bathing? 0  Doing errands, shopping? 0  Preparing Food and eating ? N  Using the Toilet? N  In the past six months, have you accidently leaked urine? N  Do you have problems with loss of bowel  control? N  Managing your Medications? N  Managing your Finances? N  Housekeeping or managing your Housekeeping? N    Patient Care Team: Bluford Burkitt, NP as PCP - General (Nurse Practitioner)  Indicate any recent Medical Services you may have received from other than [facility] providers in the past year (date may be approximate).     Assessment:   This is a routine wellness examination for Valeria.  Hearing/Vision screen Hearing Screening - Comments:: No issues Vision Screening - Comments:: glasses   Goals Addressed             This Visit's Progress    Patient Stated       Wants to keep herself well and stay active       Depression Screen     05/31/2023    8:19 AM 03/26/2023    1:55 PM 06/23/2022    9:57 AM 03/10/2022   11:07 AM 12/23/2021    9:24 AM 08/07/2021    8:44 AM 06/16/2021   11:34 AM  PHQ 2/9 Scores  PHQ - 2 Score 0 0 0 0 0 0 0  PHQ- 9 Score 0 0 0        Fall Risk     05/31/2023    8:16 AM 09/23/2022    2:15 PM 06/23/2022    9:56 AM 04/13/2022    3:02 PM 12/23/2021    9:24 AM  Fall Risk   Falls in the past year? 0 0 0 0 0  Number falls in past yr: 0 0 0 0 0  Injury with Fall? 0 1 0 0 0  Risk for fall due to : No Fall Risks History of fall(s) No Fall Risks No Fall Risks No Fall Risks  Follow up Falls prevention discussed;Falls evaluation completed Falls evaluation completed Falls evaluation completed Falls evaluation completed Falls evaluation completed    MEDICARE RISK AT HOME:  Medicare Risk at Home Any stairs in or around the home?: Yes If so, are there any without handrails?: No Home free of loose throw rugs in walkways, pet beds, electrical cords, etc?: Yes Adequate lighting in your home to reduce risk of falls?: Yes Life alert?: No Use of a cane, walker or w/c?: No Grab bars in the bathroom?: Yes Shower chair or bench in shower?: Yes Elevated toilet  seat or a handicapped toilet?: No  TIMED UP AND GO:  Was the test performed?  No  Cognitive  Function: 6CIT completed        05/31/2023    8:24 AM  6CIT Screen  What Year? 0 points  What month? 0 points  What time? 0 points  Count back from 20 0 points  Months in reverse 0 points  Repeat phrase 0 points  Total Score 0 points    Immunizations  There is no immunization history on file for this patient.  Screening Tests Health Maintenance  Topic Date Due   DTaP/Tdap/Td (1 - Tdap) Never done   Pneumonia Vaccine 102+ Years old (1 of 2 - PCV) Never done   MAMMOGRAM  Never done   DEXA SCAN  Never done   Diabetic kidney evaluation - Urine ACR  04/13/2023   FOOT EXAM  06/23/2023   OPHTHALMOLOGY EXAM  08/19/2023   INFLUENZA VACCINE  09/10/2023   HEMOGLOBIN A1C  09/23/2023   Diabetic kidney evaluation - eGFR measurement  04/07/2024   Medicare Annual Wellness (AWV)  05/30/2024   Hepatitis C Screening  Completed   HPV VACCINES  Aged Out   Meningococcal B Vaccine  Aged Out   Colonoscopy  Discontinued   COVID-19 Vaccine  Discontinued   Zoster Vaccines- Shingrix  Discontinued    Health Maintenance  Health Maintenance Due  Topic Date Due   DTaP/Tdap/Td (1 - Tdap) Never done   Pneumonia Vaccine 10+ Years old (1 of 2 - PCV) Never done   MAMMOGRAM  Never done   DEXA SCAN  Never done   Diabetic kidney evaluation - Urine ACR  04/13/2023   Health Maintenance Items Addressed: Patient declines all vaccines, mammogram and bone density.   Additional Screening:  Vision Screening: Recommended annual ophthalmology exams for early detection of glaucoma and other disorders of the eye. Up to date [facility]  Dental Screening: Recommended annual dental exams for proper oral hygiene  Community Resource Referral / Chronic Care Management: CRR required this visit?  No   CCM required this visit?  No     Plan:     I have personally reviewed and noted the following in the patient's chart:   Medical and social history Use of alcohol, tobacco or illicit drugs  Current  medications and supplements including opioid prescriptions. Patient is not currently taking opioid prescriptions. Functional ability and status Nutritional status Physical activity Advanced directives List of other physicians Hospitalizations, surgeries, and ER visits in previous 12 months Vitals Screenings to include cognitive, depression, and falls Referrals and appointments  In addition, I have reviewed and discussed with patient certain preventive protocols, quality metrics, and best practice recommendations. A written personalized care plan for preventive services as well as general preventive health recommendations were provided to patient.     Felicitas Horse, LPN   1/61/0960   After Visit Summary: (MyChart) Due to this being a telephonic visit, the after visit summary with patients personalized plan was offered to patient via MyChart   Notes: Nothing significant to report at this time.

## 2023-05-31 NOTE — Patient Instructions (Signed)
 Ms. Jupin , Thank you for taking time to come for your Medicare Wellness Visit. I appreciate your ongoing commitment to your health goals. Please review the following plan we discussed and let me know if I can assist you in the future.   Referrals/Orders/Follow-Ups/Clinician Recommendations: Consider updating your vaccines and having a mammogram and bone density.  This is a list of the screening recommended for you and due dates:  Health Maintenance  Topic Date Due   DTaP/Tdap/Td vaccine (1 - Tdap) Never done   Pneumonia Vaccine (1 of 2 - PCV) Never done   Mammogram  Never done   DEXA scan (bone density measurement)  Never done   Yearly kidney health urinalysis for diabetes  04/13/2023   Complete foot exam   06/23/2023   Eye exam for diabetics  08/19/2023   Flu Shot  09/10/2023   Hemoglobin A1C  09/23/2023   Yearly kidney function blood test for diabetes  04/07/2024   Medicare Annual Wellness Visit  05/30/2024   Hepatitis C Screening  Completed   HPV Vaccine  Aged Out   Meningitis B Vaccine  Aged Out   Colon Cancer Screening  Discontinued   COVID-19 Vaccine  Discontinued   Zoster (Shingles) Vaccine  Discontinued    Advanced directives: (Declined) Advance directive discussed with you today. Even though you declined this today, please call our office should you change your mind, and we can give you the proper paperwork for you to fill out.  Next Medicare Annual Wellness Visit scheduled for next year: Yes 06/02/24 @ 8:50

## 2023-07-15 DIAGNOSIS — H2512 Age-related nuclear cataract, left eye: Secondary | ICD-10-CM | POA: Diagnosis not present

## 2023-07-16 DIAGNOSIS — H25011 Cortical age-related cataract, right eye: Secondary | ICD-10-CM | POA: Diagnosis not present

## 2023-07-16 DIAGNOSIS — H2511 Age-related nuclear cataract, right eye: Secondary | ICD-10-CM | POA: Diagnosis not present

## 2023-07-16 DIAGNOSIS — H25041 Posterior subcapsular polar age-related cataract, right eye: Secondary | ICD-10-CM | POA: Diagnosis not present

## 2023-07-22 DIAGNOSIS — H2512 Age-related nuclear cataract, left eye: Secondary | ICD-10-CM | POA: Diagnosis not present

## 2023-07-26 ENCOUNTER — Telehealth: Payer: Self-pay | Admitting: Nurse Practitioner

## 2023-07-26 DIAGNOSIS — E1165 Type 2 diabetes mellitus with hyperglycemia: Secondary | ICD-10-CM

## 2023-07-26 NOTE — Telephone Encounter (Unsigned)
 Copied from CRM [id number]. Topic: Clinical - Medication Refill >> Jul 26, 2023  8:35 AM Kita Perish H wrote: Medication: Semaglutide ,0.25 or 0.5MG /DOS, (OZEMPIC , 0.25  Has the patient contacted their pharmacy? No, prescription says no refills (Agent: If no, request that the patient contact the pharmacy for the refill. If patient does not wish to contact the pharmacy document the reason why and proceed with request.) (Agent: If yes, when and what did the pharmacy advise?)  This is the patient's preferred pharmacy:  [id number] [place], [place] - [address] AT [facility] OF [address] [place] [address] Phone: [phone] Fax: [phone]  Is this the correct pharmacy for this prescription? Yes If no, delete pharmacy and type the correct one.   Has the prescription been filled recently? No  Is the patient out of the medication? Yes  Has the patient been seen for an appointment in the last year OR does the patient have an upcoming appointment? Yes  Can we respond through MyChart? Yes  Agent: Please be advised that Rx refills may take up to 3 business days. We ask that you follow-up with your pharmacy.

## 2023-07-27 MED ORDER — OZEMPIC (0.25 OR 0.5 MG/DOSE) 2 MG/3ML ~~LOC~~ SOPN: 0.5000 mg | PEN_INJECTOR | SUBCUTANEOUS | 3 refills | Status: DC

## 2023-08-02 ENCOUNTER — Telehealth: Payer: Self-pay

## 2023-08-02 ENCOUNTER — Other Ambulatory Visit: Payer: Self-pay

## 2023-08-02 NOTE — Telephone Encounter (Signed)
 Received a refill request for Carvedilol  medication from pt's pharmacy.  Pt has not been seen for greater than 1 year. Last office visit note follow up plan was 3 months. Attempted to call patient to schedule appointment and discuss medicines, but was unable to reach. Left voicemail message requesting call back.

## 2023-08-03 ENCOUNTER — Telehealth: Payer: Self-pay | Admitting: Family

## 2023-08-03 ENCOUNTER — Other Ambulatory Visit: Payer: Self-pay

## 2023-08-03 MED ORDER — CARVEDILOL 3.125 MG PO TABS: 3.1250 mg | ORAL_TABLET | Freq: Two times a day (BID) | ORAL | 0 refills | Status: AC

## 2023-08-03 NOTE — Progress Notes (Signed)
 Medication refill sent to pharmacy for 30 day supply. Note sent to pharmacy that pt needs to be seen for further refills.

## 2023-08-05 DIAGNOSIS — H2511 Age-related nuclear cataract, right eye: Secondary | ICD-10-CM | POA: Diagnosis not present

## 2023-08-11 ENCOUNTER — Other Ambulatory Visit: Payer: Self-pay

## 2023-08-11 DIAGNOSIS — E119 Type 2 diabetes mellitus without complications: Secondary | ICD-10-CM

## 2023-08-11 DIAGNOSIS — H2511 Age-related nuclear cataract, right eye: Secondary | ICD-10-CM | POA: Diagnosis not present

## 2023-09-23 ENCOUNTER — Ambulatory Visit: Payer: 59 | Admitting: Nurse Practitioner

## 2023-09-23 ENCOUNTER — Encounter: Payer: Self-pay | Admitting: Nurse Practitioner

## 2023-09-23 VITALS — BP 128/72 | HR 80 | Resp 16 | Ht 62.0 in | Wt 184.2 lb

## 2023-09-23 DIAGNOSIS — E559 Vitamin D deficiency, unspecified: Secondary | ICD-10-CM

## 2023-09-23 DIAGNOSIS — E785 Hyperlipidemia, unspecified: Secondary | ICD-10-CM

## 2023-09-23 DIAGNOSIS — Z7985 Long-term (current) use of injectable non-insulin antidiabetic drugs: Secondary | ICD-10-CM | POA: Diagnosis not present

## 2023-09-23 DIAGNOSIS — I10 Essential (primary) hypertension: Secondary | ICD-10-CM

## 2023-09-23 DIAGNOSIS — E119 Type 2 diabetes mellitus without complications: Secondary | ICD-10-CM

## 2023-09-23 DIAGNOSIS — I5020 Unspecified systolic (congestive) heart failure: Secondary | ICD-10-CM | POA: Diagnosis not present

## 2023-09-23 LAB — COMPREHENSIVE METABOLIC PANEL WITH GFR
ALT: 10 U/L (ref 0–35)
AST: 16 U/L (ref 0–37)
Albumin: 4.2 g/dL (ref 3.5–5.2)
Alkaline Phosphatase: 53 U/L (ref 39–117)
BUN: 14 mg/dL (ref 6–23)
CO2: 27 meq/L (ref 19–32)
Calcium: 8.7 mg/dL (ref 8.4–10.5)
Chloride: 104 meq/L (ref 96–112)
Creatinine, Ser: 0.58 mg/dL (ref 0.40–1.20)
GFR: 90.53 mL/min (ref >60.00)
Glucose, Bld: 113 mg/dL — ABNORMAL HIGH (ref 70–99)
Potassium: 4.4 meq/L (ref 3.5–5.1)
Sodium: 138 meq/L (ref 135–145)
Total Bilirubin: 1.2 mg/dL (ref 0.2–1.2)
Total Protein: 6.6 g/dL (ref 6.0–8.3)

## 2023-09-23 LAB — VITAMIN D 25 HYDROXY (VIT D DEFICIENCY, FRACTURES): VITD: 31.51 ng/mL (ref 30.00–100.00)

## 2023-09-23 LAB — HEMOGLOBIN A1C: Hgb A1c MFr Bld: 6.1 % (ref 4.6–6.5)

## 2023-09-23 NOTE — Progress Notes (Unsigned)
 Leron Glance, NP-C Phone: [phone]  Erika Perry is a 73 y.o. female who presents today for follow up.   Discussed the use of AI scribe software for clinical note transcription with the patient, who gave verbal consent to proceed.  History of Present Illness   Erika Perry is a 73 year old female with diabetes and heart failure who presents for a six-month follow-up.  She is currently managing her diabetes with Ozempic  0.5 mg once weekly and metformin . Her blood sugar levels were 158 mg/dL last night and 859 mg/dL this morning, typically running in the 120s. She has not experienced hypoglycemia, polydipsia, or polyuria. Her last A1c was 5.4.  For heart failure, she is on carvedilol  twice daily and spironolactone  half tablet. She previously used Entresto  and Farxiga  but discontinued them following a urinary tract infection. She has not scheduled a follow-up with cardiology. She reports no chest pain, dyspnea, dizziness, or peripheral edema.  She completed a high-dose vitamin D  regimen once weekly. She continues to take Crestor  for cholesterol management.      Social History   Tobacco Use  Smoking Status Never  Smokeless Tobacco Never    Current Outpatient Medications on File Prior to Visit  Medication Sig Dispense Refill   Blood Glucose Monitoring Suppl DEVI 1 each by Does not apply route daily before breakfast. May substitute to any manufacturer covered by patient's insurance. 1 each 0   carvedilol  (COREG ) 3.125 MG tablet Take 1 tablet (3.125 mg total) by mouth 2 (two) times daily with a meal. 30 tablet 0   glucose blood (ACCU-CHEK GUIDE) test strip 1 each daily before breakfast. May substitute to any manufacturer covered by patient's insurance. - Does not apply 100 each 12   Lancets Misc. MISC 1 each by Does not apply route daily. May substitute to any manufacturer covered by patient's insurance. 100 each 0   rosuvastatin  (CRESTOR ) 20 MG tablet TAKE 1 TABLET(20 MG) BY MOUTH  DAILY 90 tablet 3   Semaglutide ,0.25 or 0.5MG /DOS, (OZEMPIC , 0.25 OR 0.5 MG/DOSE,) 2 MG/3ML SOPN Inject 0.5 mg into the skin once a week. 3 mL 3   spironolactone  (ALDACTONE ) 25 MG tablet TAKE 1/2 TABLET(12.5 MG) BY MOUTH DAILY 45 tablet 3   [DISCONTINUED] atorvastatin  (LIPITOR) 10 MG tablet Take 1 tablet (10 mg total) by mouth daily. 90 tablet 1   No current facility-administered medications on file prior to visit.     ROS see history of present illness  Objective  Physical Exam Vitals:   09/23/23 0951  BP: 128/72  Pulse: 80  Resp: 16  SpO2: 98%    BP Readings from Last 3 Encounters:  09/23/23 128/72  03/26/23 120/78  09/23/22 122/74   Wt Readings from Last 3 Encounters:  09/23/23 184 lb 3.2 oz (83.6 kg)  05/31/23 174 lb (78.9 kg)  03/26/23 174 lb 12.8 oz (79.3 kg)    Physical Exam Constitutional:      General: She is not in acute distress.    Appearance: Normal appearance.  HENT:     Head: Normocephalic.  Cardiovascular:     Rate and Rhythm: Normal rate and regular rhythm.     Heart sounds: Normal heart sounds.  Pulmonary:     Effort: Pulmonary effort is normal.     Breath sounds: Normal breath sounds.  Skin:    General: Skin is warm and dry.  Neurological:     General: No focal deficit present.     Mental Status: She is alert.  Psychiatric:        Mood and Affect: Mood normal.        Behavior: Behavior normal.      Assessment/Plan: Please see individual problem list.  Controlled type 2 diabetes mellitus without complication, without long-term current use of insulin  (HCC) Assessment & Plan: Managed with Ozempic , her blood sugar is generally controlled, and her last A1c was 5.4. She agreed to discontinue metformin  if control is maintained with Ozempic . Check A1c level today, discontinue metformin , and monitor blood sugar levels with Ozempic . Encourage healthy diet and remaining active.   Orders: -     Hemoglobin A1c  Hypertension, unspecified  type Assessment & Plan: Managed with carvedilol  3.125 mg twice daily and spironolactone  12.5 mg daily. There has been no recent cardiology follow-up. BP stable today in office. Continue current medication regimen. Encourage scheduling follow up with Cardiology.   Orders: -     Comprehensive metabolic panel with GFR  HFrEF (heart failure with reduced ejection fraction) (HCC) Assessment & Plan: Managed with carvedilol  and spironolactone . Patient self-discontinued Entresto  and Farxiga  several months ago. There has been no recent cardiology follow-up. Euvolemic on exam. Encouraged patient to schedule follow up with cardiology.    Hyperlipidemia, unspecified hyperlipidemia type Assessment & Plan: Managed with Crestor . Continue.    Vitamin D  deficiency Assessment & Plan: She completed a high-dose vitamin D  course. The importance of vitamin D  for bone health is noted. Check vitamin D  level today and switch to a daily over-the-counter vitamin D  supplement.   Orders: -     VITAMIN D  25 Hydroxy (Vit-D Deficiency, Fractures)     Return in about 6 months (around 03/25/2024) for Follow up.   Leron Glance, NP-C [facility]

## 2023-09-28 ENCOUNTER — Ambulatory Visit: Payer: Self-pay | Admitting: Nurse Practitioner

## 2023-10-01 ENCOUNTER — Encounter: Payer: Self-pay | Admitting: Nurse Practitioner

## 2023-10-01 NOTE — Assessment & Plan Note (Signed)
 Managed with Crestor . Continue.

## 2023-10-01 NOTE — Assessment & Plan Note (Signed)
 She completed a high-dose vitamin D  course. The importance of vitamin D  for bone health is noted. Check vitamin D  level today and switch to a daily over-the-counter vitamin D  supplement.

## 2023-10-01 NOTE — Assessment & Plan Note (Signed)
 Managed with Ozempic , her blood sugar is generally controlled, and her last A1c was 5.4. She agreed to discontinue metformin  if control is maintained with Ozempic . Check A1c level today, discontinue metformin , and monitor blood sugar levels with Ozempic . Encourage healthy diet and remaining active.

## 2023-10-01 NOTE — Assessment & Plan Note (Signed)
 Managed with carvedilol  3.125 mg twice daily and spironolactone  12.5 mg daily. There has been no recent cardiology follow-up. BP stable today in office. Continue current medication regimen. Encourage scheduling follow up with Cardiology.

## 2023-10-01 NOTE — Assessment & Plan Note (Signed)
 Managed with carvedilol  and spironolactone . Patient self-discontinued Entresto  and Farxiga  several months ago. There has been no recent cardiology follow-up. Euvolemic on exam. Encouraged patient to schedule follow up with cardiology.

## 2023-11-29 ENCOUNTER — Other Ambulatory Visit: Payer: Self-pay | Admitting: Nurse Practitioner

## 2023-11-29 DIAGNOSIS — E785 Hyperlipidemia, unspecified: Secondary | ICD-10-CM

## 2023-11-29 DIAGNOSIS — E1165 Type 2 diabetes mellitus with hyperglycemia: Secondary | ICD-10-CM

## 2023-11-29 MED ORDER — ROSUVASTATIN CALCIUM 20 MG PO TABS
20.0000 mg | ORAL_TABLET | Freq: Every day | ORAL | 2 refills | Status: AC
Start: 2023-11-29 — End: ?

## 2023-11-29 MED ORDER — OZEMPIC (0.25 OR 0.5 MG/DOSE) 2 MG/3ML ~~LOC~~ SOPN: 0.5000 mg | PEN_INJECTOR | SUBCUTANEOUS | 3 refills | Status: AC

## 2023-11-29 NOTE — Telephone Encounter (Signed)
 Copied from CRM #[id number]. Topic: Clinical - Medication Refill >> Nov 29, 2023 10:04 AM Thersia C wrote: Medication: Semaglutide ,0.25 or 0.5MG /DOS, (OZEMPIC , 0.25 OR 0.5 MG/DOSE,) 2 MG/3ML SOPN  rosuvastatin  (CRESTOR ) 20 MG tablet    Has the patient contacted their pharmacy? Yes (Agent: If no, request that the patient contact the pharmacy for the refill. If patient does not wish to contact the pharmacy document the reason why and proceed with request.) (Agent: If yes, when and what did the pharmacy advise?)  This is the patient's preferred pharmacy:  [id number] [place], [place] - [address] AT [facility] OF [address] [place] [address] [place] [address] Phone: [phone] Fax: [phone]  Is this the correct pharmacy for this prescription? No If no, delete pharmacy and type the correct one.   Has the prescription been filled recently? Yes  Is the patient out of the medication? Yes  Has the patient been seen for an appointment in the last year OR does the patient have an upcoming appointment? Yes  Can we respond through MyChart? Yes  Agent: Please be advised that Rx refills may take up to 3 business days. We ask that you follow-up with your pharmacy.

## 2023-12-06 NOTE — Progress Notes (Signed)
 Erika Perry                                          MRN: [id number]   12/06/2023   The [facility] Quality Team Specialist reviewed this patient medical record for the purposes of chart review for care gap closure. The following were reviewed: abstraction for care gap closure-glycemic status assessment.    [facility] Quality Team

## 2023-12-06 NOTE — Progress Notes (Signed)
 Erika Perry                                          MRN: [id number]   12/06/2023   The [facility] Quality Team Specialist reviewed this patient medical record for the purposes of chart review for care gap closure. The following were reviewed: chart review for care gap closure-breast cancer screening, colorectal cancer screening, and kidney health evaluation for diabetes:eGFR  and uACR.    [facility] Quality Team

## 2023-12-24 ENCOUNTER — Other Ambulatory Visit: Payer: Self-pay

## 2024-01-02 IMAGING — RF DG ELBOW COMPLETE 3+V*R*
1 series · 9 of 9 positions shown · non-contrast
Comparison: None

Fluoroscopy time: 1 minute 54 seconds

Dose: 3.26 mGy

Images: 9

CLINICAL DATA: ORIF distal RIGHT humeral fracture

EXAM:
RIGHT ELBOW - COMPLETE 3+ VIEW

[Series 1: run · 9 of 9 slices shown]
[im 1/9]
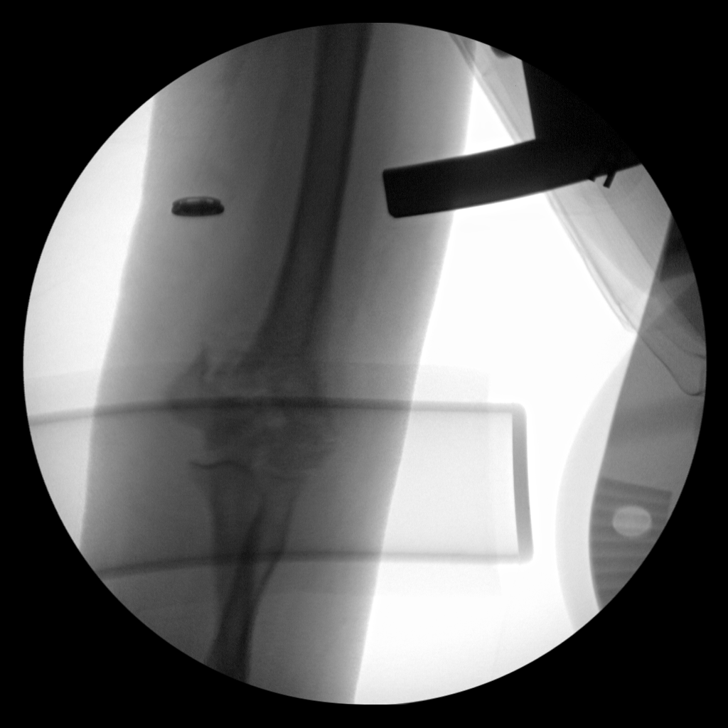
[im 2/9]
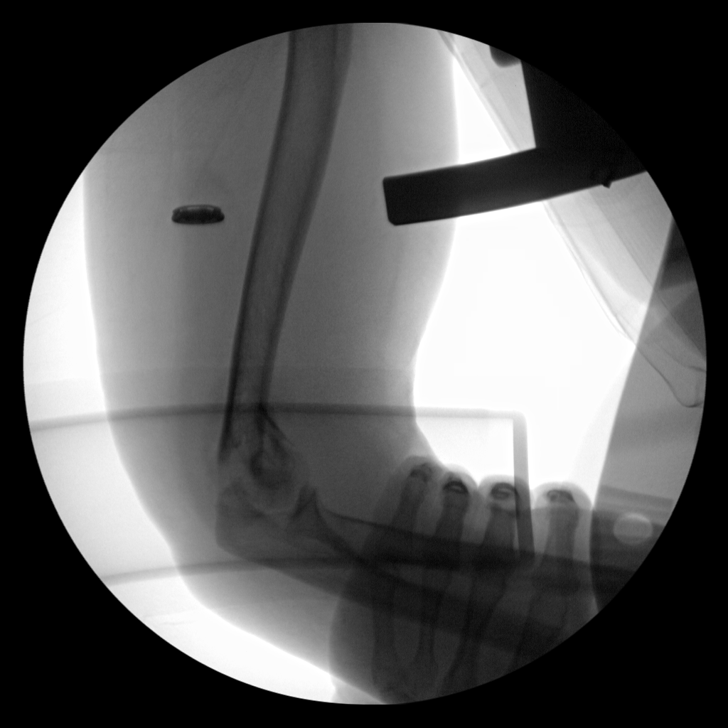
[im 3/9]
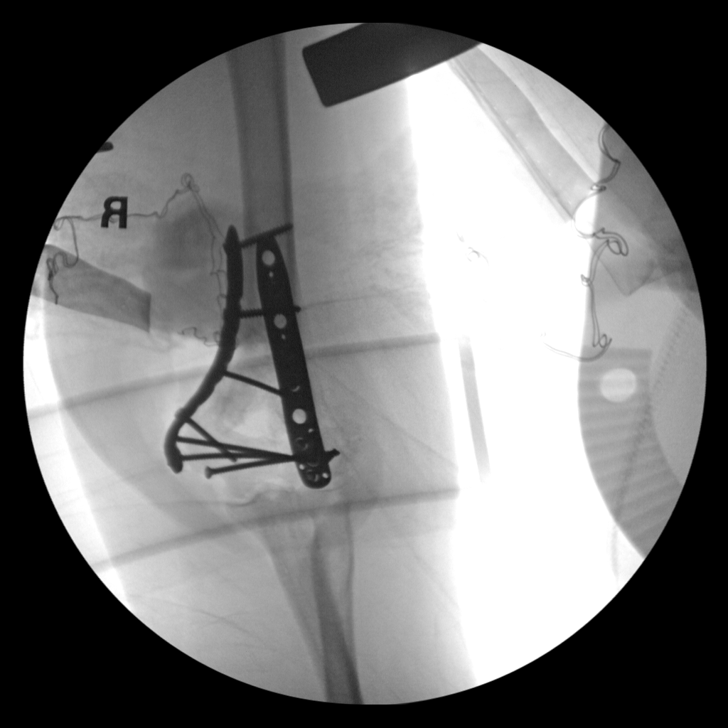
[im 4/9]
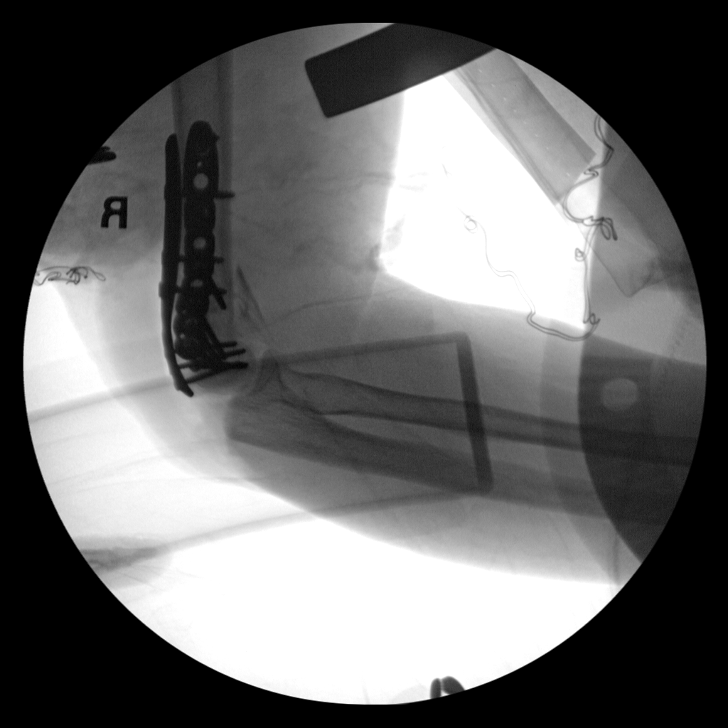
[im 5/9]
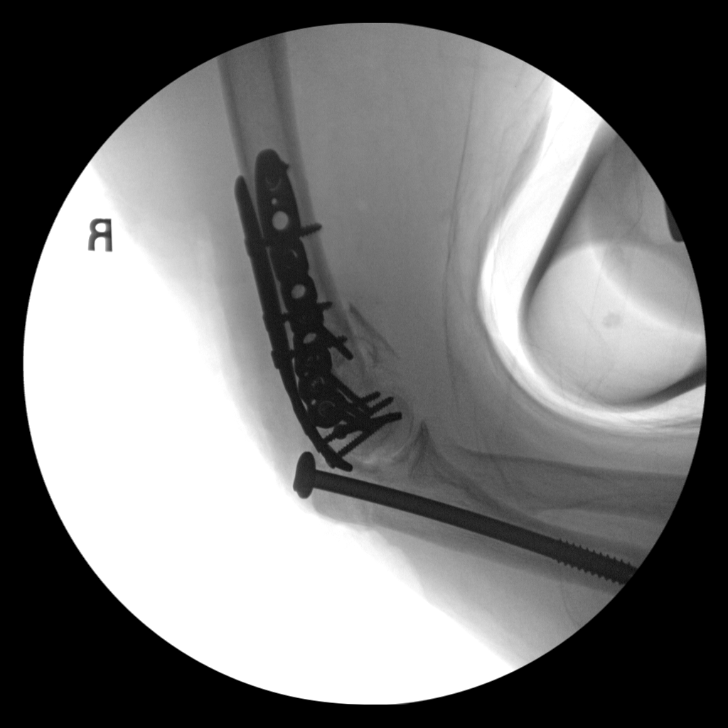
[im 6/9]
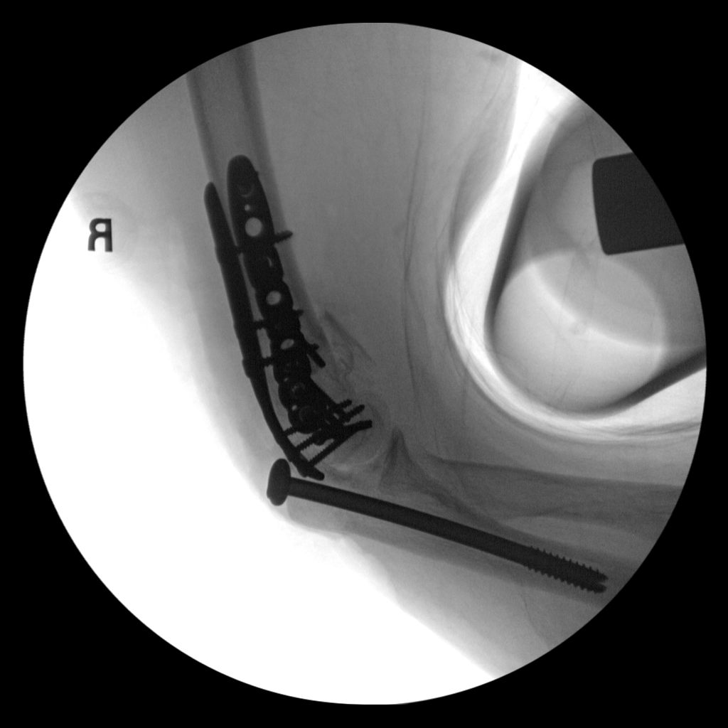
[im 7/9]
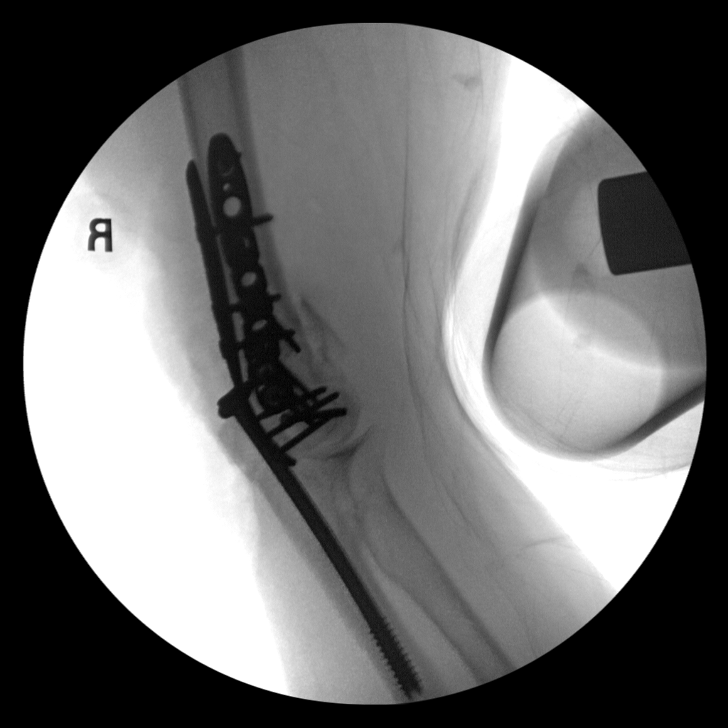
[im 8/9]
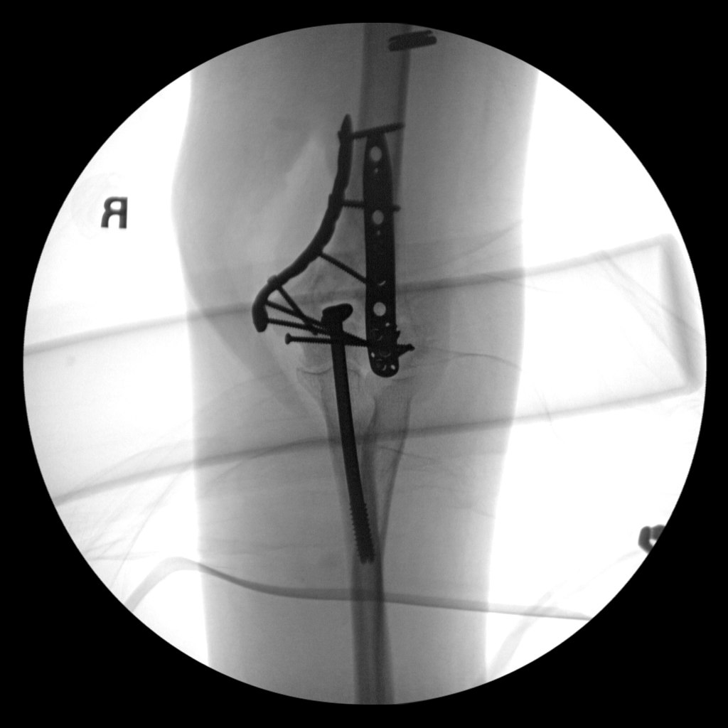
[im 9/9]
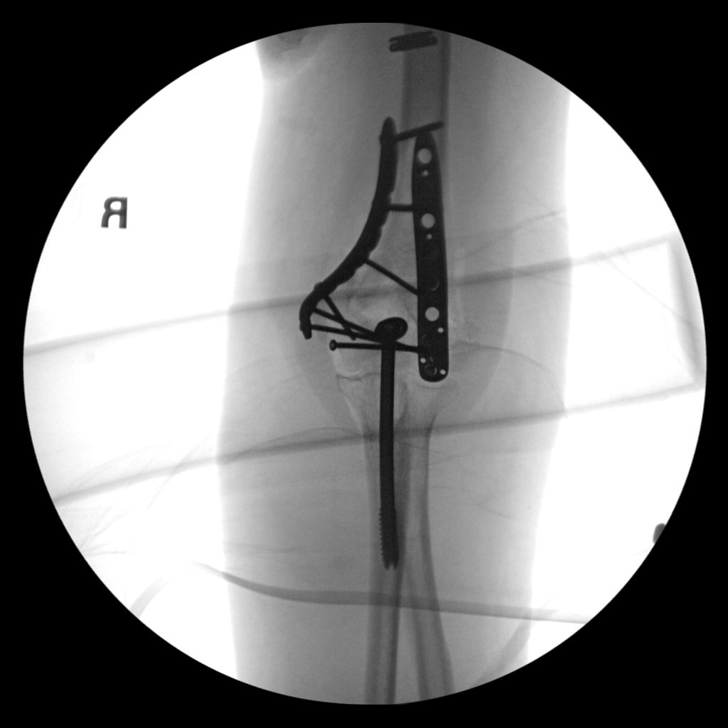

[9 of 9 positions shown; findings below may reference images not displayed]

FINDINGS: Single cannulated screw placed at proximal ulna across a reduced
olecranon fragment.

Images demonstrate placement of 2 plates and multiple screws at the
distal RIGHT humerus post ORIF of a reduced distal humeral fracture.

Limited visualization of radius but grossly intact.

No dislocation.
IMPRESSION: Post ORIF of distal RIGHT humeral and olecranon fractures.

## 2024-01-02 IMAGING — DX DG ELBOW 2V*R*
1 series · 2 of 2 positions shown · non-contrast
Comparison: Same day elbow radiograph dated July 11, 2021

CLINICAL DATA: Postop

EXAM:
RIGHT ELBOW - 2 VIEW

[Series 1: elbow · 0.14mm/px · 2 of 2 slices shown]
[im 1/2]
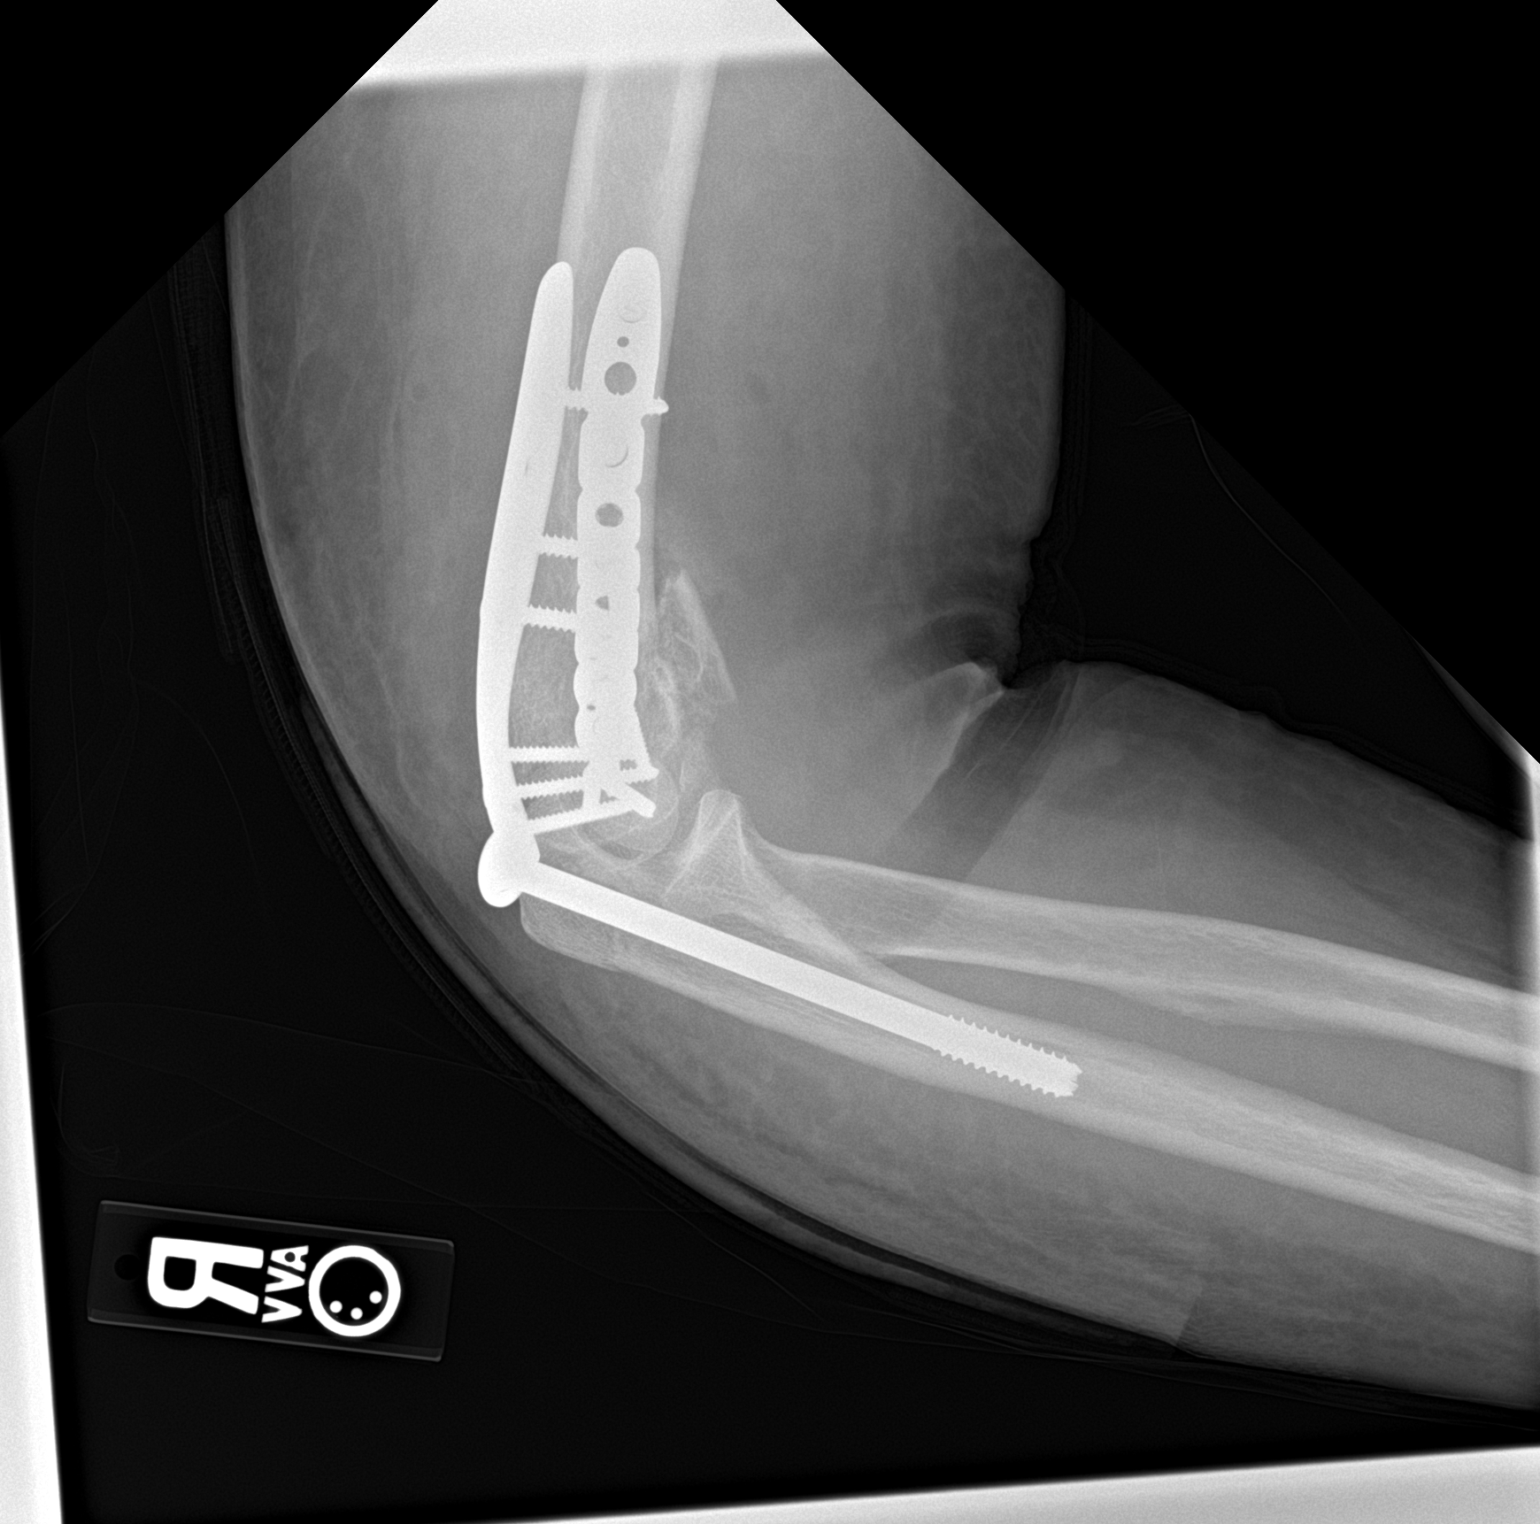
[im 2/2]
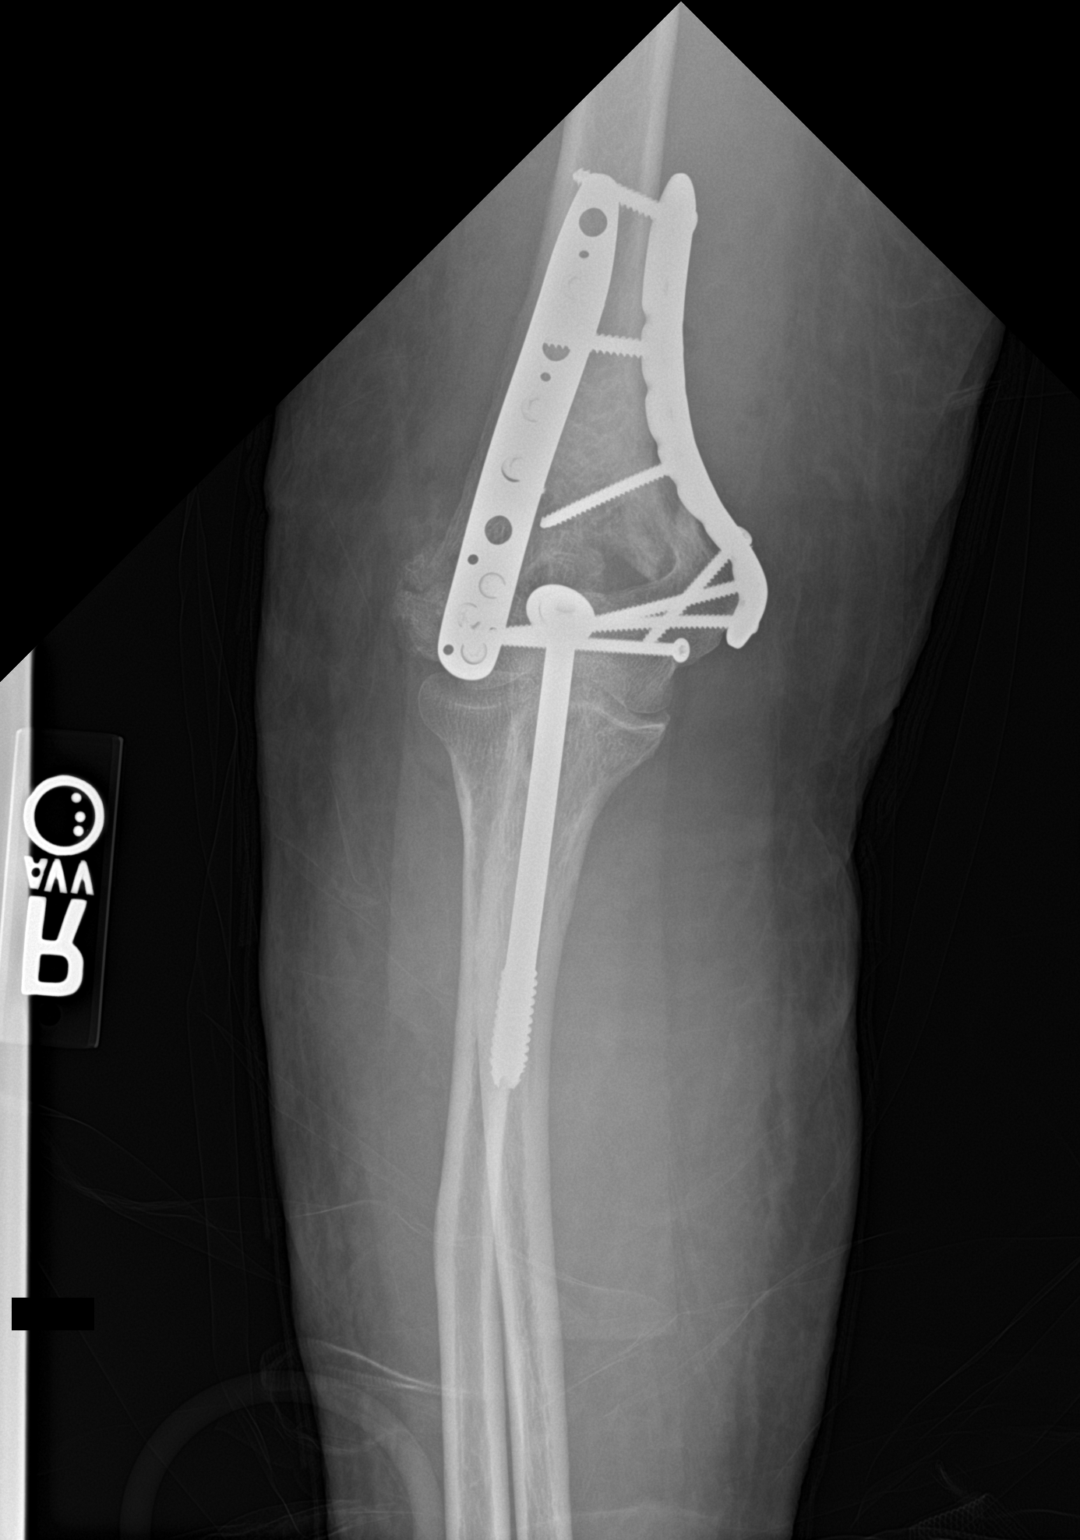

[2 of 2 positions shown; findings below may reference images not displayed]

FINDINGS: Postoperative changes of ORIF of distal humeral fracture with
multiple plates and screws. No new periprosthetic fracture. Soft
tissue swelling about the elbow.
IMPRESSION: Expected postsurgical changes of ORIF of distal humeral fracture.

## 2024-02-29 LAB — OPHTHALMOLOGY REPORT-SCANNED

## 2024-03-28 ENCOUNTER — Ambulatory Visit: Admitting: Nurse Practitioner

## 2024-06-02 ENCOUNTER — Ambulatory Visit
